# Patient Record
Sex: Male | Born: 1940 | Race: White | Hispanic: No | Marital: Married | Smoking: Former smoker
Health system: Southern US, Community
[De-identification: ages and names within clinical notes are randomized; demographics above are authoritative.]

## PROBLEM LIST (undated history)

## (undated) DIAGNOSIS — A419 Sepsis, unspecified organism: Secondary | ICD-10-CM

## (undated) DIAGNOSIS — I509 Heart failure, unspecified: Secondary | ICD-10-CM

## (undated) DIAGNOSIS — N1330 Unspecified hydronephrosis: Secondary | ICD-10-CM

## (undated) DIAGNOSIS — J101 Influenza due to other identified influenza virus with other respiratory manifestations: Secondary | ICD-10-CM

## (undated) DIAGNOSIS — R1310 Dysphagia, unspecified: Secondary | ICD-10-CM

## (undated) DIAGNOSIS — R197 Diarrhea, unspecified: Secondary | ICD-10-CM

## (undated) DIAGNOSIS — N10 Acute tubulo-interstitial nephritis: Secondary | ICD-10-CM

## (undated) DIAGNOSIS — N200 Calculus of kidney: Secondary | ICD-10-CM

## (undated) DIAGNOSIS — N179 Acute kidney failure, unspecified: Secondary | ICD-10-CM

## (undated) DIAGNOSIS — J189 Pneumonia, unspecified organism: Secondary | ICD-10-CM

## (undated) DIAGNOSIS — R6521 Severe sepsis with septic shock: Secondary | ICD-10-CM

## (undated) DIAGNOSIS — I82409 Acute embolism and thrombosis of unspecified deep veins of unspecified lower extremity: Secondary | ICD-10-CM

## (undated) DIAGNOSIS — E876 Hypokalemia: Secondary | ICD-10-CM

## (undated) DIAGNOSIS — S37009A Unspecified injury of unspecified kidney, initial encounter: Secondary | ICD-10-CM

## (undated) DIAGNOSIS — A0472 Enterocolitis due to Clostridium difficile, not specified as recurrent: Secondary | ICD-10-CM

## (undated) DIAGNOSIS — R4182 Altered mental status, unspecified: Secondary | ICD-10-CM

## (undated) DIAGNOSIS — E119 Type 2 diabetes mellitus without complications: Secondary | ICD-10-CM

## (undated) DIAGNOSIS — I10 Essential (primary) hypertension: Secondary | ICD-10-CM

## (undated) DIAGNOSIS — M549 Dorsalgia, unspecified: Secondary | ICD-10-CM

## (undated) DIAGNOSIS — K5900 Constipation, unspecified: Secondary | ICD-10-CM

## (undated) DIAGNOSIS — F0390 Unspecified dementia without behavioral disturbance: Secondary | ICD-10-CM

## (undated) HISTORY — PX: BACK SURGERY: SHX140

---

## 2013-01-05 ENCOUNTER — Emergency Department (HOSPITAL_COMMUNITY): Payer: Medicare Other

## 2013-01-05 ENCOUNTER — Inpatient Hospital Stay (HOSPITAL_COMMUNITY)
Admission: EM | Admit: 2013-01-05 | Discharge: 2013-01-13 | DRG: 871 | Disposition: A | Discharge status: Skilled Nursing Facility | Payer: Medicare Other | Attending: Family Medicine | Admitting: Family Medicine

## 2013-01-05 ENCOUNTER — Encounter (HOSPITAL_COMMUNITY): Payer: Medicare Other | Admitting: Certified Registered Nurse Anesthetist

## 2013-01-05 ENCOUNTER — Inpatient Hospital Stay (HOSPITAL_COMMUNITY): Payer: Medicare Other | Admitting: Certified Registered Nurse Anesthetist

## 2013-01-05 ENCOUNTER — Inpatient Hospital Stay (HOSPITAL_COMMUNITY): Payer: Medicare Other

## 2013-01-05 ENCOUNTER — Encounter (HOSPITAL_COMMUNITY)
Admission: EM | Disposition: A | Discharge status: Skilled Nursing Facility | Payer: Self-pay | Source: Home / Self Care | Attending: Internal Medicine

## 2013-01-05 ENCOUNTER — Encounter (HOSPITAL_COMMUNITY): Payer: Self-pay | Admitting: Emergency Medicine

## 2013-01-05 DIAGNOSIS — I509 Heart failure, unspecified: Secondary | ICD-10-CM | POA: Diagnosis present

## 2013-01-05 DIAGNOSIS — J9600 Acute respiratory failure, unspecified whether with hypoxia or hypercapnia: Secondary | ICD-10-CM | POA: Diagnosis present

## 2013-01-05 DIAGNOSIS — E872 Acidosis, unspecified: Secondary | ICD-10-CM | POA: Diagnosis present

## 2013-01-05 DIAGNOSIS — R197 Diarrhea, unspecified: Secondary | ICD-10-CM

## 2013-01-05 DIAGNOSIS — R1310 Dysphagia, unspecified: Secondary | ICD-10-CM | POA: Diagnosis present

## 2013-01-05 DIAGNOSIS — I5041 Acute combined systolic (congestive) and diastolic (congestive) heart failure: Secondary | ICD-10-CM

## 2013-01-05 DIAGNOSIS — I82409 Acute embolism and thrombosis of unspecified deep veins of unspecified lower extremity: Secondary | ICD-10-CM

## 2013-01-05 DIAGNOSIS — N200 Calculus of kidney: Secondary | ICD-10-CM

## 2013-01-05 DIAGNOSIS — N10 Acute tubulo-interstitial nephritis: Secondary | ICD-10-CM

## 2013-01-05 DIAGNOSIS — R6520 Severe sepsis without septic shock: Secondary | ICD-10-CM | POA: Diagnosis present

## 2013-01-05 DIAGNOSIS — Z881 Allergy status to other antibiotic agents status: Secondary | ICD-10-CM

## 2013-01-05 DIAGNOSIS — N179 Acute kidney failure, unspecified: Secondary | ICD-10-CM

## 2013-01-05 DIAGNOSIS — Z7901 Long term (current) use of anticoagulants: Secondary | ICD-10-CM

## 2013-01-05 DIAGNOSIS — N1330 Unspecified hydronephrosis: Secondary | ICD-10-CM

## 2013-01-05 DIAGNOSIS — Z96619 Presence of unspecified artificial shoulder joint: Secondary | ICD-10-CM

## 2013-01-05 DIAGNOSIS — I82509 Chronic embolism and thrombosis of unspecified deep veins of unspecified lower extremity: Secondary | ICD-10-CM | POA: Diagnosis present

## 2013-01-05 DIAGNOSIS — I5031 Acute diastolic (congestive) heart failure: Secondary | ICD-10-CM

## 2013-01-05 DIAGNOSIS — A0472 Enterocolitis due to Clostridium difficile, not specified as recurrent: Secondary | ICD-10-CM

## 2013-01-05 DIAGNOSIS — I129 Hypertensive chronic kidney disease with stage 1 through stage 4 chronic kidney disease, or unspecified chronic kidney disease: Secondary | ICD-10-CM | POA: Diagnosis present

## 2013-01-05 DIAGNOSIS — Z880 Allergy status to penicillin: Secondary | ICD-10-CM

## 2013-01-05 DIAGNOSIS — J101 Influenza due to other identified influenza virus with other respiratory manifestations: Secondary | ICD-10-CM

## 2013-01-05 DIAGNOSIS — I5042 Chronic combined systolic (congestive) and diastolic (congestive) heart failure: Secondary | ICD-10-CM | POA: Diagnosis present

## 2013-01-05 DIAGNOSIS — R4182 Altered mental status, unspecified: Secondary | ICD-10-CM

## 2013-01-05 DIAGNOSIS — G9341 Metabolic encephalopathy: Secondary | ICD-10-CM | POA: Diagnosis present

## 2013-01-05 DIAGNOSIS — N12 Tubulo-interstitial nephritis, not specified as acute or chronic: Secondary | ICD-10-CM | POA: Diagnosis present

## 2013-01-05 DIAGNOSIS — J189 Pneumonia, unspecified organism: Secondary | ICD-10-CM

## 2013-01-05 DIAGNOSIS — F0390 Unspecified dementia without behavioral disturbance: Secondary | ICD-10-CM | POA: Diagnosis present

## 2013-01-05 DIAGNOSIS — E876 Hypokalemia: Secondary | ICD-10-CM

## 2013-01-05 DIAGNOSIS — N189 Chronic kidney disease, unspecified: Secondary | ICD-10-CM

## 2013-01-05 DIAGNOSIS — R509 Fever, unspecified: Secondary | ICD-10-CM

## 2013-01-05 DIAGNOSIS — E1129 Type 2 diabetes mellitus with other diabetic kidney complication: Secondary | ICD-10-CM

## 2013-01-05 DIAGNOSIS — Z87891 Personal history of nicotine dependence: Secondary | ICD-10-CM

## 2013-01-05 DIAGNOSIS — A419 Sepsis, unspecified organism: Principal | ICD-10-CM

## 2013-01-05 DIAGNOSIS — N058 Unspecified nephritic syndrome with other morphologic changes: Secondary | ICD-10-CM

## 2013-01-05 DIAGNOSIS — J1100 Influenza due to unidentified influenza virus with unspecified type of pneumonia: Secondary | ICD-10-CM | POA: Diagnosis present

## 2013-01-05 DIAGNOSIS — N201 Calculus of ureter: Secondary | ICD-10-CM | POA: Diagnosis present

## 2013-01-05 DIAGNOSIS — N183 Chronic kidney disease, stage 3 unspecified: Secondary | ICD-10-CM | POA: Diagnosis present

## 2013-01-05 DIAGNOSIS — R1319 Other dysphagia: Secondary | ICD-10-CM

## 2013-01-05 DIAGNOSIS — Z66 Do not resuscitate: Secondary | ICD-10-CM | POA: Diagnosis present

## 2013-01-05 HISTORY — DX: Dorsalgia, unspecified: M54.9

## 2013-01-05 HISTORY — PX: CYSTOSCOPY W/ URETERAL STENT PLACEMENT: SHX1429

## 2013-01-05 HISTORY — DX: Type 2 diabetes mellitus without complications: E11.9

## 2013-01-05 HISTORY — DX: Unspecified injury of unspecified kidney, initial encounter: S37.009A

## 2013-01-05 HISTORY — DX: Constipation, unspecified: K59.00

## 2013-01-05 HISTORY — DX: Essential (primary) hypertension: I10

## 2013-01-05 HISTORY — DX: Acute embolism and thrombosis of unspecified deep veins of unspecified lower extremity: I82.409

## 2013-01-05 LAB — BASIC METABOLIC PANEL
CO2: 18 mEq/L — ABNORMAL LOW (ref 19–32)
Calcium: 8 mg/dL — ABNORMAL LOW (ref 8.4–10.5)
GFR calc non Af Amer: 20 mL/min — ABNORMAL LOW (ref >90)
Potassium: 4.9 mEq/L (ref 3.7–5.3)
Sodium: 135 mEq/L — ABNORMAL LOW (ref 137–147)

## 2013-01-05 LAB — CARBOXYHEMOGLOBIN
Carboxyhemoglobin: 1 % (ref 0.5–1.5)
Methemoglobin: 1.9 % — ABNORMAL HIGH (ref 0.0–1.5)
O2 Saturation: 72.2 %
Total hemoglobin: 9.8 g/dL — ABNORMAL LOW (ref 13.5–18.0)

## 2013-01-05 LAB — CBC
HCT: 35.9 % — ABNORMAL LOW (ref 39.0–52.0)
Hemoglobin: 11.8 g/dL — ABNORMAL LOW (ref 13.0–17.0)
Hemoglobin: 9.7 g/dL — ABNORMAL LOW (ref 13.0–17.0)
MCV: 88.9 fL (ref 78.0–100.0)
Platelets: 127 10*3/uL — ABNORMAL LOW (ref 150–400)
Platelets: 100 10*3/uL — ABNORMAL LOW (ref 150–400)
RBC: 4.04 MIL/uL — ABNORMAL LOW (ref 4.22–5.81)
RBC: 3.38 MIL/uL — ABNORMAL LOW (ref 4.22–5.81)
WBC: 6 10*3/uL (ref 4.0–10.5)
WBC: 21 10*3/uL — ABNORMAL HIGH (ref 4.0–10.5)

## 2013-01-05 LAB — STREP PNEUMONIAE URINARY ANTIGEN: Strep Pneumo Urinary Antigen: NEGATIVE

## 2013-01-05 LAB — POCT I-STAT, CHEM 8
BUN: 47 mg/dL — ABNORMAL HIGH (ref 6–23)
Chloride: 100 mEq/L (ref 96–112)
Creatinine, Ser: 2.9 mg/dL — ABNORMAL HIGH (ref 0.50–1.35)
Glucose, Bld: 156 mg/dL — ABNORMAL HIGH (ref 70–99)
Hemoglobin: 12.9 g/dL — ABNORMAL LOW (ref 13.0–17.0)
Potassium: 4 mEq/L (ref 3.7–5.3)
Sodium: 137 mEq/L (ref 137–147)
TCO2: 21 mmol/L (ref 0–100)

## 2013-01-05 LAB — COMPREHENSIVE METABOLIC PANEL
ALT: 11 U/L (ref 0–53)
AST: 16 U/L (ref 0–37)
BUN: 49 mg/dL — ABNORMAL HIGH (ref 6–23)
CO2: 21 mEq/L (ref 19–32)
Chloride: 95 mEq/L — ABNORMAL LOW (ref 96–112)
Creatinine, Ser: 2.71 mg/dL — ABNORMAL HIGH (ref 0.50–1.35)
GFR calc non Af Amer: 22 mL/min — ABNORMAL LOW (ref >90)
Sodium: 133 mEq/L — ABNORMAL LOW (ref 137–147)
Total Bilirubin: 0.5 mg/dL (ref 0.3–1.2)

## 2013-01-05 LAB — URINE MICROSCOPIC-ADD ON

## 2013-01-05 LAB — URINALYSIS, ROUTINE W REFLEX MICROSCOPIC
Bilirubin Urine: NEGATIVE
Protein, ur: 100 mg/dL — AB
Urobilinogen, UA: 1 mg/dL (ref 0.0–1.0)

## 2013-01-05 LAB — INFLUENZA PANEL BY PCR (TYPE A & B): Influenza A By PCR: POSITIVE — AB

## 2013-01-05 LAB — GLUCOSE, CAPILLARY
Glucose-Capillary: 204 mg/dL — ABNORMAL HIGH (ref 70–99)
Glucose-Capillary: 185 mg/dL — ABNORMAL HIGH (ref 70–99)
Glucose-Capillary: 185 mg/dL — ABNORMAL HIGH (ref 70–99)

## 2013-01-05 LAB — PRO B NATRIURETIC PEPTIDE: Pro B Natriuretic peptide (BNP): 8741 pg/mL — ABNORMAL HIGH (ref 0–125)

## 2013-01-05 LAB — CLOSTRIDIUM DIFFICILE BY PCR: Toxigenic C. Difficile by PCR: POSITIVE — AB

## 2013-01-05 LAB — MRSA PCR SCREENING: MRSA by PCR: NEGATIVE

## 2013-01-05 SURGERY — CYSTOSCOPY, WITH RETROGRADE PYELOGRAM AND URETERAL STENT INSERTION
Anesthesia: Monitor Anesthesia Care | Site: Ureter | Laterality: Left

## 2013-01-05 MED ORDER — OSELTAMIVIR PHOSPHATE 30 MG PO CAPS
30.0000 mg | ORAL_CAPSULE | Freq: Every day | ORAL | Status: DC
  Administered 2013-01-06 – 2013-01-07 (×2): 30 mg via ORAL
  Filled 2013-01-05 (×2): qty 1

## 2013-01-05 MED ORDER — SODIUM CHLORIDE 0.9 % IR SOLN
Status: DC | PRN
  Administered 2013-01-05: 3000 mL

## 2013-01-05 MED ORDER — APIXABAN 5 MG PO TABS: 5.0000 mg | ORAL_TABLET | Freq: Two times a day (BID) | ORAL | Status: DC

## 2013-01-05 MED ORDER — METOPROLOL SUCCINATE ER 25 MG PO TB24: 37.5000 mg | ORAL_TABLET | Freq: Every day | ORAL | Status: DC

## 2013-01-05 MED ORDER — LEVOFLOXACIN IN D5W 750 MG/150ML IV SOLN
750.0000 mg | Freq: Once | INTRAVENOUS | Status: AC
  Administered 2013-01-05: 750 mg via INTRAVENOUS
  Filled 2013-01-05: qty 150

## 2013-01-05 MED ORDER — OSELTAMIVIR PHOSPHATE 75 MG PO CAPS
75.0000 mg | ORAL_CAPSULE | Freq: Two times a day (BID) | ORAL | Status: DC
  Administered 2013-01-05: 75 mg via ORAL
  Filled 2013-01-05 (×3): qty 1

## 2013-01-05 MED ORDER — SODIUM CHLORIDE 0.9 % IV SOLN
INTRAVENOUS | Status: DC | PRN
  Administered 2013-01-05: 12:00:00 via INTRAVENOUS

## 2013-01-05 MED ORDER — MORPHINE SULFATE 2 MG/ML IJ SOLN
1.0000 mg | INTRAMUSCULAR | Status: DC | PRN
  Administered 2013-01-05 – 2013-01-10 (×14): 1 mg via INTRAVENOUS
  Filled 2013-01-05 (×14): qty 1

## 2013-01-05 MED ORDER — IOHEXOL 300 MG/ML  SOLN
50.0000 mL | Freq: Once | INTRAMUSCULAR | Status: AC | PRN
  Administered 2013-01-05: 50 mL via ORAL

## 2013-01-05 MED ORDER — FUROSEMIDE 10 MG/ML IJ SOLN
20.0000 mg | Freq: Two times a day (BID) | INTRAMUSCULAR | Status: DC
  Administered 2013-01-05 – 2013-01-13 (×16): 20 mg via INTRAVENOUS
  Filled 2013-01-05 (×18): qty 2

## 2013-01-05 MED ORDER — KETAMINE HCL 10 MG/ML IJ SOLN
INTRAMUSCULAR | Status: AC
  Filled 2013-01-05: qty 1

## 2013-01-05 MED ORDER — MIDAZOLAM HCL 5 MG/5ML IJ SOLN
INTRAMUSCULAR | Status: DC | PRN
  Administered 2013-01-05 (×2): 0.5 mg via INTRAVENOUS
  Administered 2013-01-05: 1 mg via INTRAVENOUS

## 2013-01-05 MED ORDER — SODIUM CHLORIDE 0.9 % IV SOLN
INTRAVENOUS | Status: DC
  Administered 2013-01-05: 05:00:00 via INTRAVENOUS

## 2013-01-05 MED ORDER — VANCOMYCIN HCL 10 G IV SOLR
2000.0000 mg | Freq: Once | INTRAVENOUS | Status: AC
  Administered 2013-01-05: 2000 mg via INTRAVENOUS
  Filled 2013-01-05: qty 2000

## 2013-01-05 MED ORDER — SODIUM CHLORIDE 0.9 % IV BOLUS (SEPSIS): 1000.0000 mL | INTRAVENOUS | Status: DC | PRN

## 2013-01-05 MED ORDER — MIDAZOLAM HCL 2 MG/2ML IJ SOLN
INTRAMUSCULAR | Status: AC
  Filled 2013-01-05: qty 2

## 2013-01-05 MED ORDER — SODIUM CHLORIDE 0.9 % IV BOLUS (SEPSIS)
1000.0000 mL | Freq: Once | INTRAVENOUS | Status: AC
  Administered 2013-01-05: 1000 mL via INTRAVENOUS

## 2013-01-05 MED ORDER — INSULIN ASPART 100 UNIT/ML ~~LOC~~ SOLN
0.0000 [IU] | Freq: Three times a day (TID) | SUBCUTANEOUS | Status: DC
  Administered 2013-01-05 (×2): 3 [IU] via SUBCUTANEOUS
  Administered 2013-01-06: 2 [IU] via SUBCUTANEOUS
  Administered 2013-01-07: 3 [IU] via SUBCUTANEOUS
  Administered 2013-01-08: 5 [IU] via SUBCUTANEOUS
  Administered 2013-01-08: 3 [IU] via SUBCUTANEOUS

## 2013-01-05 MED ORDER — BIOTENE DRY MOUTH MT LIQD
15.0000 mL | Freq: Two times a day (BID) | OROMUCOSAL | Status: DC
  Administered 2013-01-06 – 2013-01-13 (×10): 15 mL via OROMUCOSAL

## 2013-01-05 MED ORDER — 0.9 % SODIUM CHLORIDE (POUR BTL) OPTIME
TOPICAL | Status: DC | PRN
  Administered 2013-01-05: 1000 mL

## 2013-01-05 MED ORDER — DEXTROSE 5 % IV SOLN
1.0000 g | Freq: Three times a day (TID) | INTRAVENOUS | Status: AC
  Administered 2013-01-05 – 2013-01-13 (×24): 1 g via INTRAVENOUS
  Filled 2013-01-05 (×26): qty 1

## 2013-01-05 MED ORDER — METRONIDAZOLE IN NACL 5-0.79 MG/ML-% IV SOLN
500.0000 mg | Freq: Three times a day (TID) | INTRAVENOUS | Status: DC
  Administered 2013-01-05: 500 mg via INTRAVENOUS
  Filled 2013-01-05 (×2): qty 100

## 2013-01-05 MED ORDER — LIDOCAINE HCL 2 % EX GEL
CUTANEOUS | Status: DC | PRN
  Administered 2013-01-05: 1

## 2013-01-05 MED ORDER — ONDANSETRON HCL 4 MG/2ML IJ SOLN
INTRAMUSCULAR | Status: AC
  Filled 2013-01-05: qty 2

## 2013-01-05 MED ORDER — ONDANSETRON HCL 4 MG/2ML IJ SOLN: 4.0000 mg | Freq: Four times a day (QID) | INTRAMUSCULAR | Status: DC | PRN

## 2013-01-05 MED ORDER — SODIUM CHLORIDE 0.9 % IV SOLN
INTRAVENOUS | Status: DC
  Administered 2013-01-05: 11:00:00 via INTRAVENOUS

## 2013-01-05 MED ORDER — ONDANSETRON HCL 4 MG/2ML IJ SOLN
INTRAMUSCULAR | Status: DC | PRN
  Administered 2013-01-05: 4 mg via INTRAVENOUS

## 2013-01-05 MED ORDER — DEXAMETHASONE SODIUM PHOSPHATE 10 MG/ML IJ SOLN
INTRAMUSCULAR | Status: AC
  Filled 2013-01-05: qty 1

## 2013-01-05 MED ORDER — ONDANSETRON HCL 4 MG PO TABS: 4.0000 mg | ORAL_TABLET | Freq: Four times a day (QID) | ORAL | Status: DC | PRN

## 2013-01-05 MED ORDER — CHLORHEXIDINE GLUCONATE 0.12 % MT SOLN
15.0000 mL | Freq: Two times a day (BID) | OROMUCOSAL | Status: DC
  Administered 2013-01-05 – 2013-01-13 (×12): 15 mL via OROMUCOSAL
  Filled 2013-01-05 (×18): qty 15

## 2013-01-05 MED ORDER — VANCOMYCIN HCL 500 MG IV SOLR
500.0000 mg | Freq: Four times a day (QID) | Status: DC
  Filled 2013-01-05 (×3): qty 500

## 2013-01-05 MED ORDER — LACTATED RINGERS IV SOLN: INTRAVENOUS | Status: DC | PRN

## 2013-01-05 MED ORDER — IOHEXOL 300 MG/ML  SOLN
INTRAMUSCULAR | Status: DC | PRN
  Administered 2013-01-05: 18 mL

## 2013-01-05 MED ORDER — ONDANSETRON HCL 4 MG/2ML IJ SOLN
4.0000 mg | Freq: Once | INTRAMUSCULAR | Status: AC
Start: 2013-01-05 — End: 2013-01-05
  Administered 2013-01-05: 4 mg via INTRAVENOUS
  Filled 2013-01-05: qty 2

## 2013-01-05 MED ORDER — VANCOMYCIN 50 MG/ML ORAL SOLUTION
500.0000 mg | Freq: Four times a day (QID) | ORAL | Status: DC
  Administered 2013-01-05 – 2013-01-10 (×21): 500 mg via ORAL
  Filled 2013-01-05 (×26): qty 10

## 2013-01-05 MED ORDER — ACETAMINOPHEN 650 MG RE SUPP
650.0000 mg | Freq: Once | RECTAL | Status: AC
  Administered 2013-01-05: 650 mg via RECTAL
  Filled 2013-01-05: qty 1

## 2013-01-05 MED ORDER — METOCLOPRAMIDE HCL 5 MG/ML IJ SOLN
INTRAMUSCULAR | Status: DC | PRN
  Administered 2013-01-05: 10 mg via INTRAVENOUS

## 2013-01-05 MED ORDER — AZTREONAM 2 G IJ SOLR
2.0000 g | INTRAMUSCULAR | Status: AC
  Administered 2013-01-05: 2 g via INTRAVENOUS
  Filled 2013-01-05: qty 2

## 2013-01-05 MED ORDER — NOREPINEPHRINE BITARTRATE 1 MG/ML IJ SOLN
5.0000 ug/min | INTRAVENOUS | Status: DC
  Administered 2013-01-05: 8 ug/min via INTRAVENOUS
  Filled 2013-01-05: qty 4

## 2013-01-05 MED ORDER — DEXAMETHASONE SODIUM PHOSPHATE 4 MG/ML IJ SOLN
INTRAMUSCULAR | Status: DC | PRN
  Administered 2013-01-05: 8 mg via INTRAVENOUS

## 2013-01-05 MED ORDER — METOCLOPRAMIDE HCL 5 MG/ML IJ SOLN
INTRAMUSCULAR | Status: AC
  Filled 2013-01-05: qty 2

## 2013-01-05 MED ORDER — SODIUM CHLORIDE 0.9 % IV SOLN: INTRAVENOUS | Status: DC

## 2013-01-05 MED ORDER — METRONIDAZOLE IN NACL 5-0.79 MG/ML-% IV SOLN
500.0000 mg | Freq: Three times a day (TID) | INTRAVENOUS | Status: DC
  Administered 2013-01-05 – 2013-01-10 (×14): 500 mg via INTRAVENOUS
  Filled 2013-01-05 (×18): qty 100

## 2013-01-05 MED ORDER — PANTOPRAZOLE SODIUM 40 MG IV SOLR
40.0000 mg | INTRAVENOUS | Status: DC
  Administered 2013-01-05 – 2013-01-06 (×2): 40 mg via INTRAVENOUS
  Filled 2013-01-05 (×3): qty 40

## 2013-01-05 MED ORDER — LIDOCAINE HCL 2 % EX GEL
CUTANEOUS | Status: AC
  Filled 2013-01-05: qty 10

## 2013-01-05 MED ORDER — PROPOFOL 10 MG/ML IV BOLUS
INTRAVENOUS | Status: AC
  Filled 2013-01-05: qty 20

## 2013-01-05 MED ORDER — PROPOFOL 10 MG/ML IV BOLUS
INTRAVENOUS | Status: DC | PRN
  Administered 2013-01-05: 30 mg via INTRAVENOUS
  Administered 2013-01-05: 20 mg via INTRAVENOUS
  Administered 2013-01-05: 30 mg via INTRAVENOUS
  Administered 2013-01-05: 20 mg via INTRAVENOUS

## 2013-01-05 MED ORDER — KETAMINE HCL 10 MG/ML IJ SOLN
INTRAMUSCULAR | Status: DC | PRN
  Administered 2013-01-05: 30 mg via INTRAVENOUS
  Administered 2013-01-05 (×3): 20 mg via INTRAVENOUS
  Administered 2013-01-05: 10 mg via INTRAVENOUS

## 2013-01-05 SURGICAL SUPPLY — 16 items
BAG URINE DRAINAGE (UROLOGICAL SUPPLIES) ×2 IMPLANT
BASKET ZERO TIP NITINOL 2.4FR (BASKET) IMPLANT
CATH INTERMIT  6FR 70CM (CATHETERS) ×2 IMPLANT
DRAPE CAMERA CLOSED 9X96 (DRAPES) ×2 IMPLANT
FIBER LASER FLEXIVA 200 (UROLOGICAL SUPPLIES) IMPLANT
FIBER LASER FLEXIVA 365 (UROLOGICAL SUPPLIES) IMPLANT
GLOVE BIOGEL M STRL SZ7.5 (GLOVE) ×2 IMPLANT
GOWN STRL REIN XL XLG (GOWN DISPOSABLE) IMPLANT
GUIDE WIRE 2.5MM (WIRE) ×2
GUIDEWIRE .038 (WIRE) ×2 IMPLANT
GUIDEWIRE 2.5MM (WIRE) ×1 IMPLANT
GUIDEWIRE ANG ZIPWIRE 038X150 (WIRE) ×2 IMPLANT
GUIDEWIRE STR DUAL SENSOR (WIRE) ×2 IMPLANT
IV NS IRRIG 3000ML ARTHROMATIC (IV SOLUTION) ×2 IMPLANT
PACK CYSTO (CUSTOM PROCEDURE TRAY) ×2 IMPLANT
STENT CONTOUR 7FRX24 (STENTS) ×2 IMPLANT

## 2013-01-05 NOTE — Progress Notes (Signed)
Patient came straight from ICU to OR due to emergency case, unable to assess patient marched armband name, birth date, and MD with chart.

## 2013-01-05 NOTE — Op Note (Signed)
Preoperative diagnosis: Left ureteral calculus with urosepsis Postoperative diagnosis: Same  Procedure: Cystoscopy, left retrograde pyelogram and left double-J stent placement 7 French x24 cm   Surgeon: Valetta Fuller M.D.  Anesthesia: Gen.  Indications: Patient presented with fever, mental status changes and hypotension. He is felt to have urosepsis. Stone particle CT showed a 7 mm left midureteral stone. We folded critical at the patient had decompression of the system in order to allow for any chance of improvement for his systemic septic syndrome. This was discussed with the patient's wife.     Technique and findings: Patient was brought the operating room. He received some IV sedation. He was placed in the lithotomy position and prepped and draped in usual manner. Cystoscopy revealed cloudy urine but no other pathology. Retrograde polygrams confirmed a filling defect with high-grade obstruction and quite a tortuous left ureter. This was done with an open-ended catheter. Fluoroscopic interpretation was utilized.   At the completion of retrograde pyelography a guidewire was placed in the left renal pelvis. Given the ureteral tortuosity this was somewhat difficult we were able to get access. We then placed a 7 French 24 cm double-J stent. Marked cloudy/grossly purulent urine was obtained which appeared to be under high pressure. A new Foley catheter was placed and the patient will be brought back to the intensive. It.

## 2013-01-05 NOTE — Anesthesia Preprocedure Evaluation (Signed)
Anesthesia Evaluation  Patient identified by MRN, date of birth, ID band Patient awake  General Assessment Comment:Urosepsis with CV compromise  On pressors  Reviewed: Allergy & Precautions, H&P , NPO status , Patient's Chart, lab work & pertinent test results  Airway Mallampati: II TM Distance: >3 FB Neck ROM: Full    Dental no notable dental hx.    Pulmonary neg pulmonary ROS, former smoker,  breath sounds clear to auscultation  Pulmonary exam normal       Cardiovascular hypertension, Pt. on medications and Pt. on home beta blockers DVT Rhythm:Regular Rate:Tachycardia     Neuro/Psych negative neurological ROS  negative psych ROS   GI/Hepatic negative GI ROS, Neg liver ROS,   Endo/Other  diabetes  Renal/GU ARF and Renal InsufficiencyRenal disease  negative genitourinary   Musculoskeletal negative musculoskeletal ROS (+)   Abdominal   Peds negative pediatric ROS (+)  Hematology negative hematology ROS (+)   Anesthesia Other Findings   Reproductive/Obstetrics negative OB ROS                           Anesthesia Physical Anesthesia Plan  ASA: IV and emergent  Anesthesia Plan: MAC   Post-op Pain Management:    Induction:   Airway Management Planned: Simple Face Mask  Additional Equipment:   Intra-op Plan:   Post-operative Plan:   Informed Consent: I have reviewed the patients History and Physical, chart, labs and discussed the procedure including the risks, benefits and alternatives for the proposed anesthesia with the patient or authorized representative who has indicated his/her understanding and acceptance.   Dental advisory given  Plan Discussed with: CRNA  Anesthesia Plan Comments:         Anesthesia Quick Evaluation

## 2013-01-05 NOTE — Consult Note (Addendum)
Name: Henry Wyatt MRN: [id number] DOB: 09-12-1940    ADMISSION DATE:  01/05/2013 CONSULTATION DATE:  01/05/2013   REFERRING MD :  edp PRIMARY SERVICE: trh  CHIEF COMPLAINT:  Shock abd pain  BRIEF PATIENT DESCRIPTION:  72 y.o.M adm from snf with abd pain, septic shock  SIGNIFICANT EVENTS / STUDIES:  ABD pelvic CT 12/31:  L ureter stone with hydronephrosis  LINES / TUBES: PIV  CULTURES: BC x 2 12/31 UC 12/31 resp cult 12/31 C diff titer 12/31    ANTIBIOTICS: Aztreonam 12/31 Vanco 12/31 levaquin 12/31 tamiflu 12/31    HISTORY OF PRESENT ILLNESS:   72 y.o.M SNF resident adm with diarrhea, abd pain, dyspnea, hypotension, SIRS, L ureteral stone and hydro, PNA, fever, shock. CCM consulted.  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  Hx of HTN, CKD, DM 2.  Pt is confused and not able to give much hx.  No family present  See PMHx   PAST MEDICAL HISTORY :  Past Medical History  Diagnosis Date  . Hypertension   . Diabetes mellitus without complication   . Constipation   . DVT (deep venous thrombosis)   . Back pain   . Kidney injury    History reviewed. No pertinent past surgical history. Prior to Admission medications   Medication Sig Start Date End Date Taking? Authorizing Provider  acetaminophen (TYLENOL) 500 MG tablet Take 1,000 mg by mouth every 8 (eight) hours as needed for mild pain.   Yes Historical Provider, MD  albuterol (PROVENTIL) (2.5 MG/3ML) 0.083% nebulizer solution Take 2.5 mg by nebulization every 2 (two) hours as needed for wheezing or shortness of breath.   Yes Historical Provider, MD  apixaban (ELIQUIS) 5 MG TABS tablet Take 5 mg by mouth 2 (two) times daily.   Yes Historical Provider, MD  atorvastatin (LIPITOR) 20 MG tablet Take 20 mg by mouth daily.   Yes Historical Provider, MD  citalopram (CELEXA) 10 MG tablet Take 10 mg by mouth daily.   Yes Historical Provider, MD  cyanocobalamin 1000 MCG tablet Take 100 mcg by mouth daily.   Yes Historical  Provider, MD  divalproex (DEPAKOTE) 125 MG DR tablet Take 125 mg by mouth 3 (three) times daily.   Yes Historical Provider, MD  docusate sodium (COLACE) 100 MG capsule Take 100 mg by mouth 2 (two) times daily.   Yes Historical Provider, MD  folic acid (FOLVITE) 1 MG tablet Take 1 mg by mouth daily.   Yes Historical Provider, MD  insulin aspart (NOVOLOG) 100 UNIT/ML injection Inject 1-12 Units into the skin 3 (three) times daily before meals. Per sliding scale   Yes Historical Provider, MD  metoprolol succinate (TOPROL-XL) 25 MG 24 hr tablet Take 37.5 mg by mouth at bedtime.   Yes Historical Provider, MD  ondansetron (ZOFRAN) 4 MG tablet Take 4 mg by mouth every 8 (eight) hours as needed for nausea or vomiting.   Yes Historical Provider, MD  oxyCODONE (OXY IR/ROXICODONE) 5 MG immediate release tablet Take 5 mg by mouth every 6 (six) hours as needed for severe pain.   Yes Historical Provider, MD  pantoprazole (PROTONIX) 40 MG tablet Take 40 mg by mouth daily.   Yes Historical Provider, MD  polyethylene glycol (MIRALAX / GLYCOLAX) packet Take 17 g by mouth daily.   Yes Historical Provider, MD  valsartan (DIOVAN) 320 MG tablet Take 320 mg by mouth daily.   Yes Historical Provider, MD   Allergies  Allergen Reactions  . Carbapenems Other (See  Comments)    Per [facility] home [facility]  . Cephalosporins Other (See Comments)    Per [facility] home [facility]  . Erythromycin Other (See Comments)    Per [facility] home [facility]  . Penicillins Other (See Comments)    Per [facility] home [facility]  . Sulfa Antibiotics Other (See Comments)    Per nursing home [facility]    FAMILY HISTORY:  No family history on file. SOCIAL HISTORY:  reports that he has quit smoking. He does not have any smokeless tobacco history on file. He reports that he does not drink alcohol or use illicit drugs.  REVIEW OF SYSTEMS:  Not obtainable d/t mental status  SUBJECTIVE:   VITAL SIGNS: Temp:  [103.4 F (39.7 C)-104.5 F (40.3 C)] 103.8 F (39.9 C) (12/31  0659) Pulse Rate:  [101-106] 101 (12/31 0857) Resp:  [13-32] 13 (12/31 0857) BP: (72-115)/(37-85) 72/37 mmHg (12/31 0857) SpO2:  [92 %-99 %] 96 % (12/31 0857) Weight:  [102.1 kg (225 lb 1.4 oz)] 102.1 kg (225 lb 1.4 oz) (12/31 0857) HEMODYNAMICS: Shock state   VENTILATOR SETTINGS: [place] oxygen   INTAKE / OUTPUT: Intake/Output   None     PHYSICAL EXAMINATION: General:  Ill appearing WM disheveled  Neuro:  Altered ,confused HEENT:  Dry mucus membranes Cardiovascular:  Tachy nl s1 s2 no s3 s4 Lungs:  Rales at bases Abdomen:  Soft NT  Musculoskeletal:  From  Skin:  clear  LABS:  CBC  Recent Labs Lab 01/05/13 0440 01/05/13 0451  WBC 6.0  --   HGB 11.8* 12.9*  HCT 35.9* 38.0*  PLT 127*  --    Coag's No results found for this basename: APTT, INR,  in the last 168 hours BMET  Recent Labs Lab 01/05/13 0440 01/05/13 0451  NA 133* 137  K 3.9 4.0  CL 95* 100  CO2 21  --   BUN 49* 47*  CREATININE 2.71* 2.90*  GLUCOSE 156* 156*   Electrolytes  Recent Labs Lab 01/05/13 0440  CALCIUM 8.7   Sepsis Markers  Recent Labs Lab 01/05/13 0449  LATICACIDVEN 4.83*   ABG No results found for this basename: PHART, PCO2ART, PO2ART,  in the last 168 hours Liver Enzymes  Recent Labs Lab 01/05/13 0440  AST 16  ALT 11  ALKPHOS 166*  BILITOT 0.5  ALBUMIN 2.8*   Cardiac Enzymes No results found for this basename: TROPONINI, PROBNP,  in the last 168 hours Glucose  Recent Labs Lab 01/05/13 0900  GLUCAP 204*    Imaging Ct Abdomen Pelvis Wo Contrast  01/05/2013   CLINICAL DATA:  72 year old male with fever and abdominal pain. Renal insufficiency precludes IV contrast. Initial encounter.  EXAM: CT ABDOMEN AND PELVIS WITHOUT CONTRAST  TECHNIQUE: Multidetector CT imaging of the abdomen and pelvis was performed following the standard protocol without intravenous contrast.  COMPARISON:  Portable abdomen radiographs from the same day.  FINDINGS: Mild motion artifact.  Confluent left lower lobe and posterior lingula pulmonary opacity, somewhat beyond that typically seen for simple atelectasis. Cardiomegaly. No pericardial or pleural effusion. Mild atelectasis at the right lung base.  Chronic appearing right lower posterior rib fractures. Scoliosis and advanced degenerative changes in the spine. Previous lumbosacral junction posterior and interbody fusion. Previous partial laminectomy in the upper lumbar spine. No acute osseous abnormality identified.  Mild presacral stranding (series 2, image 77) contiguous with inflammation in the retroperitoneal space along the left pelvic sidewall tracking to the lower abdomen. Inflammation tracks along the left gutter and there is  thickening of the left para renal fascia.  There is in a regular 7 x 7 mm obstructing calculus in the left ureter at the L4-L5 disc level. There is upstream hydroureter and periureteral stranding. There is left hydronephrosis with a subtle fluid fluid level in the left hemipelvis (series 2, image 40). There are additional nonobstructing left intra renal calculi. Left perinephric stranding is moderate.  Beyond the obstruction, the distal left ureter appears normal. The bladder is within normal limits. Right nephrolithiasis without obstruction.  There is mild wall thickening of the distal colon, most pronounced in the sigmoid. There are sigmoid diverticula. Still, this segment is under distended. The left colon is also decompressed with diverticula.  More normal appearance of the transverse colon and right: . Appendix appears to be surgically absent. No dilated small bowel. Negative stomach and duodenum. Negative non contrast liver, gallbladder, spleen, pancreas and right adrenal gland. There is a 3 cm left adrenal low density nodule (12 Hounsfield units). No abdominal free fluid or free air.  IMPRESSION: 1. Acute obstructive uropathy on the left, with a 7 x 7 mm calculus at the L4-L5 disc level. Moderate to severe  perinephric and periureteric stranding plus a fluid-fluid level in the left renal pelvis (may reflect hematuria).  2. Stranding along the left pericolic gutter and retroperitoneal space tracking to the presacral space all felt to be related to #1. There is diverticulosis of the sigmoid and descending colon, but I do not favor superimposed colitis at this time.  3. Confluent opacity at the left lung base, favor atelectasis in light of #1, but developing left lung base infection difficult to exclude.  4.  Additional bilateral nephrolithiasis.  5. Low-density 3 cm left adrenal nodule, favor benign adenoma.   Electronically Signed   By: Augusto Gamble M.D.   On: 01/05/2013 08:56   Dg Chest Portable 1 View  01/05/2013   CLINICAL DATA:  Fever of 104.  Diarrhea and confusion.  EXAM: PORTABLE CHEST - 1 VIEW  COMPARISON:  None.  FINDINGS: Shallow inspiration. Heart size and pulmonary vascularity are probably normal for technique. There is suggestion of ulceration 0 left hemidiaphragm suggesting a small pleural effusion and basilar infiltration. Pneumonia could have this appearance. No apparent pneumothorax. Postoperative change in the right shoulder.  IMPRESSION: Technically limited study but suggestion of infiltration and effusion on the left. Changes likely represent left lower lobe pneumonia appear   Electronically Signed   By: Burman Nieves M.D.   On: 01/05/2013 04:03   Dg Abd Portable 1v  01/05/2013   CLINICAL DATA:  Fever, diarrhea, confusion.  EXAM: PORTABLE ABDOMEN - 1 VIEW  COMPARISON:  Technically limited study due to patient positioning.  FINDINGS: Paucity of gas in the abdomen. No small or large bowel distention is suggested. There are healing fractures of the right 11th and 12th ribs. Scoliosis and degenerative changes of the lumbar spine with postoperative changes in the lower lumbar spine. Pelvis appears intact.  IMPRESSION: Technically limited study. Relatively gasless abdomen without evidence of  obstruction.   Electronically Signed   By: Burman Nieves M.D.   On: 01/05/2013 04:07     CXR: bibasilar ATX/infiltrates  ASSESSMENT / PLAN:  PULMONARY A:Bibasilar infiltrate c/w HCAP (snf pt ) R/o flu P:   Oxygen titration  CARDIOVASCULAR A: septic shock mult sources:  Diarrhea, stone in L ureter with hydronephrosis, PNA P:  Septic shock protocol CVL  RENAL A:  AKI on CKD d/t sepsis and renal stone and hydroneph  P:   Urology consult ?nephrostomy tube. Note has been on eliquis for DVT IVF, vasopressors  GASTROINTESTINAL A:  diarrhea P:   IVF NPO chk c diff  HEMATOLOGIC A:  sepsis P:  montior as below  INFECTIOUS A:  Septic shock d/t stone dz hydronephrosis, diarrhea, HCAP P:   Agree with abx as orderd see dashboard Septic shock protocol  ENDOCRINE A:  DM2 with renal dz   P:   SSI   NEUROLOGIC A:  AMS d/t sepsis P:   Monitor  Note pt is DNI DNR  TODAY'S SUMMARY: 72 y.o.M with septic shock d/t renal stone hydroneph, HCAP, diarrhea, r/o flu.  Plan septic shock protocol, Abx, urology consult ?nephrostomy tube, DNR DNI, CVL  I have personally obtained a history, examined the patient, evaluated laboratory and imaging results, formulated the assessment and plan and placed orders. CRITICAL CARE: The patient is critically ill with multiple organ systems failure and requires high complexity decision making for assessment and support, frequent evaluation and titration of therapies, application of advanced monitoring technologies and extensive interpretation of multiple databases. Critical Care Time devoted to patient care services described in this note is 60 minutes.   I UPDATED THE SPOUSE AT BEDSIDE AM 01/05/13   Dorcas Carrow Beeper  [phone]  Cell  [phone]  If no response or cell goes to voicemail, call beeper [phone]  Pulmonary and Critical Care Medicine [facility] Pager: [phone]  01/05/2013, 9:16 AM

## 2013-01-05 NOTE — Progress Notes (Signed)
ANTIBIOTIC CONSULT NOTE - INITIAL  Pharmacy Consult for Vancomycin, antibiotic renal dose adj Indication: rule out sepsis  Allergies  Allergen Reactions  . Carbapenems Other (See Comments)    Per nursing home [facility]  . Cephalosporins Other (See Comments)    Per nursing home [facility]  . Erythromycin Other (See Comments)    Per nursing home [facility]  . Penicillins Other (See Comments)    Per nursing home [facility]  . Sulfa Antibiotics Other (See Comments)    Per nursing home [facility]    Patient Measurements:   Rn reports ht 25ft 1 in and weight 102.1kg  Vital Signs: Temp: 103.8 F (39.9 C) (12/31 0659) Temp src: Rectal (12/31 0659) BP: 100/50 mmHg (12/31 0410) Pulse Rate: 106 (12/31 0400)  Labs:  Recent Labs  01/05/13 0440 01/05/13 0451  WBC 6.0  --   HGB 11.8* 12.9*  PLT 127*  --   CREATININE 2.71* 2.90*   Estimated Creatinine Clearance: 28.5 ml/min (by C-G formula based on Cr of 2.9).  Microbiology: No results found for this or any previous visit (from the past 720 hour(s)).  Medical History: Past Medical History  Diagnosis Date  . Hypertension   . Diabetes mellitus without complication   . Constipation   . DVT (deep venous thrombosis)   . Back pain   . Kidney injury     Anti-infectives:  12/31 >> Levaquin >> 12/31 >> Aztreonam >> 12/31 >> Vancomycin >>  Assessment: 72 yo M admitted 12/31 from a nursing home with fever, nausea, multiple episodes of diarrhea, and change in mental status.  Concern for sepsis and LLL pneumonia.  Pharmacy is consulted to dose IV vancomycin and renally adjust other antibiotic doses.  Tmax: 104.5  WBCs: 6  Renal: SCr elevated at 2.9 with CrCl ~ 23 ml/min Normalized  Goal of Therapy:  Vancomycin trough level 15-20 mcg/ml  Plan:   Tamiflu 30mg  PO daily  Aztreonam 1g IV q8h  Vancomycin 2g IV x1, then further dosing based on renal function.  Measure Vanc trough at steady state.  Follow up renal fxn and culture results.   Lynann Beaver PharmD, BCPS Pager [phone] 01/05/2013 7:39 AM

## 2013-01-05 NOTE — Transfer of Care (Signed)
Immediate Anesthesia Transfer of Care Note  Patient: Henry Wyatt  Procedure(s) Performed: Procedure(s): CYSTOSCOPY WITH RETROGRADE PYELOGRAM/URETERAL STENT PLACEMENT (Left)  Patient Location: PACU  Anesthesia Type:MAC  Level of Consciousness: Patient easily awoken, sedated, comfortable, cooperative, following commands, responds to stimulation.   Airway & Oxygen Therapy: Patient spontaneously breathing, ventilating well, oxygen via simple oxygen mask.  Post-op Assessment: Report given to PACU RN, vital signs reviewed and stable, moving all extremities.   Post vital signs: Reviewed and stable.  Complications: No apparent anesthesia complications

## 2013-01-05 NOTE — Consult Note (Signed)
Urology Consult  Referring physician: Patrick Wright, M.D. Reason for referral: 7 mm left ureteral calculus with urosepsis  History of Present Illness: 72-year-old male who has recently resided in a skilled nursing facility who was noted to be febrile to 104 with significant mental status changes and hypotension. He's been assessed in the emergency room and now is on the cortical or service. He appears to clinically have probable urosepsis. Stone protocol CT revealed a 7 mm obstructing stone in the left mid ureter. The patient has elevated creatinine. He is  unable to provide any additional history. By phone his wife reports to me that he has had a few clinical stone event sac but none recently and has not required any surgical intervention. He does not appear to have any other active urology problems. The patient is currently being stabilized by critical care medicine is to be started on pressor support. A central line as planned to be placed in the near future. The patient remains critically ill. He is on broad-spectrum antibiotics.  Past Medical History  Diagnosis Date  . Hypertension   . Diabetes mellitus without complication   . Constipation   . DVT (deep venous thrombosis)   . Back pain   . Kidney injury    History reviewed. No pertinent past surgical history.  Medications:  Scheduled: . aztreonam  1 g Intravenous Q8H  . insulin aspart  0-15 Units Subcutaneous TID WC  . [START ON 01/06/2013] oseltamivir  30 mg Oral Daily  . pantoprazole (PROTONIX) IV  40 mg Intravenous Q24H  . sodium chloride  1,000 mL Intravenous Once  . vancomycin  2,000 mg Intravenous Once    Allergies:  Allergies  Allergen Reactions  . Carbapenems Other (See Comments)    Per nursing home [facility]  . Cephalosporins Other (See Comments)    Per nursing home [facility]  . Erythromycin Other (See Comments)    Per nursing home [facility]  . Penicillins Other (See Comments)    Per nursing home [facility]  . Sulfa Antibiotics Other  (See Comments)    Per nursing home [facility]    No family history on file.  Social History:  reports that he has quit smoking. He does not have any smokeless tobacco history on file. He reports that he does not drink alcohol or use illicit drugs.  ROS not obtainable.  Physical Exam:  Vital signs in last 24 hours: Temp:  [103.4 F (39.7 C)-104.5 F (40.3 C)] 103.8 F (39.9 C) (12/31 0659) Pulse Rate:  [100-106] 100 (12/31 0900) Resp:  [13-32] 18 (12/31 0900) BP: (70-115)/(37-85) 70/39 mmHg (12/31 0900) SpO2:  [92 %-99 %] 95 % (12/31 0900) Weight:  [225 lb 1.4 oz (102.1 kg)] 225 lb 1.4 oz (102.1 kg) (12/31 0857)  Constitutional: Vital signs reviewed. WD WN the patient is arousable. He is able to tell me his name. Head: Normocephalic and atraumatic   Eyes: PERRL, No scleral icterus.  Neck: Supple No  Gross JVD, mass, thyromegaly, or carotid bruit present.  Cardiovascular: RRR Pulmonary/Chest: Normal effort Abdominal: Soft. Obese. Questionable left-sided CVA tenderness. No evidence of surgical abdomen. Genitourinary: Indwelling Foley catheter draining small amount of cloudy urine. Extremities: No cyanosis or edema  Neurological: Grossly non-focal.  Skin: Warm,very dry and intact. No rash, cyanosis   Laboratory Data:  Results for orders placed during the hospital encounter of 01/05/13 (from the past 72 hour(s))  CBC     Status: Abnormal   Collection Time    01/05/13  4:40 AM        Result Value Range   WBC 6.0  4.0 - 10.5 K/uL   RBC 4.04 (*) 4.22 - 5.81 MIL/uL   Hemoglobin 11.8 (*) 13.0 - 17.0 g/dL   HCT 35.9 (*) 39.0 - 52.0 %   MCV 88.9  78.0 - 100.0 fL   MCH 29.2  26.0 - 34.0 pg   MCHC 32.9  30.0 - 36.0 g/dL   RDW 15.4  11.5 - 15.5 %   Platelets 127 (*) 150 - 400 K/uL  COMPREHENSIVE METABOLIC PANEL     Status: Abnormal   Collection Time    01/05/13  4:40 AM      Result Value Range   Sodium 133 (*) 137 - 147 mEq/L   Comment: Please note change in reference range.    Potassium 3.9  3.7 - 5.3 mEq/L   Comment: Please note change in reference range.   Chloride 95 (*) 96 - 112 mEq/L   CO2 21  19 - 32 mEq/L   Glucose, Bld 156 (*) 70 - 99 mg/dL   BUN 49 (*) 6 - 23 mg/dL   Creatinine, Ser 2.71 (*) 0.50 - 1.35 mg/dL   Calcium 8.7  8.4 - 10.5 mg/dL   Total Protein 6.4  6.0 - 8.3 g/dL   Albumin 2.8 (*) 3.5 - 5.2 g/dL   AST 16  0 - 37 U/L   ALT 11  0 - 53 U/L   Alkaline Phosphatase 166 (*) 39 - 117 U/L   Total Bilirubin 0.5  0.3 - 1.2 mg/dL   GFR calc non Af Amer 22 (*) >90 mL/min   GFR calc Af Amer 25 (*) >90 mL/min   Comment: (NOTE)     The eGFR has been calculated using the CKD EPI equation.     This calculation has not been validated in all clinical situations.     eGFR's persistently <90 mL/min signify possible Chronic Kidney     Disease.  CG4 I-STAT (LACTIC ACID)     Status: Abnormal   Collection Time    01/05/13  4:49 AM      Result Value Range   Lactic Acid, Venous 4.83 (*) 0.5 - 2.2 mmol/L  POCT I-STAT, CHEM 8     Status: Abnormal   Collection Time    01/05/13  4:51 AM      Result Value Range   Sodium 137  137 - 147 mEq/L   Potassium 4.0  3.7 - 5.3 mEq/L   Chloride 100  96 - 112 mEq/L   BUN 47 (*) 6 - 23 mg/dL   Creatinine, Ser 2.90 (*) 0.50 - 1.35 mg/dL   Glucose, Bld 156 (*) 70 - 99 mg/dL   Calcium, Ion 1.14  1.13 - 1.30 mmol/L   TCO2 21  0 - 100 mmol/L   Hemoglobin 12.9 (*) 13.0 - 17.0 g/dL   HCT 38.0 (*) 39.0 - 52.0 %  INFLUENZA PANEL BY PCR     Status: Abnormal   Collection Time    01/05/13  5:14 AM      Result Value Range   Influenza A By PCR POSITIVE (*) NEGATIVE   Influenza B By PCR NEGATIVE  NEGATIVE   H1N1 flu by pcr NOT DETECTED  NOT DETECTED   Comment:            The Xpert Flu assay (FDA approved for     nasal aspirates or washes and     nasopharyngeal swab specimens), is     intended as   an aid in the diagnosis of     influenza and should not be used as     a sole basis for treatment.     Performed at [facility]  CLOSTRIDIUM DIFFICILE BY PCR     Status: Abnormal   Collection Time    01/05/13  5:25 AM      Result Value Range   C difficile by pcr POSITIVE (*) NEGATIVE   Comment: CRITICAL RESULT CALLED TO, READ BACK BY AND VERIFIED WITH:     SHEPHERD RN 9:35 01/05/13 ([email or web address])     Performed at [facility]  GLUCOSE, CAPILLARY     Status: Abnormal   Collection Time    01/05/13  9:00 AM      Result Value Range   Glucose-Capillary 204 (*) 70 - 99 mg/dL   Recent Results (from the past 240 hour(s))  CLOSTRIDIUM DIFFICILE BY PCR     Status: Abnormal   Collection Time    01/05/13  5:25 AM      Result Value Range Status   C difficile by pcr POSITIVE (*) NEGATIVE Final   Comment: CRITICAL RESULT CALLED TO, READ BACK BY AND VERIFIED WITH:     SHEPHERD RN 9:35 01/05/13 ([email or web address])     Performed at [facility]   Creatinine:  Recent Labs  01/05/13 0440 01/05/13 0451  CREATININE 2.71* 2.90*   Baseline Creatinine: Unknown  Impression/Assessment:  Probable urosepsis. The patient has an obstructing 7 mm stone in his presented with fever, hypotension and mental status changes. He requires stabilization with pressor support, broad-spectrum antibiotics and critical care input. Once stabilized we will plan on bringing him to the OR for placement of a double-J stent for decompression of the left kidney. No attempt at stone manipulation will be performed. If the patient survives this acute insult then definitive stone management will need to be undertaken at a later date.  Plan:  As above  Devinn Hurwitz S 01/05/2013, 9:57 AM       

## 2013-01-05 NOTE — ED Notes (Signed)
iStat CG4 given to dr. Dierdre Highman

## 2013-01-05 NOTE — Preoperative (Signed)
Beta Blockers   Reason not to administer Beta Blockers:Not Applicable, Hold beta blocker due to hypotension 

## 2013-01-05 NOTE — Progress Notes (Signed)
PCCM  Flu screen POSITIVE Influenza A  C diff POSITIVE  See orders  Luisa Hart WrightMD

## 2013-01-05 NOTE — Progress Notes (Signed)
Followup note:  Patient admitted early this morning for septic shock. CT of abdomen and pelvis also noted for obstructing stone causing septic uropathy. Neurology consulted as was total care given septic shock. Patient briefly required pressor support was able to wean off of this and now on IV fluids. C. difficile cultures and influenza cultures positive. Patient on IV antibiotics plus Tamiflu. Urology was able to remove stone. Patient's breathing with to be more labored. BNP checked and found to be elevated at 8700. No previous echo done. We'll order. We'll try to gently diurese as pressures now a bit more stable. Labs this afternoon had worsened since admission.

## 2013-01-05 NOTE — ED Provider Notes (Signed)
CSN: [id number]     Arrival date & time 01/05/13  0249 History   First MD Initiated Contact with Patient 01/05/13 [phone]     Chief Complaint  Patient presents with  . Fever   (Consider location/radiation/quality/duration/timing/severity/associated sxs/prior Treatment) HPI History provided by EMS and patient. Resides in a nursing home, presents with DO NOT RESUSCITATE paperwork and fever with nausea and diarrhea onset tonight. Has had multiple episodes of diarrhea prior to arrival and in the ED. No blood in stools. Has some abdominal cramping. No vomiting. No rash. No cough or difficulty breathing. No chest pain. Temperature 104.5, patient is a limited historian. He denies any other complaints. Past Medical History  Diagnosis Date  . Hypertension   . Diabetes mellitus without complication   . Constipation   . DVT (deep venous thrombosis)   . Back pain   . Kidney injury    History reviewed. No pertinent past surgical history. No family history on file. History  Substance Use Topics  . Smoking status: Former Games developer  . Smokeless tobacco: Not on file  . Alcohol Use: No    Review of Systems  Constitutional: Positive for fever and chills.  Eyes: Negative for visual disturbance.  Respiratory: Negative for shortness of breath.   Cardiovascular: Negative for chest pain.  Gastrointestinal: Positive for nausea, abdominal pain and diarrhea. Negative for blood in stool.  Genitourinary: Negative for dysuria.  Musculoskeletal: Negative for back pain and neck stiffness.  Skin: Negative for rash.  Neurological: Negative for headaches.  All other systems reviewed and are negative.    Allergies  Carbapenems; Cephalosporins; Erythromycin; Penicillins; and Sulfa antibiotics  Home Medications  No current outpatient prescriptions on file. Temp(Src) 104.5 F (40.3 C) (Rectal) Physical Exam  Constitutional: He appears well-developed and well-nourished.  HENT:  Head: Normocephalic and  atraumatic.  Eyes: EOM are normal. Pupils are equal, round, and reactive to light.  Neck: Neck supple.  Cardiovascular: Regular rhythm and intact distal pulses.   Tachycardic  Pulmonary/Chest: Effort normal and breath sounds normal. No respiratory distress. He exhibits no tenderness.  Abdominal: Soft. Bowel sounds are normal.  Mild diffuse tenderness without rebound or guarding  Musculoskeletal: Normal range of motion. He exhibits no edema.  Neurological:  Awake, alert, no unilateral deficits  Skin: Skin is warm and dry.    ED Course  Procedures (including critical care time) Labs Review Labs Reviewed  CLOSTRIDIUM DIFFICILE BY PCR - Abnormal; Notable for the following:    C difficile by pcr POSITIVE (*)    All other components within normal limits  CBC - Abnormal; Notable for the following:    RBC 4.04 (*)    Hemoglobin 11.8 (*)    HCT 35.9 (*)    Platelets 127 (*)    All other components within normal limits  COMPREHENSIVE METABOLIC PANEL - Abnormal; Notable for the following:    Sodium 133 (*)    Chloride 95 (*)    Glucose, Bld 156 (*)    BUN 49 (*)    Creatinine, Ser 2.71 (*)    Albumin 2.8 (*)    Alkaline Phosphatase 166 (*)    GFR calc non Af Amer 22 (*)    GFR calc Af Amer 25 (*)    All other components within normal limits  URINALYSIS, ROUTINE W REFLEX MICROSCOPIC - Abnormal; Notable for the following:    APPearance TURBID (*)    Hgb urine dipstick LARGE (*)    Protein, ur 100 (*)  Leukocytes, UA LARGE (*)    All other components within normal limits  INFLUENZA PANEL BY PCR - Abnormal; Notable for the following:    Influenza A By PCR POSITIVE (*)    All other components within normal limits  GLUCOSE, CAPILLARY - Abnormal; Notable for the following:    Glucose-Capillary 204 (*)    All other components within normal limits  URINE MICROSCOPIC-ADD ON - Abnormal; Notable for the following:    Bacteria, UA MANY (*)    All other components within normal limits   CARBOXYHEMOGLOBIN - Abnormal; Notable for the following:    Total hemoglobin 9.8 (*)    Methemoglobin 1.9 (*)    All other components within normal limits  PRO B NATRIURETIC PEPTIDE - Abnormal; Notable for the following:    Pro B Natriuretic peptide (BNP) 8741.0 (*)    All other components within normal limits  BASIC METABOLIC PANEL - Abnormal; Notable for the following:    Sodium 135 (*)    CO2 18 (*)    Glucose, Bld 206 (*)    BUN 54 (*)    Creatinine, Ser 2.87 (*)    Calcium 8.0 (*)    GFR calc non Af Amer 20 (*)    GFR calc Af Amer 24 (*)    All other components within normal limits  CBC - Abnormal; Notable for the following:    WBC 21.0 (*)    RBC 3.38 (*)    Hemoglobin 9.7 (*)    HCT 30.1 (*)    RDW 15.8 (*)    Platelets 100 (*)    All other components within normal limits  GLUCOSE, CAPILLARY - Abnormal; Notable for the following:    Glucose-Capillary 188 (*)    All other components within normal limits  GLUCOSE, CAPILLARY - Abnormal; Notable for the following:    Glucose-Capillary 185 (*)    All other components within normal limits  CG4 I-STAT (LACTIC ACID) - Abnormal; Notable for the following:    Lactic Acid, Venous 4.83 (*)    All other components within normal limits  POCT I-STAT, CHEM 8 - Abnormal; Notable for the following:    BUN 47 (*)    Creatinine, Ser 2.90 (*)    Glucose, Bld 156 (*)    Hemoglobin 12.9 (*)    HCT 38.0 (*)    All other components within normal limits  MRSA PCR SCREENING  CULTURE, BLOOD (ROUTINE X 2)  CULTURE, BLOOD (ROUTINE X 2)  CULTURE, EXPECTORATED SPUTUM-ASSESSMENT  GRAM STAIN  URINE CULTURE  CULTURE, EXPECTORATED SPUTUM-ASSESSMENT  STREP PNEUMONIAE URINARY ANTIGEN  LEGIONELLA ANTIGEN, URINE  BASIC METABOLIC PANEL  CBC WITH DIFFERENTIAL   Imaging Review Dg Chest Portable 1 View  01/05/2013   CLINICAL DATA:  Fever of 104.  Diarrhea and confusion.  EXAM: PORTABLE CHEST - 1 VIEW  COMPARISON:  None.  FINDINGS: Shallow  inspiration. Heart size and pulmonary vascularity are probably normal for technique. There is suggestion of ulceration 0 left hemidiaphragm suggesting a small pleural effusion and basilar infiltration. Pneumonia could have this appearance. No apparent pneumothorax. Postoperative change in the right shoulder.  IMPRESSION: Technically limited study but suggestion of infiltration and effusion on the left. Changes likely represent left lower lobe pneumonia appear   Electronically Signed   By: Burman Nieves M.D.   On: 01/05/2013 04:03   Dg Abd Portable 1v  01/05/2013   CLINICAL DATA:  Fever, diarrhea, confusion.  EXAM: PORTABLE ABDOMEN - 1 VIEW  COMPARISON:  Technically limited study due to patient positioning.  FINDINGS: Paucity of gas in the abdomen. No small or large bowel distention is suggested. There are healing fractures of the right 11th and 12th ribs. Scoliosis and degenerative changes of the lumbar spine with postoperative changes in the lower lumbar spine. Pelvis appears intact.  IMPRESSION: Technically limited study. Relatively gasless abdomen without evidence of obstruction.   Electronically Signed   By: Burman Nieves M.D.   On: 01/05/2013 04:07    IV fluids. IV Zofran. Tamiflu. Levaquin for infiltrate/effusion on chest x-ray, CT abdomen and pelvis ordered  6:08 AM discussed with triad hospitalist, will evaluate for admission  MDM  Dx: Fever, Diarrhea, Renal Insuff  Elevated creatinine, no old labs to compare. Elevated WBC, Influenza pending Imaging reviewed Medications provided MED admit   Sunnie Nielsen, MD 01/05/13 2304

## 2013-01-05 NOTE — Procedures (Addendum)
    Central Venous Catheter Insertion Procedure Note Jeshua Ransford [id number] 29-Dec-1940  Procedure: Insertion of Central Venous Catheter Indications: Assessment of intravascular volume, Drug and/or fluid administration and Frequent blood sampling  Procedure Details Consent: Risks of procedure as well as the alternatives and risks of each were explained to the (patient/caregiver).  Consent for procedure obtained. Time Out: Verified patient identification, verified procedure, site/side was marked, verified correct patient position, special equipment/implants available, medications/allergies/relevent history reviewed, required imaging and test results available.  Performed Real time [place] used to ID and cannulate the vessel  Maximum sterile technique was used including antiseptics, cap, gloves, gown, hand hygiene, mask and sheet. Skin prep: Chlorhexidine; local anesthetic administered A antimicrobial bonded/coated triple lumen catheter was placed in the right internal jugular vein using the Seldinger technique.  Evaluation Blood flow good Complications: No apparent complications Patient did tolerate procedure well. Chest X-ray ordered to verify placement.  CXR: pending.  BABCOCK,PETE 01/05/2013, 10:39 AM  Dorcas Carrow Beeper  [phone]  Cell  [phone]  If no response or cell goes to voicemail, call beeper [phone]

## 2013-01-05 NOTE — ED Notes (Signed)
Bed: RU04 Expected date:  Expected time:  Means of arrival:  Comments: EMS/elderly/flu type symptoms

## 2013-01-05 NOTE — Progress Notes (Signed)
UR completed 

## 2013-01-05 NOTE — H&P (Signed)
[facility]                                                                                    Patient Demographics  Henry Wyatt, is a 72 y.o. male  CSN: [id number]  MRN: [id number]  DOB - Jan 25, 1940  Admit Date - 01/05/2013  Outpatient Primary MD for the patient is Ginette Otto, MD   With History of -  Past Medical History  Diagnosis Date  . Hypertension   . Diabetes mellitus without complication   . Constipation   . DVT (deep venous thrombosis)   . Back pain   . Kidney injury       History reviewed. No pertinent past surgical history.  in for   Chief Complaint  Patient presents with  . Fever     HPI  Henry Wyatt  is a 72 y.o. male, nursing home resident, with history of hypertension and diabetes, sent from the nursing home for evaluation for fever and change in mental status. Patient is confused but he was noted to have diarrhea    Review of Systems    Could not be obtained due to patient's condition   Social History History  Substance Use Topics  . Smoking status: Former Games developer  . Smokeless tobacco: Not on file  . Alcohol Use: No     Family History Unobtainable due to patient's condition  Prior to Admission medications   Medication Sig Start Date End Date Taking? Authorizing Provider  acetaminophen (TYLENOL) 500 MG tablet Take 1,000 mg by mouth every 8 (eight) hours as needed for mild pain.   Yes Historical Provider, MD  albuterol (PROVENTIL) (2.5 MG/3ML) 0.083% nebulizer solution Take 2.5 mg by nebulization every 2 (two) hours as needed for wheezing or shortness of breath.   Yes Historical Provider, MD  apixaban (ELIQUIS) 5 MG TABS tablet Take 5 mg by mouth 2 (two) times daily.   Yes Historical Provider, MD  atorvastatin (LIPITOR) 20 MG tablet Take 20 mg by mouth daily.   Yes Historical Provider, MD  citalopram (CELEXA) 10 MG tablet Take 10 mg by mouth daily.   Yes Historical Provider, MD  cyanocobalamin 1000 MCG  tablet Take 100 mcg by mouth daily.   Yes Historical Provider, MD  divalproex (DEPAKOTE) 125 MG DR tablet Take 125 mg by mouth 3 (three) times daily.   Yes Historical Provider, MD  docusate sodium (COLACE) 100 MG capsule Take 100 mg by mouth 2 (two) times daily.   Yes Historical Provider, MD  folic acid (FOLVITE) 1 MG tablet Take 1 mg by mouth daily.   Yes Historical Provider, MD  insulin aspart (NOVOLOG) 100 UNIT/ML injection Inject 1-12 Units into the skin 3 (three) times daily before meals. Per sliding scale   Yes Historical Provider, MD  metoprolol succinate (TOPROL-XL) 25 MG 24 hr tablet Take 37.5 mg by mouth at bedtime.   Yes Historical Provider, MD  ondansetron (ZOFRAN) 4 MG tablet Take 4 mg by mouth every 8 (eight) hours as needed for nausea or vomiting.   Yes Historical Provider, MD  oxyCODONE (OXY IR/ROXICODONE) 5 MG immediate release tablet Take 5 mg by mouth every 6 (  six) hours as needed for severe pain.   Yes Historical Provider, MD  pantoprazole (PROTONIX) 40 MG tablet Take 40 mg by mouth daily.   Yes Historical Provider, MD  polyethylene glycol (MIRALAX / GLYCOLAX) packet Take 17 g by mouth daily.   Yes Historical Provider, MD  valsartan (DIOVAN) 320 MG tablet Take 320 mg by mouth daily.   Yes Historical Provider, MD    Allergies  Allergen Reactions  . Carbapenems Other (See Comments)    Per nursing home mar  . Cephalosporins Other (See Comments)    Per nursing home mar  . Erythromycin Other (See Comments)    Per nursing home mar  . Penicillins Other (See Comments)    Per nursing home mar  . Sulfa Antibiotics Other (See Comments)    Per nursing home mar    Physical Exam  Vitals  Blood pressure 115/85, pulse 106, temperature 103.4 F (39.7 C), temperature source Rectal, resp. rate 32, SpO2 96.00%.   1. General confused, having chills  2. psychiatrically, could not be assessed  3. Neurologically could not be assessed  4. Ears and Eyes appear Normal, Conjunctivae  clear, PERRLA. Dry Oral Mucosa.  5. Supple Neck, No JVD, No cervical lymphadenopathy appriciated, No Carotid Bruits.  6. Symmetrical Chest wall movement, decreased breath sounds at the left base.  7. RRR, No Gallops, Rubs or Murmurs, No Parasternal Heave.  8. Positive Bowel Sounds, Abdomen Soft, Non tender, No organomegaly appriciated,No rebound -guarding or rigidity.  9.  No Cyanosis, Normal Skin Turgor, No Skin Rash or Bruise.  10. Good muscle tone,  joints appear normal , no effusions, Normal ROM.  11. No Palpable Lymph Nodes in Neck or Axillae    Data Review  CBC  Recent Labs Lab 01/05/13 0440 01/05/13 0451  WBC 6.0  --   HGB 11.8* 12.9*  HCT 35.9* 38.0*  PLT 127*  --   MCV 88.9  --   MCH 29.2  --   MCHC 32.9  --   RDW 15.4  --    ------------------------------------------------------------------------------------------------------------------  Chemistries   Recent Labs Lab 01/05/13 0440 01/05/13 0451  NA 133* 137  K 3.9 4.0  CL 95* 100  CO2 21  --   GLUCOSE 156* 156*  BUN 49* 47*  CREATININE 2.71* 2.90*  CALCIUM 8.7  --   AST 16  --   ALT 11  --   ALKPHOS 166*  --   BILITOT 0.5  --    ------------------------------------------------------------------------------------------------------------------  ---------------------------------------------------------------------------------------------------------------   ----------------------------------------------------------------------------------------------------------------      Imaging results:   Dg Chest Portable 1 View  01/05/2013   CLINICAL DATA:  Fever of 104.  Diarrhea and confusion.  EXAM: PORTABLE CHEST - 1 VIEW  COMPARISON:  None.  FINDINGS: Shallow inspiration. Heart size and pulmonary vascularity are probably normal for technique. There is suggestion of ulceration 0 left hemidiaphragm suggesting a small pleural effusion and basilar infiltration. Pneumonia could have this appearance.  No apparent pneumothorax. Postoperative change in the right shoulder.  IMPRESSION: Technically limited study but suggestion of infiltration and effusion on the left. Changes likely represent left lower lobe pneumonia appear   Electronically Signed   By: Burman Nieves M.D.   On: 01/05/2013 04:03   Dg Abd Portable 1v  01/05/2013   CLINICAL DATA:  Fever, diarrhea, confusion.  EXAM: PORTABLE ABDOMEN - 1 VIEW  COMPARISON:  Technically limited study due to patient positioning.  FINDINGS: Paucity of gas in the abdomen. No small or large  bowel distention is suggested. There are healing fractures of the right 11th and 12th ribs. Scoliosis and degenerative changes of the lumbar spine with postoperative changes in the lower lumbar spine. Pelvis appears intact.  IMPRESSION: Technically limited study. Relatively gasless abdomen without evidence of obstruction.   Electronically Signed   By: Burman Nieves M.D.   On: 01/05/2013 04:07      Assessment & Plan  1. febrile illness 2. Left lower lobe pneumonia 3. SIRS with lactic acidosis 4. Renal failure, unknown acuity 5. History of diabetes 6. History of hypertension  Plan  Admit to step down IV fluids IV antibiotics (vancomycin and Azactam) Check cultures Check urinalysis Check  Flu DO NOT RESUSCITATE  DVT Prophylaxis Eliquis  AM Labs Ordered, also please review Full Orders    Code Status DO NOT RESUSCITATE  Disposition Plan: Nursing home  Time spent in minutes : 40 minutes  Condition GUARDED

## 2013-01-05 NOTE — ED Notes (Signed)
Pt presents via EMS with c/o fever. Pt resides at [facility]. Per EMS, facility doctor wanted patient to be seen because they believed he was septic. Per EMS, pt's main complaint is abdominal pain.

## 2013-01-06 ENCOUNTER — Inpatient Hospital Stay (HOSPITAL_COMMUNITY): Payer: Medicare Other

## 2013-01-06 DIAGNOSIS — I5042 Chronic combined systolic (congestive) and diastolic (congestive) heart failure: Secondary | ICD-10-CM | POA: Diagnosis present

## 2013-01-06 DIAGNOSIS — J111 Influenza due to unidentified influenza virus with other respiratory manifestations: Secondary | ICD-10-CM

## 2013-01-06 DIAGNOSIS — N10 Acute tubulo-interstitial nephritis: Secondary | ICD-10-CM | POA: Diagnosis present

## 2013-01-06 LAB — BASIC METABOLIC PANEL
BUN: 57 mg/dL — ABNORMAL HIGH (ref 6–23)
CO2: 20 mEq/L (ref 19–32)
Calcium: 8.4 mg/dL (ref 8.4–10.5)
Chloride: 100 mEq/L (ref 96–112)
Creatinine, Ser: 2.81 mg/dL — ABNORMAL HIGH (ref 0.50–1.35)
GFR calc Af Amer: 24 mL/min — ABNORMAL LOW (ref >90)
GFR calc non Af Amer: 21 mL/min — ABNORMAL LOW (ref >90)
Glucose, Bld: 174 mg/dL — ABNORMAL HIGH (ref 70–99)
Potassium: 5 mEq/L (ref 3.7–5.3)
Sodium: 134 mEq/L — ABNORMAL LOW (ref 137–147)

## 2013-01-06 LAB — CBC
HCT: 30 % — ABNORMAL LOW (ref 39.0–52.0)
HEMOGLOBIN: 10.1 g/dL — AB (ref 13.0–17.0)
MCH: 29.4 pg (ref 26.0–34.0)
MCHC: 33.7 g/dL (ref 30.0–36.0)
MCV: 87.2 fL (ref 78.0–100.0)
Platelets: 114 10*3/uL — ABNORMAL LOW (ref 150–400)
RBC: 3.44 MIL/uL — AB (ref 4.22–5.81)
RDW: 15.8 % — ABNORMAL HIGH (ref 11.5–15.5)
WBC: 20.9 10*3/uL — AB (ref 4.0–10.5)

## 2013-01-06 LAB — GLUCOSE, CAPILLARY
GLUCOSE-CAPILLARY: 139 mg/dL — AB (ref 70–99)
GLUCOSE-CAPILLARY: 86 mg/dL (ref 70–99)
GLUCOSE-CAPILLARY: 93 mg/dL (ref 70–99)
Glucose-Capillary: 102 mg/dL — ABNORMAL HIGH (ref 70–99)

## 2013-01-06 LAB — CBC WITH DIFFERENTIAL/PLATELET
Basophils Absolute: 0 10*3/uL (ref 0.0–0.1)
Basophils Relative: 0 % (ref 0–1)
Eosinophils Absolute: 0 10*3/uL (ref 0.0–0.7)
Eosinophils Relative: 0 % (ref 0–5)
HCT: 29.2 % — ABNORMAL LOW (ref 39.0–52.0)
Hemoglobin: 9.6 g/dL — ABNORMAL LOW (ref 13.0–17.0)
Lymphocytes Relative: 3 % — ABNORMAL LOW (ref 12–46)
Lymphs Abs: 0.7 10*3/uL (ref 0.7–4.0)
MCH: 28.9 pg (ref 26.0–34.0)
MCHC: 32.9 g/dL (ref 30.0–36.0)
MCV: 88 fL (ref 78.0–100.0)
Monocytes Absolute: 0.9 10*3/uL (ref 0.1–1.0)
Monocytes Relative: 4 % (ref 3–12)
Neutro Abs: 20.5 10*3/uL — ABNORMAL HIGH (ref 1.7–7.7)
Neutrophils Relative %: 93 % — ABNORMAL HIGH (ref 43–77)
Platelets: 88 10*3/uL — ABNORMAL LOW (ref 150–400)
RBC: 3.32 MIL/uL — ABNORMAL LOW (ref 4.22–5.81)
RDW: 15.7 % — ABNORMAL HIGH (ref 11.5–15.5)
WBC: 22.1 10*3/uL — ABNORMAL HIGH (ref 4.0–10.5)

## 2013-01-06 MED ORDER — LEVOFLOXACIN IN D5W 750 MG/150ML IV SOLN
750.0000 mg | INTRAVENOUS | Status: DC
  Administered 2013-01-06 – 2013-01-08 (×2): 750 mg via INTRAVENOUS
  Filled 2013-01-06 (×2): qty 150

## 2013-01-06 MED ORDER — LEVOFLOXACIN IN D5W 750 MG/150ML IV SOLN
750.0000 mg | INTRAVENOUS | Status: DC
  Filled 2013-01-06: qty 150

## 2013-01-06 MED ORDER — HALOPERIDOL LACTATE 5 MG/ML IJ SOLN
2.0000 mg | Freq: Once | INTRAMUSCULAR | Status: AC
  Administered 2013-01-06: 2 mg via INTRAVENOUS
  Filled 2013-01-06: qty 1

## 2013-01-06 MED ORDER — ALBUTEROL SULFATE (2.5 MG/3ML) 0.083% IN NEBU
2.5000 mg | INHALATION_SOLUTION | RESPIRATORY_TRACT | Status: DC | PRN
  Administered 2013-01-06 – 2013-01-08 (×5): 2.5 mg via RESPIRATORY_TRACT
  Filled 2013-01-06 (×6): qty 3

## 2013-01-06 MED ORDER — HALOPERIDOL LACTATE 5 MG/ML IJ SOLN
5.0000 mg | Freq: Once | INTRAMUSCULAR | Status: AC
  Administered 2013-01-06: 5 mg via INTRAVENOUS
  Filled 2013-01-06: qty 1

## 2013-01-06 NOTE — Progress Notes (Signed)
1 Day Post-Op Subjective: Patient remains agitated but awake. He is hemodynamically stable off pressors at this time. Currently afebrile. Urine output has improved substantially but creatinine has not yet improved. Patient apparently had positive cultures for influenza as well as C. difficile colitis. He clearly had significant purulent material in his left kidney when stent was placed.  Objective: Vital signs in last 24 hours: Temp:  [99.5 F (37.5 C)-101.3 F (38.5 C)] 99.5 F (37.5 C) (01/01 0800) Pulse Rate:  [81-103] 86 (01/01 0800) Resp:  [13-35] 22 (01/01 0800) BP: (62-140)/(35-103) 109/66 mmHg (01/01 0800) SpO2:  [86 %-97 %] 94 % (01/01 0800) Weight:  [225 lb 1.4 oz (102.1 kg)-229 lb 4.5 oz (104 kg)] 229 lb 4.5 oz (104 kg) (01/01 0530)  Intake/Output from previous day: 12/31 0701 - 01/01 0700 In: 1994.5 [I.V.:574.5; IV Piggyback:1400] Out: 1170 [Urine:1170] Intake/Output this shift: Total I/O In: 20 [Other:20] Out: -   Physical Exam:  Constitutional: Vital signs reviewed. WD WN in NAD  not currently oriented and continues to be agitated. Eyes: PERRL, No scleral icterus.   Cardiovascular: RRR Pulmonary/Chest: Normal effort Abdominal: Soft. Non-tender, non-distended, bowel sounds are normal, no masses, organomegaly, or guarding present.  Genitourinary: Foley draining relatively clear urine. Extremities: No cyanosis or edema   Lab Results:  Recent Labs  01/05/13 0451 01/05/13 1550 01/06/13 0430  HGB 12.9* 9.7* 9.6*  HCT 38.0* 30.1* 29.2*   BMET  Recent Labs  01/05/13 1550 01/06/13 0430  NA 135* 134*  K 4.9 5.0  CL 100 100  CO2 18* 20  GLUCOSE 206* 174*  BUN 54* 57*  CREATININE 2.87* 2.81*  CALCIUM 8.0* 8.4   No results found for this basename: LABPT, INR,  in the last 72 hours No results found for this basename: LABURIN,  in the last 72 hours Results for orders placed during the hospital encounter of 01/05/13  CLOSTRIDIUM DIFFICILE BY PCR      Status: Abnormal   Collection Time    01/05/13  5:25 AM      Result Value Range Status   C difficile by pcr POSITIVE (*) NEGATIVE Final   Comment: CRITICAL RESULT CALLED TO, READ BACK BY AND VERIFIED WITH:     SHEPHERD RN 9:35 01/05/13 ([email or web address])     Performed at [facility]  MRSA PCR SCREENING     Status: None   Collection Time    01/05/13  9:06 AM      Result Value Range Status   MRSA by PCR NEGATIVE  NEGATIVE Final   Comment:            The GeneXpert MRSA Assay (FDA     approved for NASAL specimens     only), is one component of a     comprehensive MRSA colonization     surveillance program. It is not     intended to diagnose MRSA     infection nor to guide or     monitor treatment for     MRSA infections.  CULTURE, BLOOD (ROUTINE X 2)     Status: None   Collection Time    01/05/13  9:30 AM      Result Value Range Status   Specimen Description BLOOD LEFT ARM   Final   Special Requests BOTTLES DRAWN AEROBIC AND ANAEROBIC 10CC   Final   Culture  Setup Time     Final   Value: 01/05/2013 14:04     Performed at [facility]  Culture     Final   Value: GRAM NEGATIVE RODS     Note: Gram Stain Report Called to,Read Back By and Verified With: Lezlie LyeLISA FREI [id number] BRMEL     [facility]   Report Status PENDING   Incomplete    Studies/Results: Ct Abdomen Pelvis Wo Contrast  01/05/2013   CLINICAL DATA:  73 year old male with fever and abdominal pain. Renal insufficiency precludes IV contrast. Initial encounter.  EXAM: CT ABDOMEN AND PELVIS WITHOUT CONTRAST  TECHNIQUE: Multidetector CT imaging of the abdomen and pelvis was performed following the standard protocol without intravenous contrast.  COMPARISON:  Portable abdomen radiographs from the same day.  FINDINGS: Mild motion artifact. Confluent left lower lobe and posterior lingula pulmonary opacity, somewhat beyond that typically seen for simple atelectasis. Cardiomegaly. No pericardial or  pleural effusion. Mild atelectasis at the right lung base.  Chronic appearing right lower posterior rib fractures. Scoliosis and advanced degenerative changes in the spine. Previous lumbosacral junction posterior and interbody fusion. Previous partial laminectomy in the upper lumbar spine. No acute osseous abnormality identified.  Mild presacral stranding (series 2, image 77) contiguous with inflammation in the retroperitoneal space along the left pelvic sidewall tracking to the lower abdomen. Inflammation tracks along the left gutter and there is thickening of the left para renal fascia.  There is in a regular 7 x 7 mm obstructing calculus in the left ureter at the L4-L5 disc level. There is upstream hydroureter and periureteral stranding. There is left hydronephrosis with a subtle fluid fluid level in the left hemipelvis (series 2, image 40). There are additional nonobstructing left intra renal calculi. Left perinephric stranding is moderate.  Beyond the obstruction, the distal left ureter appears normal. The bladder is within normal limits. Right nephrolithiasis without obstruction.  There is mild wall thickening of the distal colon, most pronounced in the sigmoid. There are sigmoid diverticula. Still, this segment is under distended. The left colon is also decompressed with diverticula.  More normal appearance of the transverse colon and right: . Appendix appears to be surgically absent. No dilated small bowel. Negative stomach and duodenum. Negative non contrast liver, gallbladder, spleen, pancreas and right adrenal gland. There is a 3 cm left adrenal low density nodule (12 Hounsfield units). No abdominal free fluid or free air.  IMPRESSION: 1. Acute obstructive uropathy on the left, with a 7 x 7 mm calculus at the L4-L5 disc level. Moderate to severe perinephric and periureteric stranding plus a fluid-fluid level in the left renal pelvis (may reflect hematuria).  2. Stranding along the left pericolic gutter  and retroperitoneal space tracking to the presacral space all felt to be related to #1. There is diverticulosis of the sigmoid and descending colon, but I do not favor superimposed colitis at this time.  3. Confluent opacity at the left lung base, favor atelectasis in light of #1, but developing left lung base infection difficult to exclude.  4.  Additional bilateral nephrolithiasis.  5. Low-density 3 cm left adrenal nodule, favor benign adenoma.   Electronically Signed   By: Augusto GambleLee  Hall M.D.   On: 01/05/2013 08:56   Dg Chest Port 1 View  01/06/2013   CLINICAL DATA:  Evaluate for infiltrates.  EXAM: PORTABLE CHEST - 1 VIEW  COMPARISON:  01/05/2013  FINDINGS: Right IJ central venous catheter has tip over the SVC just below the level of the carinal. Lungs are hypoinflated with slight worsening left basilar opacification and mild bilateral perihilar opacification. There likely  a small amount of left pleural fluid present. There is mild stable cardiomegaly. Remainder of the exam is unchanged.  IMPRESSION: Worsening left basilar opacification which may be due to small effusion with atelectasis although cannot exclude infection in the left base. Slight worsening bilateral perihilar opacification as cannot exclude mild interstitial edema.  Stable cardiomegaly.  Right IJ central venous catheter with tip over the SVC.   Electronically Signed   By: Elberta Fortis M.D.   On: 01/06/2013 07:28   Dg Chest Port 1 View  01/05/2013   CLINICAL DATA:  Line placement  EXAM: PORTABLE CHEST - 1 VIEW  COMPARISON:  01/05/2013 322 hrs  FINDINGS: Cardiac shadow is stable. A right-sided jugular central line is now seen with the tip in the mid right atrium. No pneumothorax is seen. The overall inspiratory effort is poor with evidence of mild vascular congestion. Left basilar atelectasis is again noted.  IMPRESSION: No pneumothorax following central line placement.   Electronically Signed   By: Alcide Clever M.D.   On: 01/05/2013 11:30   Dg  Chest Portable 1 View  01/05/2013   CLINICAL DATA:  Fever of 104.  Diarrhea and confusion.  EXAM: PORTABLE CHEST - 1 VIEW  COMPARISON:  None.  FINDINGS: Shallow inspiration. Heart size and pulmonary vascularity are probably normal for technique. There is suggestion of ulceration 0 left hemidiaphragm suggesting a small pleural effusion and basilar infiltration. Pneumonia could have this appearance. No apparent pneumothorax. Postoperative change in the right shoulder.  IMPRESSION: Technically limited study but suggestion of infiltration and effusion on the left. Changes likely represent left lower lobe pneumonia appear   Electronically Signed   By: Burman Nieves M.D.   On: 01/05/2013 04:03   Dg Abd Portable 1v  01/05/2013   CLINICAL DATA:  Fever, diarrhea, confusion.  EXAM: PORTABLE ABDOMEN - 1 VIEW  COMPARISON:  Technically limited study due to patient positioning.  FINDINGS: Paucity of gas in the abdomen. No small or large bowel distention is suggested. There are healing fractures of the right 11th and 12th ribs. Scoliosis and degenerative changes of the lumbar spine with postoperative changes in the lower lumbar spine. Pelvis appears intact.  IMPRESSION: Technically limited study. Relatively gasless abdomen without evidence of obstruction.   Electronically Signed   By: Burman Nieves M.D.   On: 01/05/2013 04:07    Assessment/Plan:   Urosepsis with 7 mm ureteral stone. The patient's stone is still in situ but double-J stent is decompressed his system. He will need definitive management at a later date if he makes it through this acute illness. Apparently he also has positive cultures for C. difficile colitis as well as influenza. We'll continue to monitor closely. No other surgical/urologic intervention indicated at this time.   LOS: 1 day   Jamez Ambrocio S 01/06/2013, 8:49 AM

## 2013-01-06 NOTE — Progress Notes (Addendum)
ELink called to update on pts status. After the 3mg  of haldol that Dr. Estrella MyrtleZublevinksy ordered, pt is still restless and confused. MD gave order for 5mg  of haldol. Also updated on Blood cultures that came back positive for gram negative rods. Both Anaerobic and Aerobic. MD said current antibiotic treatment will be continued. Pt stable. Will continue to monitor.

## 2013-01-06 NOTE — Progress Notes (Signed)
eLink Physician-Brief Progress Note Patient Name: Henry SproutWilliam Wyatt DOB: April 21, 1940 MRN: [id number]  Date of Service  01/06/2013   HPI/Events of Note   RN reports blood cx GNR  eICU Interventions   Already on Aztreonam   Intervention Category Intermediate Interventions: Communication with other healthcare providers and/or family  Lonia FarberZUBELEVITSKIY, Binnie Droessler 01/06/2013, 3:08 AM

## 2013-01-06 NOTE — Progress Notes (Signed)
Elink called to update on pts status. Dr. Parke SimmersZublevinsky was in the box and explained pts condition of confusion and restlessness as well as expiratory wheezing that has been occuring throughout shift. MD made aware and gave order for 3mg  of Haldol. Pt is stable. Will continue to monitor.

## 2013-01-06 NOTE — Progress Notes (Signed)
Name: Henry Wyatt MRN: [id number] DOB: 09/13/1940    ADMISSION DATE:  01/05/2013 CONSULTATION DATE:  01/06/2013   REFERRING MD :  edp PRIMARY SERVICE: trh  CHIEF COMPLAINT:  Shock abd pain  BRIEF PATIENT DESCRIPTION:  73 y.o.M adm from snf with abd pain, septic shock > fluA positive, C diff positive, L obstructive pyelonephritis  SIGNIFICANT EVENTS / STUDIES:  ABD pelvic CT 12/31:  L ureter stone with hydronephrosis Left J-J stent placed 12/31 (Dr Isabel Caprice)  LINES / TUBES: PIV  CULTURES: BC x 2 12/31 >> GNR's >>  UC 12/31 resp cult 12/31 C diff titer 12/31    ANTIBIOTICS: Aztreonam 12/31 >>  Vanco IV 12/31 >> 12/31 Vanco PO 12/31 >> Flagyl IV 12/31 >>  levaquin 12/31 > 12/31  tamiflu 12/31 >>    SUBJECTIVE/Interval events:  Remains confused and intermittently agitated > received haldol last night J-J stent placed 12/31, was on norepi for several hours post-op but now weaned to off.   VITAL SIGNS: Temp:  [99.5 F (37.5 C)-100.9 F (38.3 C)] 100 F (37.8 C) (01/01 1000) Pulse Rate:  [81-103] 88 (01/01 1000) Resp:  [16-35] 21 (01/01 1000) BP: (70-140)/(44-103) 117/64 mmHg (01/01 1000) SpO2:  [86 %-97 %] 94 % (01/01 1000) Weight:  [104 kg (229 lb 4.5 oz)] 104 kg (229 lb 4.5 oz) (01/01 0530) HEMODYNAMICS:   VENTILATOR SETTINGS: [place] [facility]   INTAKE / OUTPUT: Intake/Output     12/31 0701 - 01/01 0700 01/01 0701 - 01/02 0700   I.V. (mL/kg) 574.5 (5.5)    Other 20 60   IV Piggyback 1400    Total Intake(mL/kg) 1994.5 (19.2) 60 (0.6)   Urine (mL/kg/hr) 1170 (0.5)    Total Output 1170     Net +824.5 +60          PHYSICAL EXAMINATION: General:  Ill appearing WM disheveled  Neuro:  Altered ,confused HEENT:  Dry mucus membranes Cardiovascular:  Tachy nl s1 s2 no s3 s4 Lungs:  Rales at bases Abdomen:  Soft NT  Musculoskeletal:  From  Skin:  clear  LABS:  CBC  Recent Labs Lab 01/05/13 0440 01/05/13 0451 01/05/13 1550 01/06/13 0430  WBC 6.0   --  21.0* 22.1*  HGB 11.8* 12.9* 9.7* 9.6*  HCT 35.9* 38.0* 30.1* 29.2*  PLT 127*  --  100* 88*   Coag's No results found for this basename: APTT, INR,  in the last 168 hours BMET  Recent Labs Lab 01/05/13 0440 01/05/13 0451 01/05/13 1550 01/06/13 0430  NA 133* 137 135* 134*  K 3.9 4.0 4.9 5.0  CL 95* 100 100 100  CO2 21  --  18* 20  BUN 49* 47* 54* 57*  CREATININE 2.71* 2.90* 2.87* 2.81*  GLUCOSE 156* 156* 206* 174*   Electrolytes  Recent Labs Lab 01/05/13 0440 01/05/13 1550 01/06/13 0430  CALCIUM 8.7 8.0* 8.4   Sepsis Markers  Recent Labs Lab 01/05/13 0449  LATICACIDVEN 4.83*   ABG No results found for this basename: PHART, PCO2ART, PO2ART,  in the last 168 hours Liver Enzymes  Recent Labs Lab 01/05/13 0440  AST 16  ALT 11  ALKPHOS 166*  BILITOT 0.5  ALBUMIN 2.8*   Cardiac Enzymes  Recent Labs Lab 01/05/13 1600  PROBNP 8741.0*   Glucose  Recent Labs Lab 01/05/13 0900 01/05/13 1348 01/05/13 1624 01/05/13 2228 01/06/13 0739  GLUCAP 204* 188* 185* 185* 139*    Imaging Ct Abdomen Pelvis Wo Contrast  01/05/2013   CLINICAL  DATA:  73 year old male with fever and abdominal pain. Renal insufficiency precludes IV contrast. Initial encounter.  EXAM: CT ABDOMEN AND PELVIS WITHOUT CONTRAST  TECHNIQUE: Multidetector CT imaging of the abdomen and pelvis was performed following the standard protocol without intravenous contrast.  COMPARISON:  Portable abdomen radiographs from the same day.  FINDINGS: Mild motion artifact. Confluent left lower lobe and posterior lingula pulmonary opacity, somewhat beyond that typically seen for simple atelectasis. Cardiomegaly. No pericardial or pleural effusion. Mild atelectasis at the right lung base.  Chronic appearing right lower posterior rib fractures. Scoliosis and advanced degenerative changes in the spine. Previous lumbosacral junction posterior and interbody fusion. Previous partial laminectomy in the upper lumbar  spine. No acute osseous abnormality identified.  Mild presacral stranding (series 2, image 77) contiguous with inflammation in the retroperitoneal space along the left pelvic sidewall tracking to the lower abdomen. Inflammation tracks along the left gutter and there is thickening of the left para renal fascia.  There is in a regular 7 x 7 mm obstructing calculus in the left ureter at the L4-L5 disc level. There is upstream hydroureter and periureteral stranding. There is left hydronephrosis with a subtle fluid fluid level in the left hemipelvis (series 2, image 40). There are additional nonobstructing left intra renal calculi. Left perinephric stranding is moderate.  Beyond the obstruction, the distal left ureter appears normal. The bladder is within normal limits. Right nephrolithiasis without obstruction.  There is mild wall thickening of the distal colon, most pronounced in the sigmoid. There are sigmoid diverticula. Still, this segment is under distended. The left colon is also decompressed with diverticula.  More normal appearance of the transverse colon and right: . Appendix appears to be surgically absent. No dilated small bowel. Negative stomach and duodenum. Negative non contrast liver, gallbladder, spleen, pancreas and right adrenal gland. There is a 3 cm left adrenal low density nodule (12 Hounsfield units). No abdominal free fluid or free air.  IMPRESSION: 1. Acute obstructive uropathy on the left, with a 7 x 7 mm calculus at the L4-L5 disc level. Moderate to severe perinephric and periureteric stranding plus a fluid-fluid level in the left renal pelvis (may reflect hematuria).  2. Stranding along the left pericolic gutter and retroperitoneal space tracking to the presacral space all felt to be related to #1. There is diverticulosis of the sigmoid and descending colon, but I do not favor superimposed colitis at this time.  3. Confluent opacity at the left lung base, favor atelectasis in light of #1, but  developing left lung base infection difficult to exclude.  4.  Additional bilateral nephrolithiasis.  5. Low-density 3 cm left adrenal nodule, favor benign adenoma.   Electronically Signed   By: Augusto Gamble M.D.   On: 01/05/2013 08:56   Dg Chest Port 1 View  01/06/2013   CLINICAL DATA:  Evaluate for infiltrates.  EXAM: PORTABLE CHEST - 1 VIEW  COMPARISON:  01/05/2013  FINDINGS: Right IJ central venous catheter has tip over the SVC just below the level of the carinal. Lungs are hypoinflated with slight worsening left basilar opacification and mild bilateral perihilar opacification. There likely a small amount of left pleural fluid present. There is mild stable cardiomegaly. Remainder of the exam is unchanged.  IMPRESSION: Worsening left basilar opacification which may be due to small effusion with atelectasis although cannot exclude infection in the left base. Slight worsening bilateral perihilar opacification as cannot exclude mild interstitial edema.  Stable cardiomegaly.  Right IJ central venous catheter with  tip over the SVC.   Electronically Signed   By: Elberta Fortisaniel  Boyle M.D.   On: 01/06/2013 07:28   Dg Chest Port 1 View  01/05/2013   CLINICAL DATA:  Line placement  EXAM: PORTABLE CHEST - 1 VIEW  COMPARISON:  01/05/2013 322 hrs  FINDINGS: Cardiac shadow is stable. A right-sided jugular central line is now seen with the tip in the mid right atrium. No pneumothorax is seen. The overall inspiratory effort is poor with evidence of mild vascular congestion. Left basilar atelectasis is again noted.  IMPRESSION: No pneumothorax following central line placement.   Electronically Signed   By: Alcide CleverMark  Lukens M.D.   On: 01/05/2013 11:30   Dg Chest Portable 1 View  01/05/2013   CLINICAL DATA:  Fever of 104.  Diarrhea and confusion.  EXAM: PORTABLE CHEST - 1 VIEW  COMPARISON:  None.  FINDINGS: Shallow inspiration. Heart size and pulmonary vascularity are probably normal for technique. There is suggestion of ulceration 0  left hemidiaphragm suggesting a small pleural effusion and basilar infiltration. Pneumonia could have this appearance. No apparent pneumothorax. Postoperative change in the right shoulder.  IMPRESSION: Technically limited study but suggestion of infiltration and effusion on the left. Changes likely represent left lower lobe pneumonia appear   Electronically Signed   By: Burman NievesWilliam  Stevens M.D.   On: 01/05/2013 04:03   Dg Abd Portable 1v  01/05/2013   CLINICAL DATA:  Fever, diarrhea, confusion.  EXAM: PORTABLE ABDOMEN - 1 VIEW  COMPARISON:  Technically limited study due to patient positioning.  FINDINGS: Paucity of gas in the abdomen. No small or large bowel distention is suggested. There are healing fractures of the right 11th and 12th ribs. Scoliosis and degenerative changes of the lumbar spine with postoperative changes in the lower lumbar spine. Pelvis appears intact.  IMPRESSION: Technically limited study. Relatively gasless abdomen without evidence of obstruction.   Electronically Signed   By: Burman NievesWilliam  Stevens M.D.   On: 01/05/2013 04:07     CXR: bibasilar ATX/infiltrates  ASSESSMENT / PLAN:  PULMONARY A:Bibasilar infiltrate c/w HCAP (snf pt ) Influenza  P:   Oxygen titration See ID section  CARDIOVASCULAR A: septic shock mult sources:  Diarrhea, stone in L ureter with hydronephrosis/pyelo, PNA P:  Hemodynamically improved with volume and abx, agree with some gentle diuresis as BP and renal status can tolerate Pressors weaned to off  RENAL A:  AKI on CKD d/t sepsis and renal stone and hydroneph P:   Follow UOP and S Cr post-stent placement on L  GASTROINTESTINAL A:  Diarrhea, C diff positive P:   IVF NPO  HEMATOLOGIC A:  leukocytosis P:  Follow CBC with appropriate abx treatment+  INFECTIOUS A:  Septic shock d/t stone dz hydronephrosis, C diff colitis, possible HCAP GNR bacteremia P:   Agree with abx as orderd see dashboard Follow blood cx speciation  ENDOCRINE A:   DM2 with renal dz   P:   SSI   NEUROLOGIC A:  AMS d/t sepsis P:   Monitor  Note pt is DNI DNR  TODAY'S SUMMARY:   I have personally obtained a history, examined the patient, evaluated laboratory and imaging results, formulated the assessment and plan and placed orders.   Levy Pupaobert Zayleigh Stroh, MD, PhD 01/06/2013, 10:38 AM [facility] [phone] or if no answer [phone]

## 2013-01-06 NOTE — Progress Notes (Signed)
[facility] PROGRESS NOTE  Henry Wyatt ZOX:[id number] DOB: 1940/12/10 DOA: 01/05/2013 PCP: Ginette Otto, MD  Assessment/Plan: Principal Problem:   Septic shock(785.52): Blood cultures preliminary growing out gram-negative rods, presumed urine Active Problems:   Diarrhea: Secondary C. difficile   HCAP (healthcare-associated pneumonia): Questionable. Respiratory failure may be more secondary to volume overload and influenza    Renal stone: Status post renal extraction., On IV antibiotics. Awaiting sensitivities.    Hydronephrosis of left kidney: Secondary to renal stone obstruction, resolved    Altered mental status in the setting of suspected mild underlying dementia. This is metabolic encephalopathy from sepsis from multiple sources of infection    DM (diabetes mellitus), type 2 with renal complications    Acute renal failure in the setting of CKD (chronic kidney disease): Unclear baseline, however improved slightly today  Acute congestive heart failure: BNP elevated and responding to gentle diuresis. Awaiting echocardiogram, has diuresed approximately 1.5 L and is now net neutral from admission    Influenza A: On Tamiflu  Code Status: DO NOT RESUSCITATE Family Communication: Left message with family  Disposition Plan: Showing some improvement in blood pressure, not yet stable   Consultants:  Critical care  Urology  Procedures:  Echocardiogram ordered J-J stent placed 12/31, was on norepi for several hours post-op but now weaned to off.    Antibiotics:  IV Azactam 12/31-present  IV Flagyl 12/31-present  IV Levaquin 12/31-present  By mouth vancomycin 12/31-present  Tamiflu 12/31-present  IV vancomycin 12/31-discontinued  HPI/Subjective: Patient doing okay. Confused. Fatigued. Denies any shortness of breath  Objective: Filed Vitals:   01/06/13 1400  BP: 89/44  Pulse: 79  Temp: 99.7 F (37.6 C)  Resp: 17    Intake/Output Summary  (Last 24 hours) at 01/06/13 1542 Last data filed at 01/06/13 1229  Gross per 24 hour  Intake    770 ml  Output   2270 ml  Net  -1500 ml   Filed Weights   01/05/13 0857 01/06/13 0530  Weight: 102.1 kg (225 lb 1.4 oz) 104 kg (229 lb 4.5 oz)    Exam:   General:  Somnolent, oriented x1  Cardiovascular: Regular rhythm, occasional PVCs  Respiratory: Decreased breath sounds at the bases  Abdomen: Soft, nontender?, Minimal distention, hypoactive bowel sounds  Musculoskeletal: No clubbing or cyanosis, trace edema bilaterally   Data Reviewed: Basic Metabolic Panel:  Recent Labs Lab 01/05/13 0440 01/05/13 0451 01/05/13 1550 01/06/13 0430  NA 133* 137 135* 134*  Wyatt 3.9 4.0 4.9 5.0  CL 95* 100 100 100  CO2 21  --  18* 20  GLUCOSE 156* 156* 206* 174*  BUN 49* 47* 54* 57*  CREATININE 2.71* 2.90* 2.87* 2.81*  CALCIUM 8.7  --  8.0* 8.4   Liver Function Tests:  Recent Labs Lab 01/05/13 0440  AST 16  ALT 11  ALKPHOS 166*  BILITOT 0.5  PROT 6.4  ALBUMIN 2.8*   CBC:  Recent Labs Lab 01/05/13 0440 01/05/13 0451 01/05/13 1550 01/06/13 0430  WBC 6.0  --  21.0* 22.1*  NEUTROABS  --   --   --  20.5*  HGB 11.8* 12.9* 9.7* 9.6*  HCT 35.9* 38.0* 30.1* 29.2*  MCV 88.9  --  89.1 88.0  PLT 127*  --  100* 88*   Cardiac Enzymes: No results found for this basename: CKTOTAL, CKMB, CKMBINDEX, TROPONINI,  in the last 168 hours BNP (last 3 results)  Recent Labs  01/05/13 1600  PROBNP 8741.0*   CBG:  Recent Labs  Lab 01/05/13 0900 01/05/13 1348 01/05/13 1624 01/05/13 2228 01/06/13 0739  GLUCAP 204* 188* 185* 185* 139*    Recent Results (from the past 240 hour(s))  CLOSTRIDIUM DIFFICILE BY PCR     Status: Abnormal   Collection Time    01/05/13  5:25 AM      Result Value Range Status   C difficile by pcr POSITIVE (*) NEGATIVE Final   Comment: CRITICAL RESULT CALLED TO, READ BACK BY AND VERIFIED WITH:     SHEPHERD RN 9:35 01/05/13 ([email or web address])     Performed at [facility]  MRSA PCR SCREENING     Status: None   Collection Time    01/05/13  9:06 AM      Result Value Range Status   MRSA by PCR NEGATIVE  NEGATIVE Final   Comment:            The GeneXpert MRSA Assay (FDA     approved for NASAL specimens     only), is one component of a     comprehensive MRSA colonization     surveillance program. It is not     intended to diagnose MRSA     infection nor to guide or     monitor treatment for     MRSA infections.  CULTURE, BLOOD (ROUTINE X 2)     Status: None   Collection Time    01/05/13  9:30 AM      Result Value Range Status   Specimen Description BLOOD LEFT ARM   Final   Special Requests BOTTLES DRAWN AEROBIC AND ANAEROBIC 10CC   Final   Culture  Setup Time     Final   Value: 01/05/2013 14:04     Performed at [facility]   Culture     Final   Value: GRAM NEGATIVE RODS     Note: Gram Stain Report Called to,Read Back By and Verified With: Lezlie Lye [id number] BRMEL     Performed at [facility]   Report Status PENDING   Incomplete  CULTURE, BLOOD (ROUTINE X 2)     Status: None   Collection Time    01/05/13  9:35 AM      Result Value Range Status   Specimen Description BLOOD LEFT HAND   Final   Special Requests BOTTLES DRAWN AEROBIC ONLY 5CC   Final   Culture  Setup Time     Final   Value: 01/05/2013 14:04     Performed at [facility]   Culture     Final   Value:        BLOOD CULTURE RECEIVED NO GROWTH TO DATE CULTURE WILL BE HELD FOR 5 DAYS BEFORE ISSUING A FINAL NEGATIVE REPORT     Performed at [facility]   Report Status PENDING   Incomplete  URINE CULTURE     Status: None   Collection Time    01/05/13 10:27 AM      Result Value Range Status   Specimen Description URINE, CATHETERIZED   Final   Special Requests NONE   Final   Culture  Setup Time     Final   Value: 01/05/2013 14:03     Performed at [facility] Count     Final   Value: >=100,000 COLONIES/ML      Performed at [facility]   Culture     Final   Value: ESCHERICHIA COLI     Performed  at [facility]   Report Status PENDING   Incomplete     Studies: Ct Abdomen Pelvis Wo Contrast  01/05/2013   CLINICAL DATA:  73 year old male with fever and abdominal pain. Renal insufficiency precludes IV contrast. Initial encounter.  EXAM: CT ABDOMEN AND PELVIS WITHOUT CONTRAST  TECHNIQUE: Multidetector CT imaging of the abdomen and pelvis was performed following the standard protocol without intravenous contrast.  COMPARISON:  Portable abdomen radiographs from the same day.  FINDINGS: Mild motion artifact. Confluent left lower lobe and posterior lingula pulmonary opacity, somewhat beyond that typically seen for simple atelectasis. Cardiomegaly. No pericardial or pleural effusion. Mild atelectasis at the right lung base.  Chronic appearing right lower posterior rib fractures. Scoliosis and advanced degenerative changes in the spine. Previous lumbosacral junction posterior and interbody fusion. Previous partial laminectomy in the upper lumbar spine. No acute osseous abnormality identified.  Mild presacral stranding (series 2, image 77) contiguous with inflammation in the retroperitoneal space along the left pelvic sidewall tracking to the lower abdomen. Inflammation tracks along the left gutter and there is thickening of the left para renal fascia.  There is in a regular 7 x 7 mm obstructing calculus in the left ureter at the L4-L5 disc level. There is upstream hydroureter and periureteral stranding. There is left hydronephrosis with a subtle fluid fluid level in the left hemipelvis (series 2, image 40). There are additional nonobstructing left intra renal calculi. Left perinephric stranding is moderate.  Beyond the obstruction, the distal left ureter appears normal. The bladder is within normal limits. Right nephrolithiasis without obstruction.  There is mild wall thickening of the distal colon, most  pronounced in the sigmoid. There are sigmoid diverticula. Still, this segment is under distended. The left colon is also decompressed with diverticula.  More normal appearance of the transverse colon and right: . Appendix appears to be surgically absent. No dilated small bowel. Negative stomach and duodenum. Negative non contrast liver, gallbladder, spleen, pancreas and right adrenal gland. There is a 3 cm left adrenal low density nodule (12 Hounsfield units). No abdominal free fluid or free air.  IMPRESSION: 1. Acute obstructive uropathy on the left, with a 7 x 7 mm calculus at the L4-L5 disc level. Moderate to severe perinephric and periureteric stranding plus a fluid-fluid level in the left renal pelvis (may reflect hematuria).  2. Stranding along the left pericolic gutter and retroperitoneal space tracking to the presacral space all felt to be related to #1. There is diverticulosis of the sigmoid and descending colon, but I do not favor superimposed colitis at this time.  3. Confluent opacity at the left lung base, favor atelectasis in light of #1, but developing left lung base infection difficult to exclude.  4.  Additional bilateral nephrolithiasis.  5. Low-density 3 cm left adrenal nodule, favor benign adenoma.   Electronically Signed   By: Augusto Gamble M.D.   On: 01/05/2013 08:56   Dg Chest 1 View  01/06/2013   CLINICAL DATA:  Wheezing and shortness of breath.  EXAM: CHEST - 1 VIEW  COMPARISON:  01/07/2003 at 0514 hr  FINDINGS: Jugular central line in the SVC region and unchanged. Again noted are low lung volumes with prominent lung markings suggesting edema. Left basilar densities are suggestive for consolidation and pleural fluid. Heart size remains upper limits of normal but unchanged. No evidence for a large pneumothorax. Again noted is a right shoulder replacement.  IMPRESSION: Stable chest radiograph findings. Persistent left basilar densities probably represent consolidation and  pleural fluid.  Low  lung volumes and cannot exclude interstitial pulmonary edema.   Electronically Signed   By: Richarda Overlie M.D.   On: 01/06/2013 13:32   Dg Chest Port 1 View  01/06/2013   CLINICAL DATA:  Evaluate for infiltrates.  EXAM: PORTABLE CHEST - 1 VIEW  COMPARISON:  01/05/2013  FINDINGS: Right IJ central venous catheter has tip over the SVC just below the level of the carinal. Lungs are hypoinflated with slight worsening left basilar opacification and mild bilateral perihilar opacification. There likely a small amount of left pleural fluid present. There is mild stable cardiomegaly. Remainder of the exam is unchanged.  IMPRESSION: Worsening left basilar opacification which may be due to small effusion with atelectasis although cannot exclude infection in the left base. Slight worsening bilateral perihilar opacification as cannot exclude mild interstitial edema.  Stable cardiomegaly.  Right IJ central venous catheter with tip over the SVC.   Electronically Signed   By: Elberta Fortis M.D.   On: 01/06/2013 07:28   Dg Chest Port 1 View  01/05/2013   CLINICAL DATA:  Line placement  EXAM: PORTABLE CHEST - 1 VIEW  COMPARISON:  01/05/2013 322 hrs  FINDINGS: Cardiac shadow is stable. A right-sided jugular central line is now seen with the tip in the mid right atrium. No pneumothorax is seen. The overall inspiratory effort is poor with evidence of mild vascular congestion. Left basilar atelectasis is again noted.  IMPRESSION: No pneumothorax following central line placement.   Electronically Signed   By: Alcide Clever M.D.   On: 01/05/2013 11:30   Dg Chest Portable 1 View  01/05/2013   CLINICAL DATA:  Fever of 104.  Diarrhea and confusion.  EXAM: PORTABLE CHEST - 1 VIEW  COMPARISON:  None.  FINDINGS: Shallow inspiration. Heart size and pulmonary vascularity are probably normal for technique. There is suggestion of ulceration 0 left hemidiaphragm suggesting a small pleural effusion and basilar infiltration. Pneumonia could have  this appearance. No apparent pneumothorax. Postoperative change in the right shoulder.  IMPRESSION: Technically limited study but suggestion of infiltration and effusion on the left. Changes likely represent left lower lobe pneumonia appear   Electronically Signed   By: Burman Nieves M.D.   On: 01/05/2013 04:03   Dg Abd Portable 1v  01/05/2013   CLINICAL DATA:  Fever, diarrhea, confusion.  EXAM: PORTABLE ABDOMEN - 1 VIEW  COMPARISON:  Technically limited study due to patient positioning.  FINDINGS: Paucity of gas in the abdomen. No small or large bowel distention is suggested. There are healing fractures of the right 11th and 12th ribs. Scoliosis and degenerative changes of the lumbar spine with postoperative changes in the lower lumbar spine. Pelvis appears intact.  IMPRESSION: Technically limited study. Relatively gasless abdomen without evidence of obstruction.   Electronically Signed   By: Burman Nieves M.D.   On: 01/05/2013 04:07    Scheduled Meds: . antiseptic oral rinse  15 mL Mouth Rinse q12n4p  . aztreonam  1 g Intravenous Q8H  . chlorhexidine  15 mL Mouth Rinse BID  . furosemide  20 mg Intravenous Q12H  . insulin aspart  0-15 Units Subcutaneous TID WC  . levofloxacin (LEVAQUIN) IV  750 mg Intravenous Q48H  . metronidazole  500 mg Intravenous Q8H  . oseltamivir  30 mg Oral Daily  . pantoprazole (PROTONIX) IV  40 mg Intravenous Q24H  . vancomycin  500 mg Oral Q6H   Continuous Infusions: . norepinephrine (LEVOPHED) Adult infusion Stopped (01/05/13 1500)  Principal Problem:   Septic shock(785.52) Active Problems:   Severe sepsis(995.92)   Diarrhea   HCAP (healthcare-associated pneumonia)   Renal stone   Hydronephrosis of left kidney   Altered mental status   DVT (deep venous thrombosis)   DM (diabetes mellitus), type 2 with renal complications   AKI (acute kidney injury)   CKD (chronic kidney disease)   C. difficile diarrhea   Influenza A    Time spent: 35  minutes    Henry Wyatt,Henry Wyatt  [facility] Pager [phone]. If 7PM-7AM, please contact night-coverage at [email or web address], password [email or web address] 01/06/2013, 3:42 PM  LOS: 1 day

## 2013-01-07 ENCOUNTER — Encounter (HOSPITAL_COMMUNITY): Payer: Self-pay | Admitting: Urology

## 2013-01-07 DIAGNOSIS — I517 Cardiomegaly: Secondary | ICD-10-CM

## 2013-01-07 DIAGNOSIS — I5031 Acute diastolic (congestive) heart failure: Secondary | ICD-10-CM

## 2013-01-07 DIAGNOSIS — I509 Heart failure, unspecified: Secondary | ICD-10-CM

## 2013-01-07 DIAGNOSIS — I5041 Acute combined systolic (congestive) and diastolic (congestive) heart failure: Secondary | ICD-10-CM

## 2013-01-07 DIAGNOSIS — N10 Acute tubulo-interstitial nephritis: Secondary | ICD-10-CM

## 2013-01-07 DIAGNOSIS — R509 Fever, unspecified: Secondary | ICD-10-CM

## 2013-01-07 LAB — GLUCOSE, CAPILLARY
Glucose-Capillary: 79 mg/dL (ref 70–99)
Glucose-Capillary: 114 mg/dL — ABNORMAL HIGH (ref 70–99)
Glucose-Capillary: 152 mg/dL — ABNORMAL HIGH (ref 70–99)
Glucose-Capillary: 122 mg/dL — ABNORMAL HIGH (ref 70–99)

## 2013-01-07 LAB — CBC
HEMATOCRIT: 28.9 % — AB (ref 39.0–52.0)
HEMOGLOBIN: 9.9 g/dL — AB (ref 13.0–17.0)
MCH: 29.7 pg (ref 26.0–34.0)
MCHC: 34.3 g/dL (ref 30.0–36.0)
MCV: 86.8 fL (ref 78.0–100.0)
Platelets: 116 10*3/uL — ABNORMAL LOW (ref 150–400)
RBC: 3.33 MIL/uL — AB (ref 4.22–5.81)
RDW: 15.7 % — ABNORMAL HIGH (ref 11.5–15.5)
WBC: 21.1 10*3/uL — ABNORMAL HIGH (ref 4.0–10.5)

## 2013-01-07 LAB — BASIC METABOLIC PANEL
BUN: 63 mg/dL — ABNORMAL HIGH (ref 6–23)
CHLORIDE: 105 meq/L (ref 96–112)
CO2: 20 meq/L (ref 19–32)
CREATININE: 2.65 mg/dL — AB (ref 0.50–1.35)
Calcium: 8.5 mg/dL (ref 8.4–10.5)
GFR calc non Af Amer: 22 mL/min — ABNORMAL LOW (ref >90)
GFR, EST AFRICAN AMERICAN: 26 mL/min — AB (ref >90)
Glucose, Bld: 97 mg/dL (ref 70–99)
POTASSIUM: 4 meq/L (ref 3.7–5.3)
SODIUM: 139 meq/L (ref 137–147)

## 2013-01-07 LAB — LEGIONELLA ANTIGEN, URINE: Legionella Antigen, Urine: NEGATIVE

## 2013-01-07 LAB — VANCOMYCIN, RANDOM: VANCOMYCIN RM: 11.3 ug/mL

## 2013-01-07 MED ORDER — OSELTAMIVIR PHOSPHATE 75 MG PO CAPS
75.0000 mg | ORAL_CAPSULE | Freq: Every day | ORAL | Status: DC
  Administered 2013-01-08: 75 mg via ORAL
  Filled 2013-01-07: qty 1

## 2013-01-07 MED ORDER — PANTOPRAZOLE SODIUM 40 MG PO TBEC
40.0000 mg | DELAYED_RELEASE_TABLET | Freq: Every day | ORAL | Status: DC
  Administered 2013-01-07 – 2013-01-08 (×2): 40 mg via ORAL
  Filled 2013-01-07 (×2): qty 1

## 2013-01-07 MED ORDER — BOOST / RESOURCE BREEZE PO LIQD
1.0000 | Freq: Two times a day (BID) | ORAL | Status: DC
  Administered 2013-01-08 – 2013-01-13 (×8): 1 via ORAL

## 2013-01-07 NOTE — Progress Notes (Signed)
2 Days Post-Op Subjective: Patient Continues to improve.  Hemodynamically stable.  Good urine output.  Objective: Vital signs in last 24 hours: Temp:  [99.3 F (37.4 C)-100.6 F (38.1 C)] 99.5 F (37.5 C) (01/02 1300) Pulse Rate:  [39-104] 81 (01/02 1300) Resp:  [14-25] 21 (01/02 1300) BP: (86-135)/(45-103) 118/77 mmHg (01/02 1300) SpO2:  [90 %-95 %] 93 % (01/02 1300) Weight:  [224 lb 13.9 oz (102 kg)] 224 lb 13.9 oz (102 kg) (01/02 0400)  Intake/Output from previous day: 01/01 0701 - 01/02 0700 In: 1130 [IV Piggyback:650] Out: 2600 [Urine:2600] Intake/Output this shift: Total I/O In: 1005 [P.O.:735; Other:120; IV Piggyback:150] Out: 700 [Urine:700]  Physical Exam:  Constitutional: Vital signs reviewed. WD WN in NAD   Eyes: PERRL, No scleral icterus.   Cardiovascular: RRR Pulmonary/Chest: Normal effort Abdominal: Soft. Genitourinary:Catheter draining relatively clear urine. Extremities: No cyanosis or edema   Lab Results:  Recent Labs  01/06/13 0430 01/06/13 1735 01/07/13 0430  HGB 9.6* 10.1* 9.9*  HCT 29.2* 30.0* 28.9*   BMET  Recent Labs  01/06/13 0430 01/07/13 0430  NA 134* 139  K 5.0 4.0  CL 100 105  CO2 20 20  GLUCOSE 174* 97  BUN 57* 63*  CREATININE 2.81* 2.65*  CALCIUM 8.4 8.5   No results found for this basename: LABPT, INR,  in the last 72 hours No results found for this basename: LABURIN,  in the last 72 hours Results for orders placed during the hospital encounter of 01/05/13  CLOSTRIDIUM DIFFICILE BY PCR     Status: Abnormal   Collection Time    01/05/13  5:25 AM      Result Value Range Status   C difficile by pcr POSITIVE (*) NEGATIVE Final   Comment: CRITICAL RESULT CALLED TO, READ BACK BY AND VERIFIED WITH:     SHEPHERD RN 9:35 01/05/13 ([email or web address])     Performed at [facility]  MRSA PCR SCREENING     Status: None   Collection Time    01/05/13  9:06 AM      Result Value Range Status   MRSA by PCR NEGATIVE  NEGATIVE Final   Comment:            The GeneXpert MRSA Assay (FDA     approved for NASAL specimens     only), is one component of a     comprehensive MRSA colonization     surveillance program. It is not     intended to diagnose MRSA     infection nor to guide or     monitor treatment for     MRSA infections.  CULTURE, BLOOD (ROUTINE X 2)     Status: None   Collection Time    01/05/13  9:30 AM      Result Value Range Status   Specimen Description BLOOD LEFT ARM   Final   Special Requests BOTTLES DRAWN AEROBIC AND ANAEROBIC 10CC   Final   Culture  Setup Time     Final   Value: 01/05/2013 14:04     Performed at [facility]   Culture     Final   Value: GRAM NEGATIVE RODS     Note: Gram Stain Report Called to,Read Back By and Verified With: Lezlie LyeLISA FREI [id number] BRMEL     Performed at [facility]   Report Status PENDING   Incomplete  CULTURE, BLOOD (ROUTINE X 2)     Status: None   Collection Time  01/05/13  9:35 AM      Result Value Range Status   Specimen Description BLOOD LEFT HAND   Final   Special Requests BOTTLES DRAWN AEROBIC ONLY 5CC   Final   Culture  Setup Time     Final   Value: 01/05/2013 14:04     Performed at [facility]   Culture     Final   Value:        BLOOD CULTURE RECEIVED NO GROWTH TO DATE CULTURE WILL BE HELD FOR 5 DAYS BEFORE ISSUING A FINAL NEGATIVE REPORT     Performed at [facility]   Report Status PENDING   Incomplete  URINE CULTURE     Status: None   Collection Time    01/05/13 10:27 AM      Result Value Range Status   Specimen Description URINE, CATHETERIZED   Final   Special Requests NONE   Final   Culture  Setup Time     Final   Value: 01/05/2013 14:03     Performed at [facility] Count     Final   Value: >=100,000 COLONIES/ML     Performed at [facility]   Culture     Final   Value: ESCHERICHIA COLI     Performed at [facility]   Report Status 01/07/2013 FINAL   Final    Organism ID, Bacteria ESCHERICHIA COLI   Final    Studies/Results: Dg Chest 1 View  01/06/2013   CLINICAL DATA:  Wheezing and shortness of breath.  EXAM: CHEST - 1 VIEW  COMPARISON:  01/07/2003 at 0514 hr  FINDINGS: Jugular central line in the SVC region and unchanged. Again noted are low lung volumes with prominent lung markings suggesting edema. Left basilar densities are suggestive for consolidation and pleural fluid. Heart size remains upper limits of normal but unchanged. No evidence for a large pneumothorax. Again noted is a right shoulder replacement.  IMPRESSION: Stable chest radiograph findings. Persistent left basilar densities probably represent consolidation and pleural fluid.  Low lung volumes and cannot exclude interstitial pulmonary edema.   Electronically Signed   By: Richarda Overlie M.D.   On: 01/06/2013 13:32   Dg Chest Port 1 View  01/06/2013   CLINICAL DATA:  Evaluate for infiltrates.  EXAM: PORTABLE CHEST - 1 VIEW  COMPARISON:  01/05/2013  FINDINGS: Right IJ central venous catheter has tip over the SVC just below the level of the carinal. Lungs are hypoinflated with slight worsening left basilar opacification and mild bilateral perihilar opacification. There likely a small amount of left pleural fluid present. There is mild stable cardiomegaly. Remainder of the exam is unchanged.  IMPRESSION: Worsening left basilar opacification which may be due to small effusion with atelectasis although cannot exclude infection in the left base. Slight worsening bilateral perihilar opacification as cannot exclude mild interstitial edema.  Stable cardiomegaly.  Right IJ central venous catheter with tip over the SVC.   Electronically Signed   By: Elberta Fortis M.D.   On: 01/06/2013 07:28    Assessment/Plan:   Urosepsis with Escherichia coli.  Status post JJ stent.  Patient will need to eventually be discharged on appropriate 10-14 day course of antibiotics orally.  He should also then be placed on a daily  suppressive dose.  He will need follow-up for definitive stone management approximately 2-3 weeks status post discharge   LOS: 2 days   Alvester Eads S 01/07/2013, 2:01 PM

## 2013-01-07 NOTE — Anesthesia Postprocedure Evaluation (Signed)
  Anesthesia Post-op Note  Patient: Henry Wyatt  Procedure(s) Performed: Procedure(s) (LRB): CYSTOSCOPY WITH RETROGRADE PYELOGRAM/URETERAL STENT PLACEMENT (Left)  Patient Location: PACU  Anesthesia Type: MAC  Level of Consciousness: awake and alert   Airway and Oxygen Therapy: Patient Spontanous Breathing  Post-op Pain: mild  Post-op Assessment: Post-op Vital signs reviewed, Patient's Cardiovascular Status Stable, Respiratory Function Stable, Patent Airway and No signs of Nausea or vomiting  Last Vitals:  Filed Vitals:   01/07/13 1500  BP: 103/55  Pulse: 77  Temp: 37.4 C  Resp: 16    Post-op Vital Signs: stable   Complications: No apparent anesthesia complications

## 2013-01-07 NOTE — Progress Notes (Signed)
  Echocardiogram 2D Echocardiogram has been performed.  Madeline Bebout 01/07/2013, 2:24 PM

## 2013-01-07 NOTE — Progress Notes (Signed)
INITIAL NUTRITION ASSESSMENT  DOCUMENTATION CODES Per approved criteria  -Not Applicable   INTERVENTION: - Diet advancement per MD - Resource Breeze BID - Will continue to monitor   NUTRITION DIAGNOSIS: Inadequate oral intake related to clear liquid diet as evidenced by diet order.   Goal: Advance diet as tolerated to diabetic diet  Monitor:  Weights, labs, diet advancement  Reason for Assessment: Malnutrition screening tool   73 y.o. male  Admitting Dx: Septic shock(785.52)  ASSESSMENT: Pt is a nursing home resident, with history of hypertension and diabetes, sent for evaluation for fever and change in mental status. Patient is confused but he was noted to have diarrhea which he has not had any today per RN. Found to have C. Difficile and positive flu screen influenza A. Pt with urosepsis and midureteral stone  And had cytoscopy with left retrograde pyelogram and left double J stent 12/31. Pt on clear liquid diet which he reports tolerating well, requested Ginger-ale which was provided. Pt reports he was not eating anything for a few days PTA. Pt thinks he has lost weight but is unsure how much.   BUN/Cr elevated with low GFR - pt with acute kidney injury per MD Alk phos elevated    Height: Ht Readings from Last 1 Encounters:  01/05/13 6' 1" (1.854 m)    Weight: Wt Readings from Last 1 Encounters:  01/07/13 224 lb 13.9 oz (102 kg)    Ideal Body Weight: 184 lb   % Ideal Body Weight: 122%  Wt Readings from Last 10 Encounters:  01/07/13 224 lb 13.9 oz (102 kg)  01/07/13 224 lb 13.9 oz (102 kg)    Usual Body Weight: Pt unsure    BMI:  Body mass index is 29.67 kg/(m^2).  Estimated Nutritional Needs: Kcal: 3710-6269 Protein: 100-125g Fluid: 2.3-2.5L/day  Skin: +1 generalized edema, non-pitting RUE, LUE edema, +2 RLE, LLE edema, stage 2 coccyx pressure ulcer,   Diet Order: Clear Liquid  EDUCATION NEEDS: -No education needs identified at this  time   Intake/Output Summary (Last 24 hours) at 01/07/13 1059 Last data filed at 01/07/13 0900  Gross per 24 hour  Intake   1415 ml  Output   3300 ml  Net  -1885 ml    Last BM: 12/31  Labs:   Recent Labs Lab 01/05/13 1550 01/06/13 0430 01/07/13 0430  NA 135* 134* 139  K 4.9 5.0 4.0  CL 100 100 105  CO2 18* 20 20  BUN 54* 57* 63*  CREATININE 2.87* 2.81* 2.65*  CALCIUM 8.0* 8.4 8.5  GLUCOSE 206* 174* 97    CBG (last 3)   Recent Labs  01/06/13 1641 01/06/13 2214 01/07/13 0752  GLUCAP 86 93 79    Scheduled Meds: . antiseptic oral rinse  15 mL Mouth Rinse q12n4p  . aztreonam  1 g Intravenous Q8H  . chlorhexidine  15 mL Mouth Rinse BID  . furosemide  20 mg Intravenous Q12H  . insulin aspart  0-15 Units Subcutaneous TID WC  . levofloxacin (LEVAQUIN) IV  750 mg Intravenous Q48H  . metronidazole  500 mg Intravenous Q8H  . [START ON 01/08/2013] oseltamivir  75 mg Oral Daily  . pantoprazole  40 mg Oral Q1200  . vancomycin  500 mg Oral Q6H    Continuous Infusions:   Past Medical History  Diagnosis Date  . Hypertension   . Diabetes mellitus without complication   . Constipation   . DVT (deep venous thrombosis)   . Back pain   .  Kidney injury     History reviewed. No pertinent past surgical history.   Baron MS, RD, LDN [phone] Pager [phone] After Hours Pager  

## 2013-01-07 NOTE — Progress Notes (Signed)
Dr. Sharon SellerMcClung called to update on pts status. Patient has been in NSR throughout night and switched to ventricular bigemeny for about 20 minutes. Vital signs are stable. No new orders given. Will continue to monitor.

## 2013-01-07 NOTE — Progress Notes (Signed)
PHARMACIST - PHYSICIAN COMMUNICATION CONCERNING:  Oseltamivir 30 mg capsule shortage  DESCRIPTION:  [place] is experiencing a significant shortage of Oseltamivir 30mg  capsules.    This patient has an order for Oseltamivir (Tamiflu) 30 mg daily and SCr has improved (estimated now CrCl~ 35 ml/min), warranting a dose adjustment.  The package insert for Oseltamivir recommends 30mg  BID x 5 days for patients with CrCl 30-60 ml/min.  To preserve an adequate supply of 30mg  capsules for our patients with more significant renal impairment the Infectious Disease team has recommended to substitute Oseltamivir 75mg  qday x 5 days for patients with CrCl 30-60 ml/min at this time.   RECOMMENDATION: Oseltamivir 75mg  PO DAILY has been substituted for your patient.  Recommended 5 day stop date has not been ordered since patient is critically ill and will defer length of treatment to MD. If you have any questions about this temporary substitution please feel free to call the Pharmacy at [phone] for assistance.  Thank you.  Clance BollAmanda Shaeleigh Graw, PharmD, BCPS Pager: [phone] 01/07/2013 9:23 AM

## 2013-01-07 NOTE — Evaluation (Signed)
Clinical/Bedside Swallow Evaluation Patient Details  Name: Henry Wyatt MRN: [id number] Date of Birth: October 09, 1940  Today's Date: 01/07/2013 Time: 1530-1600 SLP Time Calculation (min): 30 min  Past Medical History:  Past Medical History  Diagnosis Date  . Hypertension   . Diabetes mellitus without complication   . Constipation   . DVT (deep venous thrombosis)   . Back pain   . Kidney injury    Past Surgical History:  Past Surgical History  Procedure Laterality Date  . Cystoscopy w/ ureteral stent placement Left 01/05/2013    Procedure: CYSTOSCOPY WITH RETROGRADE PYELOGRAM/URETERAL STENT PLACEMENT;  Surgeon: Valetta Fuller, MD;  Location: [facility];  Service: Urology;  Laterality: Left;   HPI:  73 yo male resident of snf admitted with ams, found to have flu, cdif and be septic.  PMH + for dysarthria per snf referral form, DM, HTN.  Pt underwent cystoscopy with stent placement.  MD ordered bedside swallow evaluation.     Assessment / Plan / Recommendation Clinical Impression  Pt's largest aspiration risk factors are pt's mentation and his respiratory status.  Pt admits to occasional coughing with po prior to admission.  No focal CN deficits apparent, however pt has diagnosis of "dysarthria" at SNF.  Swallow was mildly delayed - suspect possibly cognitive with delayed cough after thin liquid via straw x1/5.  Small single boluses tolerated better.  SLP observed pt with small single bite of graham cracker, 2 1/4 tsps applesauce, 1 ounce grape juice and 1 ounce cranberry juice.  Mildly increased work of breathing noted.  Skilled intervention included educating pt to effective compensation strategies.  Pt became sleepy during evaluation and closed his eyes- causing SLP to end session due to asp risk.  Anticipate pt swallow to improve significantly with mentation improvement.  In the interim, recommend very strict aspiration precautions.  SLP to follow for family education, po diet tolerance,  indication for further testing.     Aspiration Risk  Moderate    Diet Recommendation Dysphagia 3 (Mechanical Soft);Thin liquid (when advance diet - rec soft for energy conservation)   Liquid Administration via: Cup;Straw Medication Administration:  (as tolerated, with applesauce if problematic) Supervision: Full supervision/cueing for compensatory strategies;Staff to assist with self feeding Compensations: Slow rate;Small sips/bites;Check for pocketing Postural Changes and/or Swallow Maneuvers: Seated upright 90 degrees;Upright 30-60 min after meal    Other  Recommendations Oral Care Recommendations: Oral care BID   Follow Up Recommendations  Skilled Nursing facility    Frequency and Duration min 1 x/week  1 week   Pertinent Vitals/Pain Afebrile,decreased      Swallow Study Prior Functional Status   pt on regular diet at [facility] per his report    General Date of Onset: 01/05/13 HPI: 73 yo male resident of snf admitted with ams, found to have flu, cdif and be septic.  PMH + for dysarthria per snf referral form, DM, HTN.  Pt underwent cystoscopy with stent placement.  MD ordered bedside swallow evaluation.   Type of Study: Bedside swallow evaluation Previous Swallow Assessment: none Diet Prior to this Study: Thin liquids (clears) Temperature Spikes Noted: Yes Respiratory Status: Nasal cannula History of Recent Intubation: No Behavior/Cognition: Alert;Cooperative;Confused Oral Cavity - Dentition: Adequate natural dentition Self-Feeding Abilities: Needs assist Patient Positioning: Postural control interferes with function Baseline Vocal Quality: Clear Volitional Cough: Strong Volitional Swallow: Able to elicit    Oral/Motor/Sensory Function Overall Oral Motor/Sensory Function: Impaired (generalized weakness, no focal deficits)   Ice Chips Ice chips: Impaired Oral Phase  Impairments: Reduced lingual movement/coordination Oral Phase Functional Implications: Prolonged oral  transit Pharyngeal Phase Impairments: Suspected delayed Swallow   Thin Liquid Thin Liquid: Impaired Presentation: Cup;Straw;Spoon Oral Phase Impairments: Impaired anterior to posterior transit Pharyngeal  Phase Impairments: Suspected delayed Swallow;Cough - Delayed Other Comments: delayed cough with large boluses, small single boluses tolerated better    Nectar Thick Nectar Thick Liquid: Not tested   Honey Thick Honey Thick Liquid: Not tested   Puree Puree: Impaired Presentation: Spoon Oral Phase Impairments: Impaired anterior to posterior transit Oral Phase Functional Implications: Prolonged oral transit Pharyngeal Phase Impairments: Suspected delayed Swallow   Solid   GO    Solid: Impaired (small single bite of food) Oral Phase Impairments: Impaired anterior to posterior transit;Reduced lingual movement/coordination Oral Phase Functional Implications: Other (comment) (slow mastication) Pharyngeal Phase Impairments: Suspected delayed Fredric MareSwallow      Rika Daughdrill, MS [facility] SLP [phone]

## 2013-01-07 NOTE — Evaluation (Signed)
Physical Therapy Evaluation Patient Details Name: Henry Wyatt MRN: [id number] DOB: 1940-12-15 Today's Date: 01/07/2013 Time: 4098-1191 PT Time Calculation (min): 35 min  PT Assessment / Plan / Recommendation History of Present Illness  Henry Wyatt  is a 73 y.o. male, nursing home resident, with history of R shoulder replacement, back surgery 3 yrs ago with progressive decline per wife,  hypertension and diabetes, sent from the nursing home for evaluation for fever and change in mental status. fluA positive, C diff positive, L obstructive pyelonephritis, s/p stent 12/31.  Clinical Impression  **Pt admitted with flu, ureter stone s/p stent 12/31, PNA, diarrhea*. Pt currently with functional limitations due to the deficits listed below (see PT Problem List).  Pt will benefit from skilled PT to increase their independence and safety with mobility to allow discharge to the venue listed below.   *    PT Assessment  Patient needs continued PT services    Follow Up Recommendations  SNF;Supervision/Assistance - 24 hour    Does the patient have the potential to tolerate intense rehabilitation      Barriers to Discharge        Equipment Recommendations  None recommended by PT    Recommendations for Other Services OT consult   Frequency Min 3X/week    Precautions / Restrictions Precautions Precautions: Fall Restrictions Weight Bearing Restrictions: No   Pertinent Vitals/Pain **VSS Pt denied pain*      Mobility  Bed Mobility Bed Mobility: Supine to Sit;Sit to Supine Supine to Sit: HOB elevated;2: Max assist Sit to Supine: 1: +2 Total assist Sit to Supine: Patient Percentage: 10% Details for Bed Mobility Assistance: assist to advance BLEs to EOB and to raise trunk for supine to sit; +2 to return to supine -required assist to bring LEs into bed and to control descent of trunk Transfers Transfers: Sit to Stand Sit to Stand: 1: +2 Total assist;From bed Sit to Stand: Patient  Percentage: 10% Details for Transfer Assistance: attempted sit to stand with RW and +2 assist x2 trials, pt unable to clear hips from bed Ambulation/Gait Ambulation/Gait Assistance: Not tested (comment)    Exercises     PT Diagnosis: Generalized weakness;Difficulty walking  PT Problem List: Decreased strength;Decreased range of motion;Decreased activity tolerance;Decreased mobility PT Treatment Interventions: Functional mobility training;Therapeutic exercise;Therapeutic activities     PT Goals(Current goals can be found in the care plan section) Acute Rehab PT Goals Patient Stated Goal: to be able to stand, and eventually to walk PT Goal Formulation: With patient/family Time For Goal Achievement: 01/21/13 Potential to Achieve Goals: Good  Visit Information  Last PT Received On: 01/07/13 Assistance Needed: +2 History of Present Illness: Henry Wyatt  is a 73 y.o. male, nursing home resident, with history of R shoulder replacement, back surgery 3 yrs ago with progressive decline per wife,  hypertension and diabetes, sent from the nursing home for evaluation for fever and change in mental status. fluA positive, C diff positive, L obstructive pyelonephritis, s/p stent 12/31.       Prior Functioning  Home Living Family/patient expects to be discharged to:: Skilled nursing facility Prior Function Level of Independence: Needs assistance Gait / Transfers Assistance Needed: Hasn't walked since July per wife. She stated he had back surgery 3 yrs ago and had decline with walking since then.  Was able to stand 5 minutes in PT at SNF prior to admission.  ADL's / Homemaking Assistance Needed: assist needed Communication Communication: No difficulties    Cognition  Cognition Arousal/Alertness: Awake/alert  Behavior During Therapy: WFL for tasks assessed/performed Overall Cognitive Status: History of cognitive impairments - at baseline    Extremity/Trunk Assessment Upper Extremity  Assessment Upper Extremity Assessment: RUE deficits/detail RUE Deficits / Details: h/o R shoulder replacement, shoulder elevation limited to approx 40* AAROM Lower Extremity Assessment Lower Extremity Assessment: LLE deficits/detail;RLE deficits/detail;Generalized weakness RLE Deficits / Details: knee ext -25* AAROM, strength -4/5 LLE Deficits / Details: knee ext -25* AAROM, strength -4/5 Cervical / Trunk Assessment Cervical / Trunk Assessment: Kyphotic   Balance Balance Balance Assessed: Yes Static Sitting Balance Static Sitting - Balance Support: Bilateral upper extremity supported;Feet supported Static Sitting - Level of Assistance: 5: Stand by assistance Static Sitting - Comment/# of Minutes: Pt sat 11 minutes at EOB.   End of Session PT - End of Session Activity Tolerance: Patient tolerated treatment well Patient left: in bed;with call bell/phone within reach;with family/visitor present Nurse Communication: Mobility status  GP     Ralene BatheUhlenberg, Rea Kalama Kistler 01/07/2013, 2:33 PM [phone]

## 2013-01-07 NOTE — Progress Notes (Signed)
[facility] PROGRESS NOTE  Henry SproutWilliam Wyatt WUJ:[id number] DOB: Dec 09, 1940 DOA: 01/05/2013 PCP: Henry Wyatt  Assessment/Plan: Principal Problem:   Septic shock(785.52): Blood cultures preliminary growing out gram-negative rods, presumably from urine which is growing out Cipro resistant Escherichia coli Active Problems:   Diarrhea: Secondary C. difficile   HCAP (healthcare-associated pneumonia): Questionable. Respiratory failure may be more secondary to volume overload and influenza, will check swallow eval to rule out aspiration    Renal stone: Status post renal extraction., On IV antibiotics. On Rocephin    Hydronephrosis of left kidney: Secondary to renal stone obstruction, resolved    Altered mental status in the setting of suspected mild underlying dementia. This is metabolic encephalopathy from sepsis from multiple sources of infection    DM (diabetes mellitus), type 2 with renal complications, sugar stable    Acute renal failure in the setting of CKD (chronic kidney disease): Unclear baseline, however slowly continues to improve  Acute systolic/diastolic congestive heart failure: BNP elevated and responding to gentle diuresis. Awaiting echocardiogram, has diuresed approximately the positive 1.5 L he was initially resuscitated with plus an additional 800 mL. Echocardiogram notes significantly decreased ejection fraction 35% plus grade 2 diastolic dysfunction    Influenza A: On Tamiflu  Code Status: DO NOT RESUSCITATE Family Communication: Left message with family  Disposition Plan: Showing some improvement in blood pressure, not yet stable   Consultants:  Critical care-signed off  Urology  Procedures:  Echocardiogram: Grade 2 diastolic dysfunction plus systolic dysfunction with EF of 35% J-J stent placed 12/31, was on norepi for several hours post-op but now weaned to off.    Antibiotics:  IV Azactam 12/31-present  IV Flagyl 12/31-present  IV  Levaquin 12/31-present  By mouth vancomycin 12/31-present  Tamiflu 12/31-present  IV vancomycin 12/31-discontinued  HPI/Subjective: Patient doing okay. Slightly more alert. Denies any pain  Objective: Filed Vitals:   01/07/13 1854  BP: 120/77  Pulse: 80  Temp: 98.8 F (37.1 C)  Resp: 24    Intake/Output Summary (Last 24 hours) at 01/07/13 1931 Last data filed at 01/07/13 1700  Gross per 24 hour  Intake   1830 ml  Output   2600 ml  Net   -770 ml   Filed Weights   01/05/13 0857 01/06/13 0530 01/07/13 0400  Weight: 102.1 kg (225 lb 1.4 oz) 104 kg (229 lb 4.5 oz) 102 kg (224 lb 13.9 oz)    Exam:   General:  More awake and interactive oriented x1  Cardiovascular: Regular rhythm, occasional PVCs  Respiratory: Decreased breath sounds at the bases  Abdomen: Soft, nontender?, Minimal distention, hypoactive bowel sounds  Musculoskeletal: No clubbing or cyanosis, trace edema bilaterally   Data Reviewed: Basic Metabolic Panel:  Recent Labs Lab 01/05/13 0440 01/05/13 0451 01/05/13 1550 01/06/13 0430 01/07/13 0430  NA 133* 137 135* 134* 139  K 3.9 4.0 4.9 5.0 4.0  CL 95* 100 100 100 105  CO2 21  --  18* 20 20  GLUCOSE 156* 156* 206* 174* 97  BUN 49* 47* 54* 57* 63*  CREATININE 2.71* 2.90* 2.87* 2.81* 2.65*  CALCIUM 8.7  --  8.0* 8.4 8.5   Liver Function Tests:  Recent Labs Lab 01/05/13 0440  AST 16  ALT 11  ALKPHOS 166*  BILITOT 0.5  PROT 6.4  ALBUMIN 2.8*   CBC:  Recent Labs Lab 01/05/13 0440 01/05/13 0451 01/05/13 1550 01/06/13 0430 01/06/13 1735 01/07/13 0430  WBC 6.0  --  21.0* 22.1* 20.9* 21.1*  NEUTROABS  --   --   --  20.5*  --   --   HGB 11.8* 12.9* 9.7* 9.6* 10.1* 9.9*  HCT 35.9* 38.0* 30.1* 29.2* 30.0* 28.9*  MCV 88.9  --  89.1 88.0 87.2 86.8  PLT 127*  --  100* 88* 114* 116*   Cardiac Enzymes: No results found for this basename: CKTOTAL, CKMB, CKMBINDEX, TROPONINI,  in the last 168 hours BNP (last 3 results)  Recent Labs   01/05/13 1600  PROBNP 8741.0*   CBG:  Recent Labs Lab 01/06/13 1641 01/06/13 2214 01/07/13 0752 01/07/13 1136 01/07/13 1650  GLUCAP 86 93 79 114* 152*    Recent Results (from the past 240 hour(s))  CLOSTRIDIUM DIFFICILE BY PCR     Status: Abnormal   Collection Time    01/05/13  5:25 AM      Result Value Range Status   C difficile by pcr POSITIVE (*) NEGATIVE Final   Comment: CRITICAL RESULT CALLED TO, READ BACK BY AND VERIFIED WITH:     SHEPHERD RN 9:35 01/05/13 ([email or web address])     Performed at [facility]  MRSA PCR SCREENING     Status: None   Collection Time    01/05/13  9:06 AM      Result Value Range Status   MRSA by PCR NEGATIVE  NEGATIVE Final   Comment:            The GeneXpert MRSA Assay (FDA     approved for NASAL specimens     only), is one component of a     comprehensive MRSA colonization     surveillance program. It is not     intended to diagnose MRSA     infection nor to guide or     monitor treatment for     MRSA infections.  CULTURE, BLOOD (ROUTINE X 2)     Status: None   Collection Time    01/05/13  9:30 AM      Result Value Range Status   Specimen Description BLOOD LEFT ARM   Final   Special Requests BOTTLES DRAWN AEROBIC AND ANAEROBIC 10CC   Final   Culture  Setup Time     Final   Value: 01/05/2013 14:04     Performed at [facility]   Culture     Final   Value: GRAM NEGATIVE RODS     Note: Gram Stain Report Called to,Read Back By and Verified With: Lezlie Lye [id number] BRMEL     Performed at [facility]   Report Status PENDING   Incomplete  CULTURE, BLOOD (ROUTINE X 2)     Status: None   Collection Time    01/05/13  9:35 AM      Result Value Range Status   Specimen Description BLOOD LEFT HAND   Final   Special Requests BOTTLES DRAWN AEROBIC ONLY 5CC   Final   Culture  Setup Time     Final   Value: 01/05/2013 14:04     Performed at [facility]   Culture     Final   Value:        BLOOD CULTURE RECEIVED  NO GROWTH TO DATE CULTURE WILL BE HELD FOR 5 DAYS BEFORE ISSUING A FINAL NEGATIVE REPORT     Performed at [facility]   Report Status PENDING   Incomplete  URINE CULTURE     Status: None   Collection Time    01/05/13 10:27 AM      Result Value Range Status  Specimen Description URINE, CATHETERIZED   Final   Special Requests NONE   Final   Culture  Setup Time     Final   Value: 01/05/2013 14:03     Performed at [facility]   Colony Count     Final   Value: >=100,000 COLONIES/ML     Performed at [facility]   Culture     Final   Value: ESCHERICHIA COLI     Performed at [facility]   Report Status 01/07/2013 FINAL   Final   Organism ID, Bacteria ESCHERICHIA COLI   Final     Studies: Dg Chest 1 View  01/06/2013   CLINICAL DATA:  Wheezing and shortness of breath.  EXAM: CHEST - 1 VIEW  COMPARISON:  01/07/2003 at 0514 hr  FINDINGS: Jugular central line in the SVC region and unchanged. Again noted are low lung volumes with prominent lung markings suggesting edema. Left basilar densities are suggestive for consolidation and pleural fluid. Heart size remains upper limits of normal but unchanged. No evidence for a large pneumothorax. Again noted is a right shoulder replacement.  IMPRESSION: Stable chest radiograph findings. Persistent left basilar densities probably represent consolidation and pleural fluid.  Low lung volumes and cannot exclude interstitial pulmonary edema.   Electronically Signed   By: Richarda Overlie M.D.   On: 01/06/2013 13:32   Dg Chest Port 1 View  01/06/2013   CLINICAL DATA:  Evaluate for infiltrates.  EXAM: PORTABLE CHEST - 1 VIEW  COMPARISON:  01/05/2013  FINDINGS: Right IJ central venous catheter has tip over the SVC just below the level of the carinal. Lungs are hypoinflated with slight worsening left basilar opacification and mild bilateral perihilar opacification. There likely a small amount of left pleural fluid present. There is mild  stable cardiomegaly. Remainder of the exam is unchanged.  IMPRESSION: Worsening left basilar opacification which may be due to small effusion with atelectasis although cannot exclude infection in the left base. Slight worsening bilateral perihilar opacification as cannot exclude mild interstitial edema.  Stable cardiomegaly.  Right IJ central venous catheter with tip over the SVC.   Electronically Signed   By: Elberta Fortis M.D.   On: 01/06/2013 07:28    Scheduled Meds: . antiseptic oral rinse  15 mL Mouth Rinse q12n4p  . aztreonam  1 g Intravenous Q8H  . chlorhexidine  15 mL Mouth Rinse BID  . [START ON 01/08/2013] feeding supplement (RESOURCE BREEZE)  1 Container Oral BID BM  . furosemide  20 mg Intravenous Q12H  . insulin aspart  0-15 Units Subcutaneous TID WC  . levofloxacin (LEVAQUIN) IV  750 mg Intravenous Q48H  . metronidazole  500 mg Intravenous Q8H  . [START ON 01/08/2013] oseltamivir  75 mg Oral Daily  . pantoprazole  40 mg Oral Q1200  . vancomycin  500 mg Oral Q6H   Continuous Infusions:    Principal Problem:   Septic shock(785.52) Active Problems:   Severe sepsis(995.92)   Diarrhea   HCAP (healthcare-associated pneumonia)   Renal stone   Hydronephrosis of left kidney   Altered mental status   DVT (deep venous thrombosis)   DM (diabetes mellitus), type 2 with renal complications   AKI (acute kidney injury)   CKD (chronic kidney disease)   C. difficile diarrhea   Influenza A    Time spent: 25 minutes    Hollice Espy  [facility] Pager [phone]. If 7PM-7AM, please contact night-coverage at [email or web address], password [facility] 01/07/2013, 7:31 PM  LOS: 2 days

## 2013-01-07 NOTE — Progress Notes (Signed)
Name: Henry SproutWilliam Wyatt MRN: [id number] DOB: 1940/08/08    ADMISSION DATE:  01/05/2013 CONSULTATION DATE:  01/07/2013   REFERRING MD :  edp PRIMARY SERVICE: tr[facility]  CHIEF COMPLAINT:  Shock abd pain  BRIEF PATIENT DESCRIPTION:  73 y.o.M adm from snf with abd pain, septic shock > fluA positive, C diff positive, L obstructive pyelonephritis  SIGNIFICANT EVENTS / STUDIES:  ABD pelvic CT 12/31:  L ureter stone with hydronephrosis Left J-J stent placed 12/31 (Dr Isabel CapriceGrapey)  LINES / TUBES: PIV  CULTURES: BC x 2 12/31 >> GNR's >>  UC 12/31: E coli (sens pending) resp cult 12/31 C diff titer 12/31: positive    ANTIBIOTICS: Aztreonam 12/31 >>  Vanco IV 12/31 >> 12/31 Vanco PO 12/31 >> Flagyl IV 12/31 >>  levaquin 12/31 > 12/31  tamiflu 12/31 >>    SUBJECTIVE/Interval events:  Thirsty but no distress   VITAL SIGNS: Temp:  [99.3 F (37.4 C)-100.6 F (38.1 C)] 99.9 F (37.7 C) (01/02 0600) Pulse Rate:  [79-104] 90 (01/02 0600) Resp:  [14-25] 17 (01/02 0600) BP: (86-135)/(44-103) 125/73 mmHg (01/02 0600) SpO2:  [90 %-95 %] 93 % (01/02 0600) Weight:  [102 kg (224 lb 13.9 oz)] 102 kg (224 lb 13.9 oz) (01/02 0400) 6 liters  HEMODYNAMICS:   VENTILATOR SETTINGS: [facility]   INTAKE / OUTPUT: Intake/Output     01/01 0701 - 01/02 0700 01/02 0701 - 01/03 0700   I.V. (mL/kg)     Other 460    IV Piggyback 650    Total Intake(mL/kg) 1110 (10.9)    Urine (mL/kg/hr) 2600 (1.1)    Total Output 2600     Net -1490            PHYSICAL EXAMINATION: General:  Ill appearing WM disheveled  Neuro:  Alert, restless, but not in distress.  HEENT:  Dry mucus membranes Cardiovascular:  Tachy nl s1 s2 no s3 s4 Lungs:  Rales at bases, no accessory muscle use  Abdomen:  Soft NT  Musculoskeletal:  From  Skin:  clear  LABS:  CBC  Recent Labs Lab 01/06/13 0430 01/06/13 1735 01/07/13 0430  WBC 22.1* 20.9* 21.1*  HGB 9.6* 10.1* 9.9*  HCT 29.2* 30.0* 28.9*  PLT 88* 114* 116*    Coag's No results found for this basename: APTT, INR,  in the last 168 hours BMET  Recent Labs Lab 01/05/13 1550 01/06/13 0430 01/07/13 0430  NA 135* 134* 139  K 4.9 5.0 4.0  CL 100 100 105  CO2 18* 20 20  BUN 54* 57* 63*  CREATININE 2.87* 2.81* 2.65*  GLUCOSE 206* 174* 97   Electrolytes  Recent Labs Lab 01/05/13 1550 01/06/13 0430 01/07/13 0430  CALCIUM 8.0* 8.4 8.5   Sepsis Markers  Recent Labs Lab 01/05/13 0449  LATICACIDVEN 4.83*   ABG No results found for this basename: PHART, PCO2ART, PO2ART,  in the last 168 hours Liver Enzymes  Recent Labs Lab 01/05/13 0440  AST 16  ALT 11  ALKPHOS 166*  BILITOT 0.5  ALBUMIN 2.8*   Cardiac Enzymes  Recent Labs Lab 01/05/13 1600  PROBNP 8741.0*   Glucose  Recent Labs Lab 01/05/13 2228 01/06/13 0739 01/06/13 1211 01/06/13 1641 01/06/13 2214 01/07/13 0752  GLUCAP 185* 139* 102* 86 93 79    Imaging Ct Abdomen Pelvis Wo Contrast  01/05/2013   CLINICAL DATA:  73 year old male with fever and abdominal pain. Renal insufficiency precludes IV contrast. Initial encounter.  EXAM: CT ABDOMEN AND PELVIS  WITHOUT CONTRAST  TECHNIQUE: Multidetector CT imaging of the abdomen and pelvis was performed following the standard protocol without intravenous contrast.  COMPARISON:  Portable abdomen radiographs from the same day.  FINDINGS: Mild motion artifact. Confluent left lower lobe and posterior lingula pulmonary opacity, somewhat beyond that typically seen for simple atelectasis. Cardiomegaly. No pericardial or pleural effusion. Mild atelectasis at the right lung base.  Chronic appearing right lower posterior rib fractures. Scoliosis and advanced degenerative changes in the spine. Previous lumbosacral junction posterior and interbody fusion. Previous partial laminectomy in the upper lumbar spine. No acute osseous abnormality identified.  Mild presacral stranding (series 2, image 77) contiguous with inflammation in the  retroperitoneal space along the left pelvic sidewall tracking to the lower abdomen. Inflammation tracks along the left gutter and there is thickening of the left para renal fascia.  There is in a regular 7 x 7 mm obstructing calculus in the left ureter at the L4-L5 disc level. There is upstream hydroureter and periureteral stranding. There is left hydronephrosis with a subtle fluid fluid level in the left hemipelvis (series 2, image 40). There are additional nonobstructing left intra renal calculi. Left perinephric stranding is moderate.  Beyond the obstruction, the distal left ureter appears normal. The bladder is within normal limits. Right nephrolithiasis without obstruction.  There is mild wall thickening of the distal colon, most pronounced in the sigmoid. There are sigmoid diverticula. Still, this segment is under distended. The left colon is also decompressed with diverticula.  More normal appearance of the transverse colon and right: . Appendix appears to be surgically absent. No dilated small bowel. Negative stomach and duodenum. Negative non contrast liver, gallbladder, spleen, pancreas and right adrenal gland. There is a 3 cm left adrenal low density nodule (12 Hounsfield units). No abdominal free fluid or free air.  IMPRESSION: 1. Acute obstructive uropathy on the left, with a 7 x 7 mm calculus at the L4-L5 disc level. Moderate to severe perinephric and periureteric stranding plus a fluid-fluid level in the left renal pelvis (may reflect hematuria).  2. Stranding along the left pericolic gutter and retroperitoneal space tracking to the presacral space all felt to be related to #1. There is diverticulosis of the sigmoid and descending colon, but I do not favor superimposed colitis at this time.  3. Confluent opacity at the left lung base, favor atelectasis in light of #1, but developing left lung base infection difficult to exclude.  4.  Additional bilateral nephrolithiasis.  5. Low-density 3 cm left  adrenal nodule, favor benign adenoma.   Electronically Signed   By: Augusto Gamble M.D.   On: 01/05/2013 08:56   Dg Chest 1 View  01/06/2013   CLINICAL DATA:  Wheezing and shortness of breath.  EXAM: CHEST - 1 VIEW  COMPARISON:  01/07/2003 at 0514 hr  FINDINGS: Jugular central line in the SVC region and unchanged. Again noted are low lung volumes with prominent lung markings suggesting edema. Left basilar densities are suggestive for consolidation and pleural fluid. Heart size remains upper limits of normal but unchanged. No evidence for a large pneumothorax. Again noted is a right shoulder replacement.  IMPRESSION: Stable chest radiograph findings. Persistent left basilar densities probably represent consolidation and pleural fluid.  Low lung volumes and cannot exclude interstitial pulmonary edema.   Electronically Signed   By: Richarda Overlie M.D.   On: 01/06/2013 13:32   Dg Chest Port 1 View  01/06/2013   CLINICAL DATA:  Evaluate for infiltrates.  EXAM: PORTABLE  CHEST - 1 VIEW  COMPARISON:  01/05/2013  FINDINGS: Right IJ central venous catheter has tip over the SVC just below the level of the carinal. Lungs are hypoinflated with slight worsening left basilar opacification and mild bilateral perihilar opacification. There likely a small amount of left pleural fluid present. There is mild stable cardiomegaly. Remainder of the exam is unchanged.  IMPRESSION: Worsening left basilar opacification which may be due to small effusion with atelectasis although cannot exclude infection in the left base. Slight worsening bilateral perihilar opacification as cannot exclude mild interstitial edema.  Stable cardiomegaly.  Right IJ central venous catheter with tip over the SVC.   Electronically Signed   By: Elberta Fortis M.D.   On: 01/06/2013 07:28   Dg Chest Port 1 View  01/05/2013   CLINICAL DATA:  Line placement  EXAM: PORTABLE CHEST - 1 VIEW  COMPARISON:  01/05/2013 322 hrs  FINDINGS: Cardiac shadow is stable. A right-sided  jugular central line is now seen with the tip in the mid right atrium. No pneumothorax is seen. The overall inspiratory effort is poor with evidence of mild vascular congestion. Left basilar atelectasis is again noted.  IMPRESSION: No pneumothorax following central line placement.   Electronically Signed   By: Alcide Clever M.D.   On: 01/05/2013 11:30     CXR: vol loss, vasc congestion as of 12/31  ASSESSMENT / PLAN:  PULMONARY A:Bibasilar infiltrate c/w HCAP (snf pt ) Influenza  P:   Oxygen titration See ID section Diuresis for neg volume goals   CARDIOVASCULAR A: septic shock (resolved) mult sources:  Diarrhea, stone in L ureter with hydronephrosis/pyelo, PNA, and PMC P:  Hemodynamically improved with volume and abx, agree with some gentle diuresis as BP and renal status can tolerate   RENAL A:  AKI on CKD d/t sepsis and renal stone and hydronephrosis >slowly improving  >J-J stent placed 12/31, P:   Follow UOP and S Cr post-stent placement on L  GASTROINTESTINAL A:  Diarrhea, C diff positive P:   Advance diet slowly  HEMATOLOGIC A:  leukocytosis P:  Follow CBC with appropriate abx treatment+  INFECTIOUS A:   Septic shock UT source w/ Ecoli,  In setting of obstructive uropathy, C diff colitis, Influenza, possible HCAP GNR bacteremia (likely E coli) P:   Agree with abx as orderd see dashboard Follow blood cx speciation  ENDOCRINE A:  DM2 with renal dz   P:   SSI   NEUROLOGIC A:  AMS d/t sepsis P:   Monitor  Note pt is DNI DNR  TODAY'S SUMMARY:  Looks good. Debilitated at baseline. Stable for floor transfer from PCCM stand-point. We will sign off.   I have personally obtained a history, examined the patient, evaluated laboratory and imaging results, formulated the assessment and plan and placed orders.  Alyson Reedy, M.D. [facility] Pulmonary/Critical Care Medicine. Pager: [phone]. After hours pager: [phone].

## 2013-01-08 DIAGNOSIS — R1319 Other dysphagia: Secondary | ICD-10-CM | POA: Diagnosis present

## 2013-01-08 LAB — COMPREHENSIVE METABOLIC PANEL
ALK PHOS: 85 U/L (ref 39–117)
ALT: 33 U/L (ref 0–53)
AST: 27 U/L (ref 0–37)
Albumin: 2.1 g/dL — ABNORMAL LOW (ref 3.5–5.2)
BUN: 55 mg/dL — ABNORMAL HIGH (ref 6–23)
CO2: 22 meq/L (ref 19–32)
Calcium: 8.5 mg/dL (ref 8.4–10.5)
Chloride: 106 mEq/L (ref 96–112)
Creatinine, Ser: 2.13 mg/dL — ABNORMAL HIGH (ref 0.50–1.35)
GFR calc Af Amer: 34 mL/min — ABNORMAL LOW (ref >90)
GFR, EST NON AFRICAN AMERICAN: 29 mL/min — AB (ref >90)
GLUCOSE: 178 mg/dL — AB (ref 70–99)
POTASSIUM: 3.6 meq/L — AB (ref 3.7–5.3)
SODIUM: 141 meq/L (ref 137–147)
TOTAL PROTEIN: 5.8 g/dL — AB (ref 6.0–8.3)
Total Bilirubin: 0.4 mg/dL (ref 0.3–1.2)

## 2013-01-08 LAB — CULTURE, BLOOD (ROUTINE X 2)

## 2013-01-08 LAB — CBC
HCT: 29.2 % — ABNORMAL LOW (ref 39.0–52.0)
Hemoglobin: 9.4 g/dL — ABNORMAL LOW (ref 13.0–17.0)
MCH: 28.3 pg (ref 26.0–34.0)
MCHC: 32.2 g/dL (ref 30.0–36.0)
MCV: 88 fL (ref 78.0–100.0)
Platelets: 103 10*3/uL — ABNORMAL LOW (ref 150–400)
RBC: 3.32 MIL/uL — ABNORMAL LOW (ref 4.22–5.81)
RDW: 16 % — ABNORMAL HIGH (ref 11.5–15.5)
WBC: 12.7 10*3/uL — ABNORMAL HIGH (ref 4.0–10.5)

## 2013-01-08 LAB — GLUCOSE, CAPILLARY
GLUCOSE-CAPILLARY: 201 mg/dL — AB (ref 70–99)
Glucose-Capillary: 166 mg/dL — ABNORMAL HIGH (ref 70–99)
Glucose-Capillary: 207 mg/dL — ABNORMAL HIGH (ref 70–99)
Glucose-Capillary: 140 mg/dL — ABNORMAL HIGH (ref 70–99)

## 2013-01-08 MED ORDER — SODIUM CHLORIDE 0.9 % IJ SOLN
10.0000 mL | INTRAMUSCULAR | Status: DC | PRN
  Administered 2013-01-08: 20 mL
  Administered 2013-01-09: 10 mL
  Administered 2013-01-09: 20 mL
  Administered 2013-01-10: 10 mL
  Administered 2013-01-10: 20 mL
  Administered 2013-01-11 – 2013-01-12 (×6): 10 mL
  Administered 2013-01-12 – 2013-01-13 (×2): 20 mL

## 2013-01-08 MED ORDER — INSULIN ASPART 100 UNIT/ML ~~LOC~~ SOLN
0.0000 [IU] | Freq: Three times a day (TID) | SUBCUTANEOUS | Status: DC
  Administered 2013-01-08: 3 [IU] via SUBCUTANEOUS
  Administered 2013-01-09: 1 [IU] via SUBCUTANEOUS
  Administered 2013-01-09 – 2013-01-10 (×4): 2 [IU] via SUBCUTANEOUS
  Administered 2013-01-10: 3 [IU] via SUBCUTANEOUS
  Administered 2013-01-11: 1 [IU] via SUBCUTANEOUS
  Administered 2013-01-11 – 2013-01-12 (×4): 2 [IU] via SUBCUTANEOUS
  Administered 2013-01-12: 5 [IU] via SUBCUTANEOUS
  Administered 2013-01-13: 1 [IU] via SUBCUTANEOUS
  Administered 2013-01-13: 3 [IU] via SUBCUTANEOUS

## 2013-01-08 MED ORDER — INSULIN ASPART 100 UNIT/ML ~~LOC~~ SOLN: 0.0000 [IU] | Freq: Every day | SUBCUTANEOUS | Status: DC

## 2013-01-08 MED ORDER — APIXABAN 5 MG PO TABS
5.0000 mg | ORAL_TABLET | Freq: Two times a day (BID) | ORAL | Status: DC
  Administered 2013-01-08 – 2013-01-13 (×10): 5 mg via ORAL
  Filled 2013-01-08 (×11): qty 1

## 2013-01-08 MED ORDER — OSELTAMIVIR PHOSPHATE 75 MG PO CAPS
75.0000 mg | ORAL_CAPSULE | Freq: Every day | ORAL | Status: AC
  Administered 2013-01-09: 75 mg via ORAL
  Filled 2013-01-08: qty 1

## 2013-01-08 NOTE — Progress Notes (Signed)
ANTIBIOTIC CONSULT NOTE - FOLLOW UP  Pharmacy Consult for levofloxacin/aztreonam/tamilfu Indication: pneumonia/flu/UTI with e. Coli bacteremia  Assessment: 81 YOM presents with septic shock due to influenza, pneumonia, C. diffiicle and e. Coli bacteremia d/t obstructive pyelonephritis (purulent material seen during placement of double-J stent by urology)  12/31 >> Vancomycin IV x 1 12/31 >> Aztreonam >> 12/31 >> vancomycin PO >>  12/31 >> metronidazole >> 12/31 >> tamiflu >> 1/1 >> levofloxacin >>  Tmax: 99.9 WBCs: 12.7 (improving) Renal: SCr 2.13 (improved) for CrCl = 67ml/min (C-G)  12/31 bloodx2: E. Coli (R to amp, amp/sulb, cefazolin, cipro, pip/tazo) 12/31 urine: E.coli (R to amp, cefazolin, pip/tazo, fluoroquinolones) 12/31 Cdiff: positive / sputum: not collected 12/31 Influenza PCR: Positive influenza A.  Goal of Therapy:  appropriately dose antibiotic per renal function and indication  Plan:  Day #4 aztreonam/tamiflu/levofloxacin/PO vanco/IV flagyl  Continue aztreonam 1gm IV q8h  Microbiology to drop E-test to check aztreonam susceptibility to e. Coli  Unable to clarify antibiotic allergies, patient was poor historian and NH only has allergy list without reactions  Patient improving clinically per d/w [facility]  D/C protonix d/t c/ difficile infection    Allergies  Allergen Reactions  . Carbapenems Other (See Comments)    Per nursing home [facility]  . Cephalosporins Other (See Comments)    Per nursing home [facility]  . Erythromycin Other (See Comments)    Per nursing home [facility]  . Penicillins Other (See Comments)    Per nursing home [facility]  . Sulfa Antibiotics Other (See Comments)    Per nursing home [facility]    Patient Measurements: Height: 6\' 1"  (185.4 cm) Weight: 224 lb 13.9 oz (102 kg) IBW/kg (Calculated) : 79.9 Adjusted Body Weight:   Vital Signs: Temp: 99 F (37.2 C) (01/03 0530) Temp src: Axillary (01/03 0530) BP: 137/76 mmHg (01/03 0530) Pulse Rate: 82 (01/03  0530) Intake/Output from previous day: 01/02 0701 - 01/03 0700 In: 2600 [P.O.:1950; IV Piggyback:450] Out: 3950 [Urine:3950] Intake/Output from this shift: Total I/O In: 360 [P.O.:360] Out: 800 [Urine:800]  Labs:  Recent Labs  01/06/13 0430 01/06/13 1735 01/07/13 0430 01/08/13 0700  WBC 22.1* 20.9* 21.1* 12.7*  HGB 9.6* 10.1* 9.9* 9.4*  PLT 88* 114* 116* 103*  CREATININE 2.81*  --  2.65* 2.13*   Estimated Creatinine Clearance: 38.8 ml/min (by C-G formula based on Cr of 2.13).  Recent Labs  01/07/13 0430  VANCORANDOM 11.3     Microbiology: Recent Results (from the past 720 hour(s))  CLOSTRIDIUM DIFFICILE BY PCR     Status: Abnormal   Collection Time    01/05/13  5:25 AM      Result Value Range Status   C difficile by pcr POSITIVE (*) NEGATIVE Final   Comment: CRITICAL RESULT CALLED TO, READ BACK BY AND VERIFIED WITH:     SHEPHERD RN 9:35 01/05/13 ([email or web address])     Performed at [facility]  MRSA PCR SCREENING     Status: None   Collection Time    01/05/13  9:06 AM      Result Value Range Status   MRSA by PCR NEGATIVE  NEGATIVE Final   Comment:            The GeneXpert MRSA Assay (FDA     approved for NASAL specimens     only), is one component of a     comprehensive MRSA colonization     surveillance program. It is not     intended to diagnose MRSA  infection nor to guide or     monitor treatment for     MRSA infections.  CULTURE, BLOOD (ROUTINE X 2)     Status: None   Collection Time    01/05/13  9:30 AM      Result Value Range Status   Specimen Description BLOOD LEFT ARM   Final   Special Requests BOTTLES DRAWN AEROBIC AND ANAEROBIC 10CC   Final   Culture  Setup Time     Final   Value: 01/05/2013 14:04     Performed at [facility]   Culture     Final   Value: ESCHERICHIA COLI     Note: Gram Stain Report Called to,Read Back By and Verified With: Lezlie LyeLISA FREI [id number] BRMEL     Performed at [facility]   Report Status  01/08/2013 FINAL   Final   Organism ID, Bacteria ESCHERICHIA COLI   Final  CULTURE, BLOOD (ROUTINE X 2)     Status: None   Collection Time    01/05/13  9:35 AM      Result Value Range Status   Specimen Description BLOOD LEFT HAND   Final   Special Requests BOTTLES DRAWN AEROBIC ONLY 5CC   Final   Culture  Setup Time     Final   Value: 01/05/2013 14:04     Performed at [facility]   Culture     Final   Value:        BLOOD CULTURE RECEIVED NO GROWTH TO DATE CULTURE WILL BE HELD FOR 5 DAYS BEFORE ISSUING A FINAL NEGATIVE REPORT     Performed at [facility]   Report Status PENDING   Incomplete  URINE CULTURE     Status: None   Collection Time    01/05/13 10:27 AM      Result Value Range Status   Specimen Description URINE, CATHETERIZED   Final   Special Requests NONE   Final   Culture  Setup Time     Final   Value: 01/05/2013 14:03     Performed at [facility]   Colony Count     Final   Value: >=100,000 COLONIES/ML     Performed at [facility]   Culture     Final   Value: ESCHERICHIA COLI     Performed at [facility]   Report Status PENDING   Incomplete   Organism ID, Bacteria ESCHERICHIA COLI   Final    Anti-infectives   Start     Dose/Rate Route Frequency Ordered Stop   01/09/13 1000  oseltamivir (TAMIFLU) capsule 75 mg     75 mg Oral Daily 01/08/13 1349 01/10/13 0959   01/08/13 1000  oseltamivir (TAMIFLU) capsule 75 mg  Status:  Discontinued     75 mg Oral Daily 01/07/13 0928 01/08/13 1349   01/06/13 1200  levofloxacin (LEVAQUIN) IVPB 750 mg  Status:  Discontinued     750 mg 100 mL/hr over 90 Minutes Intravenous Every 24 hours 01/06/13 1045 01/06/13 1102   01/06/13 1200  levofloxacin (LEVAQUIN) IVPB 750 mg     750 mg 100 mL/hr over 90 Minutes Intravenous Every 48 hours 01/06/13 1102     01/06/13 1000  oseltamivir (TAMIFLU) capsule 30 mg  Status:  Discontinued     30 mg Oral Daily 01/05/13 0901 01/07/13 0928   01/05/13 2200   metroNIDAZOLE (FLAGYL) IVPB 500 mg     500 mg 100 mL/hr over 60  Minutes Intravenous Every 8 hours 01/05/13 1511     01/05/13 1800  aztreonam (AZACTAM) 1 g in dextrose 5 % 50 mL IVPB     1 g 100 mL/hr over 30 Minutes Intravenous Every 8 hours 01/05/13 0856 01/13/13 1759   01/05/13 1800  vancomycin (VANCOCIN) 500 mg in sodium chloride irrigation 0.9 % 100 mL ENEMA  Status:  Discontinued     500 mg Rectal 4 times per day 01/05/13 1511 01/05/13 1717   01/05/13 1200  vancomycin (VANCOCIN) 50 mg/mL oral solution 500 mg     500 mg Oral 4 times per day 01/05/13 1041 01/19/13 1159   01/05/13 1200  metroNIDAZOLE (FLAGYL) IVPB 500 mg  Status:  Discontinued     500 mg 100 mL/hr over 60 Minutes Intravenous Every 8 hours 01/05/13 1041 01/05/13 1510   01/05/13 1000  vancomycin (VANCOCIN) 2,000 mg in sodium chloride 0.9 % 500 mL IVPB     2,000 mg 250 mL/hr over 120 Minutes Intravenous  Once 01/05/13 0901 01/05/13 1200   01/05/13 0830  aztreonam (AZACTAM) 2 g in dextrose 5 % 50 mL IVPB     2 g 100 mL/hr over 30 Minutes Intravenous STAT 01/05/13 0821 01/05/13 0950   01/05/13 0600  levofloxacin (LEVAQUIN) IVPB 750 mg     750 mg 100 mL/hr over 90 Minutes Intravenous  Once 01/05/13 0545 01/05/13 0923   01/05/13 0330  oseltamivir (TAMIFLU) capsule 75 mg  Status:  Discontinued     75 mg Oral 2 times daily 01/05/13 0317 01/05/13 0901        Juliette Alcide, PharmD, BCPS.   Pager: [phone] 01/08/2013,2:52 PM

## 2013-01-08 NOTE — Progress Notes (Addendum)
[facility] PROGRESS NOTE  Henry Wyatt WUJ:[id number] DOB: 1940-09-16 DOA: 01/05/2013 PCP: Ginette Otto, MD  Interim summary:  Patient with history of dementia, diabetes and DVT presented on 12/31 with shortness of breath and diarrhea and found to be in septic shock and placed in ICU. Initially, felt to be secondary to flu which was PCR positive, so patient was started on Tamiflu and since finished a five-day course as of 1/4.  Patient was also felt to have infectious causes for sepsis which presented as well. Patient was found to have hydronephrosis secondary to obstructing stone. Urology was consulted who took patient to OR and placed JJ stent for drainage. Blood cultures and urine grew out Escherichia coli, resistant to fluoroquinolones. Because of patient's reported allergies to penicillin/cephalosporins and sulfa, patient has been on Azactam.   Patient also found to be C. difficile positive and given sepsis, started on by mouth vancomycin/IV Flagyl therapy. Patient also found to have pneumonia, treated with Levaquin. A high suspicion for aspiration given history of dementia.   Patient also suspected in respiratory distress secondary to volume overload. He was started on aggressive diuresis with IV Lasix. Echocardiogram noted grade 2 diastolic dysfunction and ejection fraction of 35%. Patient to date has diuresed all additional volume initially required to resuscitate him plus an additional 2.5 L. Patient came in in acute on chronic renal failure, but with Lasix, his renal function continues to improve.   Once he was more alert, evaluated by speech therapy on 1/2 and patient downgraded to dysphagia 3 diet. In regards to his DVT, once he was transferred out of the ICU on 1/2, and stable, eliquis restarted.  Assessment/Plan: Principal Problem:   Septic shock(785.52): Blood cultures preliminary growing out gram-negative rods, presumably from urine which is growing out Cipro  resistant Escherichia coli.  According to difficult is that the patient has reported allergies to penicillins, cephalosporins and sulfa. Daily we need to test his cephalosporin allergy and/or get more detailed records from his nursing facility or primary care physician versus placing PICC line and keeping him on IV antibiotics for the next few weeks. Will ask pharmacy for assistance and if no avail, will call  PCP Dr. Pete Glatter on Monday.  Active Problems: DVT--history of: restart eliquis now the patient is stable    Diarrhea: Secondary C. difficile   HCAP (healthcare-associated pneumonia) versus aspiration: Questionable. Respiratory failure may be more secondary to volume overload and influenza. Dilated to dysphagia 3 diet with thin liquids. On Levaquin until 1/6    Renal stone: Status post renal extraction., On IV antibiotics. On Azactam, will need to continue this antibiotic coverage until 1/13, then daily oral suppressive therapy. Patient will need to follow up with urology in 2-3 weeks upon discharge    Hydronephrosis of left kidney: Secondary to renal stone obstruction, resolved    Altered mental status in the setting of suspected mild underlying dementia. This is metabolic encephalopathy from sepsis from multiple sources of infection. He is better, however still has    DM (diabetes mellitus), type 2 with renal complications, sugar stable: Patient is on sliding scale at his nursing facility. We'll check A1 C. and if elevated, would start Lantus for him to be discharged on     Acute renal failure in the setting of CKD (chronic kidney disease): Unclear baseline, however slowly continues to improve.    Acute systolic/diastolic congestive heart failure: BNP elevated and responding to gentle diuresis. Awaiting echocardiogram, has diuresed approximately the positive 1.5  L he  was initially resuscitated with plus an additional  2.5  mL. Echocardiogram notes significantly decreased ejection fraction  35% plus grade 2 diastolic dysfunction. Recheck BNP in the morning     Influenza A: On Tamiflu-course finished on 1/4  Code Status: DO NOT RESUSCITATE Family Communication:  None present, left message with family several days ago   Disposition Plan:  Discharge her once antibiotic plan in place, is fully diuresed    Consultants:  Critical care-signed off  Urology  Procedures:  Echocardiogram: Grade 2 diastolic dysfunction plus systolic dysfunction with EF of 35% J-J stent placed 12/31, was on norepi for several hours post-op but now weaned to off.    Antibiotics:  IV Azactam 12/31- 1/13   IV Flagyl 12/31-present  IV Levaquin 12/31- 1/6   By mouth vancomycin 12/31-present  Tamiflu 12/31- 1/4   IV vancomycin 12/31-discontinued  HPI/Subjective: Patient doing okay.  Interactive. Thirsty. Chronic dementia   Objective: Filed Vitals:   01/08/13 0530  BP: 137/76  Pulse: 82  Temp: 99 F (37.2 C)  Resp: 22    Intake/Output Summary (Last 24 hours) at 01/08/13 1403 Last data filed at 01/08/13 1000  Gross per 24 hour  Intake   1680 ml  Output   4050 ml  Net  -2370 ml   Filed Weights   01/05/13 0857 01/06/13 0530 01/07/13 0400  Weight: 102.1 kg (225 lb 1.4 oz) 104 kg (229 lb 4.5 oz) 102 kg (224 lb 13.9 oz)    Exam:   General:  More awake and interactive oriented x1  Cardiovascular: Regular rhythm, occasional PVCs  Respiratory: Decreased breath sounds at the bases  Abdomen: Soft, nontender?, Minimal distention, hypoactive bowel sounds  Musculoskeletal: No clubbing or cyanosis, trace edema bilaterally   Data Reviewed: Basic Metabolic Panel:  Recent Labs Lab 01/05/13 0440 01/05/13 0451 01/05/13 1550 01/06/13 0430 01/07/13 0430 01/08/13 0700  NA 133* 137 135* 134* 139 141  K 3.9 4.0 4.9 5.0 4.0 3.6*  CL 95* 100 100 100 105 106  CO2 21  --  18* 20 20 22   GLUCOSE 156* 156* 206* 174* 97 178*  BUN 49* 47* 54* 57* 63* 55*  CREATININE 2.71* 2.90*  2.87* 2.81* 2.65* 2.13*  CALCIUM 8.7  --  8.0* 8.4 8.5 8.5   Liver Function Tests:  Recent Labs Lab 01/05/13 0440 01/08/13 0700  AST 16 27  ALT 11 33  ALKPHOS 166* 85  BILITOT 0.5 0.4  PROT 6.4 5.8*  ALBUMIN 2.8* 2.1*   CBC:  Recent Labs Lab 01/05/13 1550 01/06/13 0430 01/06/13 1735 01/07/13 0430 01/08/13 0700  WBC 21.0* 22.1* 20.9* 21.1* 12.7*  NEUTROABS  --  20.5*  --   --   --   HGB 9.7* 9.6* 10.1* 9.9* 9.4*  HCT 30.1* 29.2* 30.0* 28.9* 29.2*  MCV 89.1 88.0 87.2 86.8 88.0  PLT 100* 88* 114* 116* 103*   Cardiac Enzymes: No results found for this basename: CKTOTAL, CKMB, CKMBINDEX, TROPONINI,  in the last 168 hours BNP (last 3 results)  Recent Labs  01/05/13 1600  PROBNP 8741.0*   CBG:  Recent Labs Lab 01/07/13 1136 01/07/13 1650 01/07/13 2238 01/08/13 0745 01/08/13 1201  GLUCAP 114* 152* 122* 166* 207*    Recent Results (from the past 240 hour(s))  CLOSTRIDIUM DIFFICILE BY PCR     Status: Abnormal   Collection Time    01/05/13  5:25 AM      Result Value Range Status   C difficile by pcr POSITIVE (*)  NEGATIVE Final   Comment: CRITICAL RESULT CALLED TO, READ BACK BY AND VERIFIED WITH:     SHEPHERD RN 9:35 01/05/13 ([email or web address])     [facility]  MRSA PCR SCREENING     Status: None   Collection Time    01/05/13  9:06 AM      Result Value Range Status   MRSA by PCR NEGATIVE  NEGATIVE Final   Comment:            The GeneXpert MRSA Assay (FDA     approved for NASAL specimens     only), is one component of a     comprehensive MRSA colonization     surveillance program. It is not     intended to diagnose MRSA     infection nor to guide or     monitor treatment for     MRSA infections.  CULTURE, BLOOD (ROUTINE X 2)     Status: None   Collection Time    01/05/13  9:30 AM      Result Value Range Status   Specimen Description BLOOD LEFT ARM   Final   Special Requests BOTTLES DRAWN AEROBIC AND ANAEROBIC 10CC   Final   Culture   Setup Time     Final   Value: 01/05/2013 14:04     Performed at [facility]   Culture     Final   Value: ESCHERICHIA COLI     Note: Gram Stain Report Called to,Read Back By and Verified With: Lezlie LyeLISA FREI [id number] BRMEL     Performed at [facility]   Report Status 01/08/2013 FINAL   Final   Organism ID, Bacteria ESCHERICHIA COLI   Final  CULTURE, BLOOD (ROUTINE X 2)     Status: None   Collection Time    01/05/13  9:35 AM      Result Value Range Status   Specimen Description BLOOD LEFT HAND   Final   Special Requests BOTTLES DRAWN AEROBIC ONLY 5CC   Final   Culture  Setup Time     Final   Value: 01/05/2013 14:04     Performed at [facility]   Culture     Final   Value:        BLOOD CULTURE RECEIVED NO GROWTH TO DATE CULTURE WILL BE HELD FOR 5 DAYS BEFORE ISSUING A FINAL NEGATIVE REPORT     Performed at [facility]   Report Status PENDING   Incomplete  URINE CULTURE     Status: None   Collection Time    01/05/13 10:27 AM      Result Value Range Status   Specimen Description URINE, CATHETERIZED   Final   Special Requests NONE   Final   Culture  Setup Time     Final   Value: 01/05/2013 14:03     Performed at [facility]   Colony Count     Final   Value: >=100,000 COLONIES/ML     Performed at [facility]   Culture     Final   Value: ESCHERICHIA COLI     Performed at [facility]   Report Status 01/07/2013 FINAL   Final   Organism ID, Bacteria ESCHERICHIA COLI   Final     Studies: No results found.  Scheduled Meds: . antiseptic oral rinse  15 mL Mouth Rinse q12n4p  . apixaban  5 mg Oral BID  . aztreonam  1 g  Intravenous Q8H  . chlorhexidine  15 mL Mouth Rinse BID  . feeding supplement (RESOURCE BREEZE)  1 Container Oral BID BM  . furosemide  20 mg Intravenous Q12H  . insulin aspart  0-5 Units Subcutaneous QHS  . insulin aspart  0-9 Units Subcutaneous TID WC  . levofloxacin (LEVAQUIN) IV  750 mg Intravenous  Q48H  . metronidazole  500 mg Intravenous Q8H  . [START ON 01/09/2013] oseltamivir  75 mg Oral Daily  . pantoprazole  40 mg Oral Q1200  . vancomycin  500 mg Oral Q6H   Continuous Infusions:    Principal Problem:   Septic shock(785.52) Active Problems:   Severe sepsis(995.92)   Diarrhea   HCAP (healthcare-associated pneumonia)   Renal stone   Hydronephrosis of left kidney   Altered mental status   DVT (deep venous thrombosis)   DM (diabetes mellitus), type 2 with renal complications   AKI (acute kidney injury)   CKD (chronic kidney disease)   C. difficile diarrhea   Influenza A    Time spent: 35 minutes    Hollice Espy  [facility] Pager [phone]. If 7PM-7AM, please contact night-coverage at [email or web address], password [facility] 01/08/2013, 2:03 PM  LOS: 3 days

## 2013-01-08 NOTE — Evaluation (Signed)
Occupational Therapy Evaluation Patient Details Name: Henry Wyatt MRN: [id number] DOB: 11-05-1940 Today's Date: 01/08/2013 Time: 4098-1191 OT Time Calculation (min): 16 min  OT Assessment / Plan / Recommendation History of present illness Henry Wyatt  is a 73 y.o. male, nursing home resident, with history of R shoulder replacement, back surgery 3 yrs ago with progressive decline per wife,  hypertension and diabetes, sent from the nursing home for evaluation for fever and change in mental status. fluA positive, C diff positive, L obstructive pyelonephritis, s/p stent 12/31.   Clinical Impression   Pt requires extensive assist with ADLs and is at baseline level of function. Pt resides at a SNF where he receives min A with UB ADLs/total A with LB ADLs, is non ambulatory and uses a w/c. No further acute OT services indicated at this time and any further OT needs can be met at SNF level of care    OT Assessment  All further OT needs can be met in the next venue of care    Follow Up Recommendations  SNF;Supervision/Assistance - 24 hour    Barriers to Discharge  none    Equipment Recommendations  None recommended by OT    Recommendations for Other Services    Frequency       Precautions / Restrictions Precautions Precautions: Fall Restrictions Weight Bearing Restrictions: No   Pertinent Vitals/Pain No c/o pain    ADL  Grooming: Performed;Wash/dry hands;Wash/dry face;Brushing hair;Supervision/safety;Set up;Min guard Where Assessed - Grooming: Supported sitting Upper Body Bathing: Simulated;Supervision/safety;Set up;Minimal assistance Where Assessed - Upper Body Bathing: Supported sitting Lower Body Bathing: +1 Total assistance Upper Body Dressing: Performed;Supervision/safety;Set up;Minimal assistance Where Assessed - Upper Body Dressing: Supported sitting Lower Body Dressing: +1 Total assistance Transfers/Ambulation Related to ADLs: transfers not assessed due to pt  requires 2 person assist  ADL Comments: Pt is at baseline for UB ADLs and continues to require total A for LB ADLs    OT Diagnosis: Generalized weakness  OT Problem List: Decreased strength;Decreased activity tolerance;Impaired balance (sitting and/or standing) OT Treatment Interventions:     OT Goals(Current goals can be found in the care plan section)    Visit Information  Last OT Received On: 01/08/13 Assistance Needed: +2 History of Present Illness: Henry Wyatt  is a 73 y.o. male, nursing home resident, with history of R shoulder replacement, back surgery 3 yrs ago with progressive decline per wife,  hypertension and diabetes, sent from the nursing home for evaluation for fever and change in mental status. fluA positive, C diff positive, L obstructive pyelonephritis, s/p stent 12/31.       Prior [place] expects to be discharged to:: Skilled nursing facility Prior Function Level of Independence: Needs assistance Gait / Transfers Assistance Needed: Hasn't walked since July per wife. She stated he had back surgery 3 yrs ago and had decline with walking since then.  Was able to stand 5 minutes in PT at SNF prior to admission.  ADL's / Homemaking Assistance Needed: Pt states that he is in a w/c and that SNF staff proivdes total A for LB ADLs. Pt states that he is able to feed himself and complete UB ADLs and grooming tasks with sup -  min A after set up Communication Communication: HOH Dominant Hand: Right         Vision/Perception Vision - History Baseline Vision: Wears glasses all the time Patient Visual Report: No change from baseline Perception Perception: Within Functional Limits  Cognition  Cognition Arousal/Alertness: Awake/alert Behavior During Therapy: WFL for tasks assessed/performed Overall Cognitive Status: History of cognitive impairments - at baseline    Extremity/Trunk Assessment Upper Extremity Assessment Upper  Extremity Assessment: Overall WFL for tasks assessed;Generalized weakness RUE Deficits / Details: h/o R shoulder replacement, shoulder elevation limited to approx 40* AAROM Lower Extremity Assessment Lower Extremity Assessment: Defer to PT evaluation Cervical / Trunk Assessment Cervical / Trunk Assessment: Kyphotic     Mobility Bed Mobility Bed Mobility: Supine to Sit;Sit to Supine Supine to Sit: HOB elevated;2: Max assist Sit to Supine: 1: +1 Total assist Details for Bed Mobility Assistance: assist to advance BLEs to EOB and to raise trunk for supine to sit; +2 to return to supine -required assist to bring LEs into bed and to control descent of trunk Transfers Transfers: Not assessed Details for Transfer Assistance: Pt requires 2 person assist for sit - stand, transfers     Exercise     Balance Balance Balance Assessed: Yes Dynamic Sitting Balance Dynamic Sitting - Balance Support: Left upper extremity supported;Right upper extremity supported;Feet unsupported;During functional activity Dynamic Sitting - Level of Assistance: 4: Min assist   End of Session OT - End of Session Activity Tolerance: Patient limited by fatigue Patient left: in bed;with call bell/phone within reach  GO     Britt Bottom 01/08/2013, 1:02 PM

## 2013-01-09 LAB — BASIC METABOLIC PANEL
BUN: 47 mg/dL — ABNORMAL HIGH (ref 6–23)
CHLORIDE: 104 meq/L (ref 96–112)
CO2: 22 mEq/L (ref 19–32)
CREATININE: 1.77 mg/dL — AB (ref 0.50–1.35)
Calcium: 8.6 mg/dL (ref 8.4–10.5)
GFR calc Af Amer: 42 mL/min — ABNORMAL LOW (ref >90)
GFR calc non Af Amer: 36 mL/min — ABNORMAL LOW (ref >90)
Glucose, Bld: 152 mg/dL — ABNORMAL HIGH (ref 70–99)
POTASSIUM: 3.6 meq/L — AB (ref 3.7–5.3)
Sodium: 139 mEq/L (ref 137–147)

## 2013-01-09 LAB — CBC
HCT: 30.3 % — ABNORMAL LOW (ref 39.0–52.0)
HEMOGLOBIN: 9.9 g/dL — AB (ref 13.0–17.0)
MCH: 28.5 pg (ref 26.0–34.0)
MCHC: 32.7 g/dL (ref 30.0–36.0)
MCV: 87.3 fL (ref 78.0–100.0)
Platelets: 151 10*3/uL (ref 150–400)
RBC: 3.47 MIL/uL — AB (ref 4.22–5.81)
RDW: 15.9 % — ABNORMAL HIGH (ref 11.5–15.5)
WBC: 10.5 10*3/uL (ref 4.0–10.5)

## 2013-01-09 LAB — GLUCOSE, CAPILLARY
GLUCOSE-CAPILLARY: 128 mg/dL — AB (ref 70–99)
Glucose-Capillary: 158 mg/dL — ABNORMAL HIGH (ref 70–99)
Glucose-Capillary: 197 mg/dL — ABNORMAL HIGH (ref 70–99)
Glucose-Capillary: 171 mg/dL — ABNORMAL HIGH (ref 70–99)

## 2013-01-09 LAB — HEMOGLOBIN A1C
HEMOGLOBIN A1C: 7.4 % — AB (ref <5.7)
Mean Plasma Glucose: 166 mg/dL — ABNORMAL HIGH (ref <117)

## 2013-01-09 LAB — URINE CULTURE: Colony Count: >=100000

## 2013-01-09 LAB — PRO B NATRIURETIC PEPTIDE: PRO B NATRI PEPTIDE: 6577 pg/mL — AB (ref 0–125)

## 2013-01-09 MED ORDER — OXYCODONE HCL 5 MG PO TABS
5.0000 mg | ORAL_TABLET | Freq: Four times a day (QID) | ORAL | Status: DC | PRN
  Administered 2013-01-09 – 2013-01-13 (×8): 5 mg via ORAL
  Filled 2013-01-09 (×8): qty 1

## 2013-01-09 NOTE — Progress Notes (Signed)
Clinical Social Work Department BRIEF PSYCHOSOCIAL ASSESSMENT 01/09/2013  Patient:  Gwyneth SproutLAVELLE,Crescencio     Account Number:  [id number]     Admit date:  01/05/2013  Clinical Social Worker:  Doroteo GlassmanSIMPSON,AMANDA, LCSWA  Date/Time:  01/09/2013 12:11 PM  Referred by:  Physician  Date Referred:  01/09/2013 Referred for  SNF Placement   Other Referral:   Interview type:  Patient Other interview type:   Pt's wife via phone    PSYCHOSOCIAL DATA Living Status:  FACILITY [facility] Level of care:  Assisted Living Primary support name:  Mrs. Grant RutsLavelle Primary support relationship to patient:  SPOUSE Degree of support available:   strong    CURRENT CONCERNS Current Concerns  Post-Acute Placement   Other Concerns:    SOCIAL WORK ASSESSMENT / PLAN Spoke with Pt's wife via phone, as RN stated that Pt has trouble participating in meaningful conversation.    Pt's wife confirmed that he is from the assisted living [facility] and she stated that she understands that he will go to the rehab side upon d/c.    CSW thanked Pt's wife for her time.    [facility] Pt will go to the skilled side of the facility upon d/c.   Assessment/plan status:  Psychosocial Support/Ongoing Assessment of Needs Other assessment/ plan:   Information/referral to community resources:   n/a-Pt from [facility]    PATIENT'S/FAMILY'S RESPONSE TO PLAN OF CARE: Pt's wife was calm, cooperative and pleasant and was fine with [facility].    Pt's wife thanked CSW for time and assistance.   Providence CrosbyAmanda Alejo Beamer, LCSWA Clinical Social Work [phone]

## 2013-01-09 NOTE — Progress Notes (Signed)
Dr. Cena BentonVega aware of IV team concerns of discontinuing IJ line. Pt difficult stick. See new orders received via phone from MD.

## 2013-01-09 NOTE — Progress Notes (Signed)
[facility] PROGRESS NOTE  Henry Wyatt ZOX:[id number] DOB: 03-23-1940 DOA: 01/05/2013 PCP: Ginette Otto, MD  Assessment/Plan: Principal Problem:   Septic shock(785.52) - Resolving on current antibiotic regimen and initial Tamiflu  Active Problems:   Severe sepsis(995.92) - Resolving on current antibiotic regimen    Diarrhea -  Most likely due to C. diffic. Improving on oral vancomycinile    HCAP (healthcare-associated pneumonia) - Suspect it initially but most likely not cause of respiratory distress. Chest x-ray is not convincing for infiltrates or consolidations and patient had elevated BNP. We'll discontinue Levaquin    Renal stone - Urology managing    Hydronephrosis of left kidney - Patient is status post JJ stent placement. Urology on board    Altered mental status    DM (diabetes mellitus), type 2 with renal complications - Continue sliding scale insulin at this point, advance diet to diabetic diet once tolerated    AKI (acute kidney injury) - As listed below serum creatinine continues to improve    CKD (chronic kidney disease) - Currently improving with improved by mouth intake and after JJ stent placement described below and the neurology note    C. difficile diarrhea - Plan will be to continue oral vancomycin. Currently stable    Influenza A - Initial problem which was treated with Tamiflu and patient finished course 01/07/2013    Acute pyelonephritis - Patient is status post JJ stent placement. Urine culture growing Escherichia coli the patient is currently on aztreonam Flagyl and vancomycin. Escherichia coli resistant to Levaquin as such Levaquin discontinued    Acute combined systolic and diastolic congestive heart failure - While in house was diuresed with Lasix and currently in respiratory condition much improved per patient. Most likely cause of shortness of breath    Other dysphagia - Currently tolerating full liquid diet  Code  Status: DNR Family Communication: No family at bedside to discuss results. Discussed directly with patient.  Disposition Plan: We'll continue current IV antibiotic regimen, I will need to discuss specific allergy related to cephalosporin to the side which antibiotic regimen to place patient on for discharge    Consultants:  Urology   Procedures:  As mentioned above   Antibiotics:  As mentioned above   HPI/Subjective: No new complaints. Patient feels slightly better. No new problems reported   Objective: Filed Vitals:   01/09/13 1045  BP: 157/89  Pulse: 77  Temp: 98.6 F (37 C)  Resp: 18    Intake/Output Summary (Last 24 hours) at 01/09/13 1214 Last data filed at 01/09/13 1045  Gross per 24 hour  Intake   1290 ml  Output   2800 ml  Net  -1510 ml   Filed Weights   01/05/13 0857 01/06/13 0530 01/07/13 0400  Weight: 102.1 kg (225 lb 1.4 oz) 104 kg (229 lb 4.5 oz) 102 kg (224 lb 13.9 oz)    Exam:   General:  Pt in NAD, Alert and awake  Cardiovascular: RRR, no MRG  Respiratory: no wheezes, no rhales  Abdomen: soft, NT, ND  Musculoskeletal: no cyanosis or clubbing   Data Reviewed: Basic Metabolic Panel:  Recent Labs Lab 01/05/13 1550 01/06/13 0430 01/07/13 0430 01/08/13 0700 01/09/13 0503  NA 135* 134* 139 141 139  K 4.9 5.0 4.0 3.6* 3.6*  CL 100 100 105 106 104  CO2 18* 20 20 22 22   GLUCOSE 206* 174* 97 178* 152*  BUN 54* 57* 63* 55* 47*  CREATININE 2.87* 2.81* 2.65* 2.13* 1.77*  CALCIUM 8.0*  8.4 8.5 8.5 8.6   Liver Function Tests:  Recent Labs Lab 01/05/13 0440 01/08/13 0700  AST 16 27  ALT 11 33  ALKPHOS 166* 85  BILITOT 0.5 0.4  PROT 6.4 5.8*  ALBUMIN 2.8* 2.1*   No results found for this basename: LIPASE, AMYLASE,  in the last 168 hours No results found for this basename: AMMONIA,  in the last 168 hours CBC:  Recent Labs Lab 01/05/13 1550 01/06/13 0430 01/06/13 1735 01/07/13 0430 01/08/13 0700 01/09/13 0503  WBC 21.0*  22.1* 20.9* 21.1* 12.7* 10.5  NEUTROABS  --  20.5*  --   --   --   --   HGB 9.7* 9.6* 10.1* 9.9* 9.4* 9.9*  HCT 30.1* 29.2* 30.0* 28.9* 29.2* 30.3*  MCV 89.1 88.0 87.2 86.8 88.0 87.3  PLT 100* 88* 114* 116* 103* 151   Cardiac Enzymes: No results found for this basename: CKTOTAL, CKMB, CKMBINDEX, TROPONINI,  in the last 168 hours BNP (last 3 results)  Recent Labs  01/05/13 1600 01/09/13 0503  PROBNP 8741.0* 6577.0*   CBG:  Recent Labs Lab 01/08/13 0745 01/08/13 1201 01/08/13 1602 01/08/13 2119 01/09/13 0754  GLUCAP 166* 207* 201* 140* 128*    Recent Results (from the past 240 hour(s))  CLOSTRIDIUM DIFFICILE BY PCR     Status: Abnormal   Collection Time    01/05/13  5:25 AM      Result Value Range Status   C difficile by pcr POSITIVE (*) NEGATIVE Final   Comment: CRITICAL RESULT CALLED TO, READ BACK BY AND VERIFIED WITH:     SHEPHERD RN 9:35 01/05/13 ([email or web address])     Performed at [facility]  MRSA PCR SCREENING     Status: None   Collection Time    01/05/13  9:06 AM      Result Value Range Status   MRSA by PCR NEGATIVE  NEGATIVE Final   Comment:            The GeneXpert MRSA Assay (FDA     approved for NASAL specimens     only), is one component of a     comprehensive MRSA colonization     surveillance program. It is not     intended to diagnose MRSA     infection nor to guide or     monitor treatment for     MRSA infections.  CULTURE, BLOOD (ROUTINE X 2)     Status: None   Collection Time    01/05/13  9:30 AM      Result Value Range Status   Specimen Description BLOOD LEFT ARM   Final   Special Requests BOTTLES DRAWN AEROBIC AND ANAEROBIC 10CC   Final   Culture  Setup Time     Final   Value: 01/05/2013 14:04     Performed at [facility]   Culture     Final   Value: ESCHERICHIA COLI     Note: Gram Stain Report Called to,Read Back By and Verified With: Lezlie LyeLISA FREI [id number] BRMEL     Performed at [facility]   Report Status  01/08/2013 FINAL   Final   Organism ID, Bacteria ESCHERICHIA COLI   Final  CULTURE, BLOOD (ROUTINE X 2)     Status: None   Collection Time    01/05/13  9:35 AM      Result Value Range Status   Specimen Description BLOOD LEFT HAND   Final   Special Requests BOTTLES DRAWN  AEROBIC ONLY 5CC   Final   Culture  Setup Time     Final   Value: 01/05/2013 14:04     Performed at [facility]   Culture     Final   Value:        BLOOD CULTURE RECEIVED NO GROWTH TO DATE CULTURE WILL BE HELD FOR 5 DAYS BEFORE ISSUING A FINAL NEGATIVE REPORT     Performed at [facility]   Report Status PENDING   Incomplete  URINE CULTURE     Status: None   Collection Time    01/05/13 10:27 AM      Result Value Range Status   Specimen Description URINE, CATHETERIZED   Final   Special Requests NONE   Final   Culture  Setup Time     Final   Value: 01/05/2013 14:03     Performed at [facility] Count     Final   Value: >=100,000 COLONIES/ML     Performed at [facility]   Culture     Final   Value: ESCHERICHIA COLI     Performed at [facility]   Report Status 01/09/2013 FINAL   Final   Organism ID, Bacteria ESCHERICHIA COLI   Final     Studies: No results found.  Scheduled Meds: . antiseptic oral rinse  15 mL Mouth Rinse q12n4p  . apixaban  5 mg Oral BID  . aztreonam  1 g Intravenous Q8H  . chlorhexidine  15 mL Mouth Rinse BID  . feeding supplement (RESOURCE BREEZE)  1 Container Oral BID BM  . furosemide  20 mg Intravenous Q12H  . insulin aspart  0-5 Units Subcutaneous QHS  . insulin aspart  0-9 Units Subcutaneous TID WC  . metronidazole  500 mg Intravenous Q8H  . vancomycin  500 mg Oral Q6H   Continuous Infusions:   Principal Problem:   Septic shock(785.52) Active Problems:   Severe sepsis(995.92)   Diarrhea   HCAP (healthcare-associated pneumonia)   Renal stone   Hydronephrosis of left kidney   Altered mental status   DM (diabetes mellitus),  type 2 with renal complications   AKI (acute kidney injury)   CKD (chronic kidney disease)   C. difficile diarrhea   Influenza A   Acute pyelonephritis   Acute combined systolic and diastolic congestive heart failure   Other dysphagia    Time spent: > 35 minutes    Penny Pia  [facility] Pager [phone]. If 7PM-7AM, please contact night-coverage at [email or web address], password [facility] 01/09/2013, 12:14 PM  LOS: 4 days

## 2013-01-10 DIAGNOSIS — E876 Hypokalemia: Secondary | ICD-10-CM | POA: Diagnosis present

## 2013-01-10 LAB — GLUCOSE, CAPILLARY
Glucose-Capillary: 167 mg/dL — ABNORMAL HIGH (ref 70–99)
Glucose-Capillary: 177 mg/dL — ABNORMAL HIGH (ref 70–99)
Glucose-Capillary: 207 mg/dL — ABNORMAL HIGH (ref 70–99)
Glucose-Capillary: 169 mg/dL — ABNORMAL HIGH (ref 70–99)

## 2013-01-10 LAB — BASIC METABOLIC PANEL
BUN: 37 mg/dL — ABNORMAL HIGH (ref 6–23)
CHLORIDE: 102 meq/L (ref 96–112)
CO2: 23 mEq/L (ref 19–32)
CREATININE: 1.52 mg/dL — AB (ref 0.50–1.35)
Calcium: 8.7 mg/dL (ref 8.4–10.5)
GFR calc non Af Amer: 44 mL/min — ABNORMAL LOW (ref >90)
GFR, EST AFRICAN AMERICAN: 51 mL/min — AB (ref >90)
Glucose, Bld: 201 mg/dL — ABNORMAL HIGH (ref 70–99)
POTASSIUM: 3.4 meq/L — AB (ref 3.7–5.3)
SODIUM: 139 meq/L (ref 137–147)

## 2013-01-10 LAB — CBC
HEMATOCRIT: 31 % — AB (ref 39.0–52.0)
Hemoglobin: 10.4 g/dL — ABNORMAL LOW (ref 13.0–17.0)
MCH: 28.9 pg (ref 26.0–34.0)
MCHC: 33.5 g/dL (ref 30.0–36.0)
MCV: 86.1 fL (ref 78.0–100.0)
Platelets: 211 10*3/uL (ref 150–400)
RBC: 3.6 MIL/uL — AB (ref 4.22–5.81)
RDW: 15.5 % (ref 11.5–15.5)
WBC: 10.1 10*3/uL (ref 4.0–10.5)

## 2013-01-10 LAB — CK TOTAL AND CKMB (NOT AT ARMC)
CK, MB: 1.4 ng/mL (ref 0.3–4.0)
Relative Index: INVALID (ref 0.0–2.5)
Total CK: 22 U/L (ref 7–232)

## 2013-01-10 LAB — TROPONIN I: Troponin I: <0.3 ng/mL (ref <0.30)

## 2013-01-10 MED ORDER — VANCOMYCIN 50 MG/ML ORAL SOLUTION
500.0000 mg | Freq: Four times a day (QID) | ORAL | Status: DC
  Administered 2013-01-10 – 2013-01-11 (×4): 500 mg via ORAL
  Filled 2013-01-10 (×7): qty 10

## 2013-01-10 MED ORDER — METRONIDAZOLE 500 MG PO TABS
500.0000 mg | ORAL_TABLET | Freq: Three times a day (TID) | ORAL | Status: DC
  Administered 2013-01-10 – 2013-01-13 (×10): 500 mg via ORAL
  Filled 2013-01-10 (×12): qty 1

## 2013-01-10 NOTE — Progress Notes (Signed)
[facility] PROGRESS NOTE  Henry Wyatt ZOX:[id number] DOB: [birth year] DOA: 01/05/2013 PCP: Ginette Otto, MD  Assessment/Plan: Principal Problem:   Septic shock(785.52) - Resolving on current antibiotic regimen and initial Tamiflu  Active Problems:   Severe sepsis(995.92) - Resolving on current antibiotic regimen    Diarrhea -  Most likely due to C. diffic. Resolving on oral vancomyn    HCAP (healthcare-associated pneumonia) - Suspect it initially but most likely not cause of respiratory distress. Chest x-ray is not convincing for infiltrates or consolidations and patient had elevated BNP. We'll discontinue Levaquin    Renal stone - Please see notes indicated below and urology consult notes. Patient will followup with urology after discharge    Hydronephrosis of left kidney - Patient is status post JJ stent placement. Urology on board    Altered mental status    DM (diabetes mellitus), type 2 with renal complications - Continue sliding scale insulin at this point, advance diet to diabetic diet once tolerated    AKI (acute kidney injury) - As listed below serum creatinine continues to improve    CKD (chronic kidney disease) - Currently improving with improved by mouth intake and after JJ stent placement described below and the neurology note    Influenza A - Initial problem which was treated with Tamiflu and patient finished course 01/07/2013    Acute pyelonephritis - Patient is status post JJ stent placement. Urine culture growing Escherichia coli the patient is currently on aztreonam Flagyl and vancomycin. Escherichia coli resistant to Levaquin as such Levaquin discontinued    Acute combined systolic and diastolic congestive heart failure - While in house was diuresed with Lasix and currently in respiratory condition much improved per patient. Most likely cause of shortness of breath    Other dysphagia - Currently tolerating full liquid diet  Code  Status: DNR Family Communication: No family at bedside to discuss results. Discussed directly with patient.  Disposition Plan: We'll continue current IV antibiotic regimen. Tried to reach Dr. Pete Glatter to discuss antibiotics but wasn't able to reach and we'll continue broad-spectrum antibiotics and if unable to reach Dr. Pete Glatter for specifics on allergy reaction to cephalosporins and may have to consult infectious disease for recommendations.   Consultants:  Urology   Procedures:  As mentioned above   Antibiotics:  As mentioned above   HPI/Subjective: No new problems reported overnight patient has no new complaints that he reported to me.  Objective: Filed Vitals:   01/10/13 1359  BP: 154/75  Pulse: 81  Temp: 99.6 F (37.6 C)  Resp: 18    Intake/Output Summary (Last 24 hours) at 01/10/13 1514 Last data filed at 01/10/13 1400  Gross per 24 hour  Intake   1390 ml  Output  81191 ml  Net -11110 ml   Filed Weights   01/05/13 0857 01/06/13 0530 01/07/13 0400  Weight: 102.1 kg (225 lb 1.4 oz) 104 kg (229 lb 4.5 oz) 102 kg (224 lb 13.9 oz)    Exam:   General:  Pt in NAD, Alert and awake  Cardiovascular: RRR, no MRG  Respiratory: no wheezes, no rhales  Abdomen: soft, NT, ND  Musculoskeletal: no cyanosis or clubbing   Data Reviewed: Basic Metabolic Panel:  Recent Labs Lab 01/06/13 0430 01/07/13 0430 01/08/13 0700 01/09/13 0503 01/10/13 0328  NA 134* 139 141 139 139  K 5.0 4.0 3.6* 3.6* 3.4*  CL 100 105 106 104 102  CO2 20 20 22 22 23   GLUCOSE 174* 97 178* 152* 201*  BUN 57* 63* 55* 47* 37*  CREATININE 2.81* 2.65* 2.13* 1.77* 1.52*  CALCIUM 8.4 8.5 8.5 8.6 8.7   Liver Function Tests:  Recent Labs Lab 01/05/13 0440 01/08/13 0700  AST 16 27  ALT 11 33  ALKPHOS 166* 85  BILITOT 0.5 0.4  PROT 6.4 5.8*  ALBUMIN 2.8* 2.1*   No results found for this basename: LIPASE, AMYLASE,  in the last 168 hours No results found for this basename: AMMONIA,   in the last 168 hours CBC:  Recent Labs Lab 01/05/13 1550 01/06/13 0430 01/06/13 1735 01/07/13 0430 01/08/13 0700 01/09/13 0503 01/10/13 0328  WBC 21.0* 22.1* 20.9* 21.1* 12.7* 10.5 10.1  NEUTROABS  --  20.5*  --   --   --   --   --   HGB 9.7* 9.6* 10.1* 9.9* 9.4* 9.9* 10.4*  HCT 30.1* 29.2* 30.0* 28.9* 29.2* 30.3* 31.0*  MCV 89.1 88.0 87.2 86.8 88.0 87.3 86.1  PLT 100* 88* 114* 116* 103* 151 211   Cardiac Enzymes:  Recent Labs Lab 01/10/13 0231 01/10/13 0850  CKTOTAL 22  --   CKMB 1.4  --   TROPONINI <0.30 <0.30   BNP (last 3 results)  Recent Labs  01/05/13 1600 01/09/13 0503  PROBNP 8741.0* 6577.0*   CBG:  Recent Labs Lab 01/09/13 1211 01/09/13 1711 01/09/13 2124 01/10/13 0735 01/10/13 1154  GLUCAP 158* 197* 171* 167* 177*    Recent Results (from the past 240 hour(s))  CLOSTRIDIUM DIFFICILE BY PCR     Status: Abnormal   Collection Time    01/05/13  5:25 AM      Result Value Range Status   C difficile by pcr POSITIVE (*) NEGATIVE Final   Comment: CRITICAL RESULT CALLED TO, READ BACK BY AND VERIFIED WITH:     SHEPHERD RN 9:35 01/05/13 ([email or web address])     Performed at [facility]  MRSA PCR SCREENING     Status: None   Collection Time    01/05/13  9:06 AM      Result Value Range Status   MRSA by PCR NEGATIVE  NEGATIVE Final   Comment:            The GeneXpert MRSA Assay (FDA     approved for NASAL specimens     only), is one component of a     comprehensive MRSA colonization     surveillance program. It is not     intended to diagnose MRSA     infection nor to guide or     monitor treatment for     MRSA infections.  CULTURE, BLOOD (ROUTINE X 2)     Status: None   Collection Time    01/05/13  9:30 AM      Result Value Range Status   Specimen Description BLOOD LEFT ARM   Final   Special Requests BOTTLES DRAWN AEROBIC AND ANAEROBIC 10CC   Final   Culture  Setup Time     Final   Value: 01/05/2013 14:04     Performed at [facility]    Culture     Final   Value: ESCHERICHIA COLI     Note: Gram Stain Report Called to,Read Back By and Verified With: Lezlie Lye [id number] BRMEL     Performed at [facility]   Report Status 01/08/2013 FINAL   Final   Organism ID, Bacteria ESCHERICHIA COLI   Final  CULTURE, BLOOD (ROUTINE X 2)     Status: None  Collection Time    01/05/13  9:35 AM      Result Value Range Status   Specimen Description BLOOD LEFT HAND   Final   Special Requests BOTTLES DRAWN AEROBIC ONLY 5CC   Final   Culture  Setup Time     Final   Value: 01/05/2013 14:04     Performed at [facility]   Culture     Final   Value:        BLOOD CULTURE RECEIVED NO GROWTH TO DATE CULTURE WILL BE HELD FOR 5 DAYS BEFORE ISSUING A FINAL NEGATIVE REPORT     Performed at [facility]   Report Status PENDING   Incomplete  URINE CULTURE     Status: None   Collection Time    01/05/13 10:27 AM      Result Value Range Status   Specimen Description URINE, CATHETERIZED   Final   Special Requests NONE   Final   Culture  Setup Time     Final   Value: 01/05/2013 14:03     Performed at [facility]   Colony Count     Final   Value: >=100,000 COLONIES/ML     Performed at [facility]   Culture     Final   Value: ESCHERICHIA COLI     Performed at [facility]   Report Status 01/09/2013 FINAL   Final   Organism ID, Bacteria ESCHERICHIA COLI   Final     Studies: No results found.  Scheduled Meds: . antiseptic oral rinse  15 mL Mouth Rinse q12n4p  . apixaban  5 mg Oral BID  . aztreonam  1 g Intravenous Q8H  . chlorhexidine  15 mL Mouth Rinse BID  . feeding supplement (RESOURCE BREEZE)  1 Container Oral BID BM  . furosemide  20 mg Intravenous Q12H  . insulin aspart  0-5 Units Subcutaneous QHS  . insulin aspart  0-9 Units Subcutaneous TID WC  . metroNIDAZOLE  500 mg Oral Q8H  . vancomycin  500 mg Oral Q6H   Continuous Infusions:   Principal Problem:   Septic  shock(785.52) Active Problems:   Severe sepsis(995.92)   Diarrhea   HCAP (healthcare-associated pneumonia)   Renal stone   Hydronephrosis of left kidney   Altered mental status   DM (diabetes mellitus), type 2 with renal complications   AKI (acute kidney injury)   CKD (chronic kidney disease)   C. difficile diarrhea   Influenza A   Acute pyelonephritis   Acute combined systolic and diastolic congestive heart failure   Other dysphagia    Time spent: > 35 minutes    Penny PiaVEGA, Klyde Banka  [facility] Pager [phone]. If 7PM-7AM, please contact night-coverage at [email or web address], password [email or web address] 01/10/2013, 3:14 PM  LOS: 5 days

## 2013-01-10 NOTE — Care Management Note (Signed)
    Page 1 of 1   01/10/2013     3:37:45 PM   CARE MANAGEMENT NOTE 01/10/2013  Patient:  Gwyneth SproutLAVELLE,Kevin   Account Number:  [id number]  Date Initiated:  01/10/2013  Documentation initiated by:  Lorenda IshiharaPEELE,Elfrida Pixley  Subjective/Objective Assessment:   73 yo male admitted with sepsis. [facility]     Action/Plan:   Was at ALF will need SNF at d/c   Anticipated DC Date:  01/12/2013   Anticipated DC Plan:  SKILLED NURSING FACILITY  In-house referral  Clinical Social Worker      DC Planning Services  CM consult      Choice offered to / List presented to:             Status of service:  Completed, signed off Medicare Important Message given?   (If response is "NO", the following Medicare IM given date fields will be blank) Date Medicare IM given:   Date Additional Medicare IM given:    Discharge Disposition:  SKILLED NURSING FACILITY  Per UR Regulation:  Reviewed for med. necessity/level of care/duration of stay  If discussed at Long Length of Stay Meetings, dates discussed:    Comments:

## 2013-01-10 NOTE — Progress Notes (Signed)
ANTIBIOTIC CONSULT NOTE - FOLLOW UP  Pharmacy Consult for Aztreonam, Vancomycin PO, renally dose adjust antibiotics Indication: pneumonia/UTI with e. Coli bacteremia/C.Diff  Assessment: 25 YOM presents with septic shock due to influenza, pneumonia, C. diffiicle and e. Coli bacteremia d/t obstructive pyelonephritis (purulent material seen during placement of double-J stent by urology).  Pharmacy consulted today to dose oral vancomycin for CDiff.  This appears to be initial episode of CDiff and is considered severe/complicated d/t presence of shock. Pt has been improving (decreased #stools and improved WBC) therefore flagyl changed to PO.   Day #6 Antibiotics 12/31 >> Vancomycin IV x 1 12/31 >> Aztreonam >> 12/31 >> Vancomycin PO >>  12/31 >> Metronidazole >> 12/31 >> tamiflu >> 1/4 1/1 >> levofloxacin >>1/4  Tmax: Afeb WBCs: 10.1  Renal: SCr 1.52 (improved) for CrCl = 39ml/min (C-G)  12/31 bloodx2: E. Coli (R to amp, amp/sulb, cefazolin, cipro, pip/tazo) 12/31 urine: E.coli (R to amp, cefazolin, pip/tazo, fluoroquinolones) 12/31 Cdiff: positive 12/31 Influenza PCR: Positive influenza A.  Goal of Therapy:  appropriately dose antibiotic per renal function and indication  Plan:  Continue vancomycin 500mg  PO q 6 hours (with flagyl 500mg  PO q 8 hours) x 14 days total (12/31 - 1/13) Continue aztreonam 1gm IV q8h  Microbiology to drop E-test to check aztreonam susceptibility to e. Coli  Unable to clarify antibiotic allergies, patient was poor historian and NH only has allergy list without reactions  Patient improving clinically per d/w [facility]  Haynes Hoehn, PharmD, BCPS 01/10/2013, 1:42 PM  Pager: [phone]     Allergies  Allergen Reactions  . Carbapenems Other (See Comments)    Per nursing home [facility]  . Cephalosporins Other (See Comments)    Per nursing home [facility]  . Erythromycin Other (See Comments)    Per nursing home [facility]  . Penicillins Other (See Comments)    Per nursing home  mar  . Sulfa Antibiotics Other (See Comments)    Per nursing home [facility]    Patient Measurements: Height: 6\' 1"  (185.4 cm) Weight: 224 lb 13.9 oz (102 kg) IBW/kg (Calculated) : 79.9 Adjusted Body Weight:   Vital Signs: Temp: 99.2 F (37.3 C) (01/05 0701) Temp src: Oral (01/05 0701) BP: 167/75 mmHg (01/05 0701) Pulse Rate: 77 (01/05 0701) Intake/Output from previous day: 01/04 0701 - 01/05 0700 In: 1870 [P.O.:1320; IV Piggyback:550] Out: 4100 [Urine:4100] Intake/Output from this shift: Total I/O In: 120 [P.O.:120] Out: 29562 [Urine:10000]  Labs:  Recent Labs  01/08/13 0700 01/09/13 0503 01/10/13 0328  WBC 12.7* 10.5 10.1  HGB 9.4* 9.9* 10.4*  PLT 103* 151 211  CREATININE 2.13* 1.77* 1.52*   Estimated Creatinine Clearance: 54.3 ml/min (by C-G formula based on Cr of 1.52). No results found for this basename: VANCOTROUGH, Leodis Binet, VANCORANDOM, GENTTROUGH, GENTPEAK, GENTRANDOM, TOBRATROUGH, TOBRAPEAK, TOBRARND, AMIKACINPEAK, AMIKACINTROU, AMIKACIN,  in the last 72 hours   Microbiology: Recent Results (from the past 720 hour(s))  CLOSTRIDIUM DIFFICILE BY PCR     Status: Abnormal   Collection Time    01/05/13  5:25 AM      Result Value Range Status   C difficile by pcr POSITIVE (*) NEGATIVE Final   Comment: CRITICAL RESULT CALLED TO, READ BACK BY AND VERIFIED WITH:     SHEPHERD RN 9:35 01/05/13 ([email or web address])     Performed at [facility]  MRSA PCR SCREENING     Status: None   Collection Time    01/05/13  9:06 AM      Result Value Range  Status   MRSA by PCR NEGATIVE  NEGATIVE Final   Comment:            The GeneXpert MRSA Assay (FDA     approved for NASAL specimens     only), is one component of a     comprehensive MRSA colonization     surveillance program. It is not     intended to diagnose MRSA     infection nor to guide or     monitor treatment for     MRSA infections.  CULTURE, BLOOD (ROUTINE X 2)     Status: None   Collection Time    01/05/13  9:30  AM      Result Value Range Status   Specimen Description BLOOD LEFT ARM   Final   Special Requests BOTTLES DRAWN AEROBIC AND ANAEROBIC 10CC   Final   Culture  Setup Time     Final   Value: 01/05/2013 14:04     Performed at [facility]   Culture     Final   Value: ESCHERICHIA COLI     Note: Gram Stain Report Called to,Read Back By and Verified With: Lezlie LyeLISA FREI [id number] BRMEL     Performed at [facility]   Report Status 01/08/2013 FINAL   Final   Organism ID, Bacteria ESCHERICHIA COLI   Final  CULTURE, BLOOD (ROUTINE X 2)     Status: None   Collection Time    01/05/13  9:35 AM      Result Value Range Status   Specimen Description BLOOD LEFT HAND   Final   Special Requests BOTTLES DRAWN AEROBIC ONLY 5CC   Final   Culture  Setup Time     Final   Value: 01/05/2013 14:04     Performed at [facility]   Culture     Final   Value:        BLOOD CULTURE RECEIVED NO GROWTH TO DATE CULTURE WILL BE HELD FOR 5 DAYS BEFORE ISSUING A FINAL NEGATIVE REPORT     Performed at [facility]   Report Status PENDING   Incomplete  URINE CULTURE     Status: None   Collection Time    01/05/13 10:27 AM      Result Value Range Status   Specimen Description URINE, CATHETERIZED   Final   Special Requests NONE   Final   Culture  Setup Time     Final   Value: 01/05/2013 14:03     Performed at [facility]   Colony Count     Final   Value: >=100,000 COLONIES/ML     Performed at [facility]   Culture     Final   Value: ESCHERICHIA COLI     Performed at [facility]   Report Status 01/09/2013 FINAL   Final   Organism ID, Bacteria ESCHERICHIA COLI   Final    Anti-infectives   Start     Dose/Rate Route Frequency Ordered Stop   01/10/13 1400  metroNIDAZOLE (FLAGYL) tablet 500 mg     500 mg Oral 3 times per day 01/10/13 1019     01/09/13 1000  oseltamivir (TAMIFLU) capsule 75 mg     75 mg Oral Daily 01/08/13 1349 01/09/13 1005   01/08/13 1000   oseltamivir (TAMIFLU) capsule 75 mg  Status:  Discontinued     75 mg Oral Daily 01/07/13 0928 01/08/13 1349   01/06/13 1200  levofloxacin (LEVAQUIN)  IVPB 750 mg  Status:  Discontinued     750 mg 100 mL/hr over 90 Minutes Intravenous Every 24 hours 01/06/13 1045 01/06/13 1102   01/06/13 1200  levofloxacin (LEVAQUIN) IVPB 750 mg  Status:  Discontinued     750 mg 100 mL/hr over 90 Minutes Intravenous Every 48 hours 01/06/13 1102 01/09/13 1123   01/06/13 1000  oseltamivir (TAMIFLU) capsule 30 mg  Status:  Discontinued     30 mg Oral Daily 01/05/13 0901 01/07/13 0928   01/05/13 2200  metroNIDAZOLE (FLAGYL) IVPB 500 mg  Status:  Discontinued     500 mg 100 mL/hr over 60 Minutes Intravenous Every 8 hours 01/05/13 1511 01/10/13 1018   01/05/13 1800  aztreonam (AZACTAM) 1 g in dextrose 5 % 50 mL IVPB     1 g 100 mL/hr over 30 Minutes Intravenous Every 8 hours 01/05/13 0856 01/13/13 1759   01/05/13 1800  vancomycin (VANCOCIN) 500 mg in sodium chloride irrigation 0.9 % 100 mL ENEMA  Status:  Discontinued     500 mg Rectal 4 times per day 01/05/13 1511 01/05/13 1717   01/05/13 1200  vancomycin (VANCOCIN) 50 mg/mL oral solution 500 mg  Status:  Discontinued     500 mg Oral 4 times per day 01/05/13 1041 01/10/13 1312   01/05/13 1200  metroNIDAZOLE (FLAGYL) IVPB 500 mg  Status:  Discontinued     500 mg 100 mL/hr over 60 Minutes Intravenous Every 8 hours 01/05/13 1041 01/05/13 1510   01/05/13 1000  vancomycin (VANCOCIN) 2,000 mg in sodium chloride 0.9 % 500 mL IVPB     2,000 mg 250 mL/hr over 120 Minutes Intravenous  Once 01/05/13 0901 01/05/13 1200   01/05/13 0830  aztreonam (AZACTAM) 2 g in dextrose 5 % 50 mL IVPB     2 g 100 mL/hr over 30 Minutes Intravenous STAT 01/05/13 0821 01/05/13 0950   01/05/13 0600  levofloxacin (LEVAQUIN) IVPB 750 mg     750 mg 100 mL/hr over 90 Minutes Intravenous  Once 01/05/13 0545 01/05/13 0923   01/05/13 0330  oseltamivir (TAMIFLU) capsule 75 mg  Status:   Discontinued     75 mg Oral 2 times daily 01/05/13 0317 01/05/13 0901

## 2013-01-11 LAB — GLUCOSE, CAPILLARY
GLUCOSE-CAPILLARY: 159 mg/dL — AB (ref 70–99)
Glucose-Capillary: 166 mg/dL — ABNORMAL HIGH (ref 70–99)
Glucose-Capillary: 144 mg/dL — ABNORMAL HIGH (ref 70–99)
Glucose-Capillary: 166 mg/dL — ABNORMAL HIGH (ref 70–99)

## 2013-01-11 LAB — CULTURE, BLOOD (ROUTINE X 2): Culture: NO GROWTH

## 2013-01-11 NOTE — Progress Notes (Signed)
[facility] PROGRESS NOTE  Henry SproutWilliam Kea ZOX:[id number] DOB: 1940/06/12 DOA: 01/05/2013 PCP: Ginette OttoSTONEKING,HAL THOMAS, MD  Assessment/Plan: Principal Problem:   Septic shock(785.52) (2ary to urinary source) - Resolved. Much improvement with IV antibiotics and JJ stent placement. - Consulted ID for recommendations regarding antibiotic regimen on discharge.  Active Problems:   Diarrhea -  Most likely due to C. diffic. Was on oral vancomycin and metronidazole. - will discontinue oral vancomycin    HCAP (healthcare-associated pneumonia) - Suspected it initially but most likely not cause of respiratory distress. Chest x-ray is not convincing for infiltrates or consolidations and patient had elevated BNP. We'll discontinue Levaquin    Renal stone - Please see notes indicated below and urology consult notes. Patient will followup with urology after discharge    Hydronephrosis of left kidney - Patient is status post JJ stent placement. Urology on board    Altered mental status    DM (diabetes mellitus), type 2 with renal complications - Continue sliding scale insulin at this point, advance diet to diabetic diet once tolerated    AKI (acute kidney injury) - As listed below serum creatinine continues to improve    CKD (chronic kidney disease) - Currently improving with improved by mouth intake and after JJ stent placement described below and the neurology note    Influenza A - Initial problem which was treated with Tamiflu and patient finished course 01/07/2013    Acute pyelonephritis - Patient is status post JJ stent placement. Urine culture growing Escherichia coli the patient is currently on aztreonam and Flagyl Escherichia coli resistant to Levaquin as such Levaquin discontinued    Acute combined systolic and diastolic congestive heart failure - While in house was diuresed with Lasix and currently  respiratory condition much improved per patient. Most likely cause of  shortness of breath    Other dysphagia - Currently tolerating full liquid diet  Code Status: DNR Family Communication: No family at bedside to discuss results. Discussed directly with patient.  Disposition Plan: We'll continue current IV antibiotic regimen. Patient's allergies to antibiotics unknown currently. Will consult ID for further recommendations   Consultants:  Urology   ID  Procedures:  As mentioned above    HPI/Subjective: No new problems reported overnight.  Objective: Filed Vitals:   01/11/13 0541  BP: 185/85  Pulse: 85  Temp: 99 F (37.2 C)  Resp: 18    Intake/Output Summary (Last 24 hours) at 01/11/13 1932 Last data filed at 01/11/13 1800  Gross per 24 hour  Intake    320 ml  Output   4025 ml  Net  -3705 ml   Filed Weights   01/05/13 0857 01/06/13 0530 01/07/13 0400  Weight: 102.1 kg (225 lb 1.4 oz) 104 kg (229 lb 4.5 oz) 102 kg (224 lb 13.9 oz)    Exam:   General:  Pt in NAD, Alert and awake  Cardiovascular: RRR, no MRG  Respiratory: no wheezes, no rhales  Abdomen: soft, NT, ND  Musculoskeletal: no cyanosis or clubbing   Data Reviewed: Basic Metabolic Panel:  Recent Labs Lab 01/06/13 0430 01/07/13 0430 01/08/13 0700 01/09/13 0503 01/10/13 0328  NA 134* 139 141 139 139  K 5.0 4.0 3.6* 3.6* 3.4*  CL 100 105 106 104 102  CO2 20 20 22 22 23   GLUCOSE 174* 97 178* 152* 201*  BUN 57* 63* 55* 47* 37*  CREATININE 2.81* 2.65* 2.13* 1.77* 1.52*  CALCIUM 8.4 8.5 8.5 8.6 8.7   Liver Function Tests:  Recent Labs  Lab 01/05/13 0440 01/08/13 0700  AST 16 27  ALT 11 33  ALKPHOS 166* 85  BILITOT 0.5 0.4  PROT 6.4 5.8*  ALBUMIN 2.8* 2.1*   No results found for this basename: LIPASE, AMYLASE,  in the last 168 hours No results found for this basename: AMMONIA,  in the last 168 hours CBC:  Recent Labs Lab 01/05/13 1550 01/06/13 0430 01/06/13 1735 01/07/13 0430 01/08/13 0700 01/09/13 0503 01/10/13 0328  WBC 21.0* 22.1*  20.9* 21.1* 12.7* 10.5 10.1  NEUTROABS  --  20.5*  --   --   --   --   --   HGB 9.7* 9.6* 10.1* 9.9* 9.4* 9.9* 10.4*  HCT 30.1* 29.2* 30.0* 28.9* 29.2* 30.3* 31.0*  MCV 89.1 88.0 87.2 86.8 88.0 87.3 86.1  PLT 100* 88* 114* 116* 103* 151 211   Cardiac Enzymes:  Recent Labs Lab 01/10/13 0231 01/10/13 0850 01/10/13 1505  CKTOTAL 22  --   --   CKMB 1.4  --   --   TROPONINI <0.30 <0.30 <0.30   BNP (last 3 results)  Recent Labs  01/05/13 1600 01/09/13 0503  PROBNP 8741.0* 6577.0*   CBG:  Recent Labs Lab 01/10/13 1609 01/10/13 2146 01/11/13 0712 01/11/13 1117 01/11/13 1657  GLUCAP 207* 169* 166* 144* 166*    Recent Results (from the past 240 hour(s))  CLOSTRIDIUM DIFFICILE BY PCR     Status: Abnormal   Collection Time    01/05/13  5:25 AM      Result Value Range Status   C difficile by pcr POSITIVE (*) NEGATIVE Final   Comment: CRITICAL RESULT CALLED TO, READ BACK BY AND VERIFIED WITH:     SHEPHERD RN 9:35 01/05/13 ([email or web address])     Performed at [facility]  MRSA PCR SCREENING     Status: None   Collection Time    01/05/13  9:06 AM      Result Value Range Status   MRSA by PCR NEGATIVE  NEGATIVE Final   Comment:            The GeneXpert MRSA Assay (FDA     approved for NASAL specimens     only), is one component of a     comprehensive MRSA colonization     surveillance program. It is not     intended to diagnose MRSA     infection nor to guide or     monitor treatment for     MRSA infections.  CULTURE, BLOOD (ROUTINE X 2)     Status: None   Collection Time    01/05/13  9:30 AM      Result Value Range Status   Specimen Description BLOOD LEFT ARM   Final   Special Requests BOTTLES DRAWN AEROBIC AND ANAEROBIC 10CC   Final   Culture  Setup Time     Final   Value: 01/05/2013 14:04     Performed at [facility]   Culture     Final   Value: ESCHERICHIA COLI     Note: Gram Stain Report Called to,Read Back By and Verified With: Lezlie Lye [id number]  [id number] BRMEL     Performed at [facility]   Report Status 01/08/2013 FINAL   Final   Organism ID, Bacteria ESCHERICHIA COLI   Final  CULTURE, BLOOD (ROUTINE X 2)     Status: None   Collection Time    01/05/13  9:35 AM      Result Value Range  Status   Specimen Description BLOOD LEFT HAND   Final   Special Requests BOTTLES DRAWN AEROBIC ONLY 5CC   Final   Culture  Setup Time     Final   Value: 01/05/2013 14:04     Performed at [facility]   Culture     Final   Value: NO GROWTH 5 DAYS     Performed at [facility]   Report Status 01/11/2013 FINAL   Final  URINE CULTURE     Status: None   Collection Time    01/05/13 10:27 AM      Result Value Range Status   Specimen Description URINE, CATHETERIZED   Final   Special Requests NONE   Final   Culture  Setup Time     Final   Value: 01/05/2013 14:03     Performed at [facility] Count     Final   Value: >=100,000 COLONIES/ML     Performed at [facility]   Culture     Final   Value: ESCHERICHIA COLI     Performed at [facility]   Report Status 01/09/2013 FINAL   Final   Organism ID, Bacteria ESCHERICHIA COLI   Final     Studies: No results found.  Scheduled Meds: . antiseptic oral rinse  15 mL Mouth Rinse q12n4p  . apixaban  5 mg Oral BID  . aztreonam  1 g Intravenous Q8H  . chlorhexidine  15 mL Mouth Rinse BID  . feeding supplement (RESOURCE BREEZE)  1 Container Oral BID BM  . furosemide  20 mg Intravenous Q12H  . insulin aspart  0-5 Units Subcutaneous QHS  . insulin aspart  0-9 Units Subcutaneous TID WC  . metroNIDAZOLE  500 mg Oral Q8H   Continuous Infusions:   Principal Problem:   Septic shock(785.52) Active Problems:   Severe sepsis(995.92)   Diarrhea   HCAP (healthcare-associated pneumonia)   Renal stone   Hydronephrosis of left kidney   Altered mental status   DM (diabetes mellitus), type 2 with renal complications   AKI (acute kidney injury)   CKD  (chronic kidney disease)   C. difficile diarrhea   Influenza A   Acute pyelonephritis   Acute combined systolic and diastolic congestive heart failure   Other dysphagia    Time spent: > 35 minutes    Penny Pia  [facility] Pager [phone]. If 7PM-7AM, please contact night-coverage at [email or web address], password [facility] 01/11/2013, 7:32 PM  LOS: 6 days

## 2013-01-11 NOTE — Progress Notes (Signed)
Speech Language Pathology Treatment: Dysphagia  Patient Details Name: Henry Wyatt MRN: [id number] DOB: 02/23/1940 Today's Date: 01/11/2013 Time: [id number] SLP Time Calculation (min): 23 min  Assessment / Plan / Recommendation Clinical Impression  Pt today is lethargic with near full lunch tray at bedside, as pt states he is not hungry.  Pt accepted cold water from SLP, no overt coughing/symptoms of compromised airway protection.  SLP helped pt to eat a few bites of magic cup and graham cracker - oral stasis apparent with solids with poor pt awareness- sensory and motor deficits apparent.  Nurse secretary reports pt has been sleepy much of the day today.   When advance diet, recommend puree currently due to weakness.   Suspect intake may continue to be poor if pt remains sleepy.  Hopeful for dietary advancement as pt medically improves.   Will follow.    HPI HPI: 73 yo male resident of snf admitted with ams, found to have flu, cdif and be septic.  PMH + for dysarthria per snf referral form, DM, HTN.  Pt underwent cystoscopy with stent placement.  Bedside swallow eval completed on Friday with aspiration precautions recommended.  Pt seen for follow up therapy to assess tolerance of po diet.      Pertinent Vitals Afebrile, decreased  SLP Plan  Continue with current plan of care    Recommendations Diet recommendations: Dysphagia 1 (puree);Thin liquid Liquids provided via: Cup;Straw Medication Administration: Whole meds with puree Supervision: Full supervision/cueing for compensatory strategies;Staff to assist with self feeding Compensations: Slow rate;Small sips/bites Postural Changes and/or Swallow Maneuvers: Seated upright 90 degrees;Upright 30-60 min after meal              Oral Care Recommendations: Oral care BID Follow up Recommendations: Skilled Nursing facility Plan: Continue with current plan of care    GO     Mills KollerKimball, Henry Seng Ann Keshonda Monsour, MS [facility] SLP [phone]

## 2013-01-11 NOTE — Progress Notes (Signed)
6 Days Post-Op Subjective: Patient  Has continued to improve clinically.  Possible discharge in a.m.  Objective: Vital signs in last 24 hours: Temp:  [98 F (36.7 C)-99 F (37.2 C)] 99 F (37.2 C) (01/06 0541) Pulse Rate:  [83-85] 85 (01/06 0541) Resp:  [18] 18 (01/06 0541) BP: (164-485)/(85-88) 485/85 mmHg (01/06 0541) SpO2:  [93 %-94 %] 93 % (01/06 0541)  Intake/Output from previous day: 01/05 0701 - 01/06 0700 In: 480 [P.O.:480] Out: 40981 [Urine:13075] Intake/Output this shift: Total I/O In: 320 [P.O.:320] Out: 1450 [Urine:1450]  Physical Exam:  Constitutional: Vital signs reviewed. WD WN in NAD   Eyes: PERRL, No scleral icterus.   Cardiovascular: RRR Pulmonary/Chest: Normal effort Abdominal: Soft. Non-tender, non-distended, bowel sounds are normal, no masses, organomegaly, or guarding present.  Genitourinary:No change Extremities: No cyanosis or edema   Lab Results:  Recent Labs  01/09/13 0503 01/10/13 0328  HGB 9.9* 10.4*  HCT 30.3* 31.0*   BMET  Recent Labs  01/09/13 0503 01/10/13 0328  NA 139 139  K 3.6* 3.4*  CL 104 102  CO2 22 23  GLUCOSE 152* 201*  BUN 47* 37*  CREATININE 1.77* 1.52*  CALCIUM 8.6 8.7   No results found for this basename: LABPT, INR,  in the last 72 hours No results found for this basename: LABURIN,  in the last 72 hours Results for orders placed during the hospital encounter of 01/05/13  CLOSTRIDIUM DIFFICILE BY PCR     Status: Abnormal   Collection Time    01/05/13  5:25 AM      Result Value Range Status   C difficile by pcr POSITIVE (*) NEGATIVE Final   Comment: CRITICAL RESULT CALLED TO, READ BACK BY AND VERIFIED WITH:     SHEPHERD RN 9:35 01/05/13 (wilsonm)     Performed at [facility]  MRSA PCR SCREENING     Status: None   Collection Time    01/05/13  9:06 AM      Result Value Range Status   MRSA by PCR NEGATIVE  NEGATIVE Final   Comment:            The GeneXpert MRSA Assay (FDA     approved for NASAL  specimens     only), is one component of a     comprehensive MRSA colonization     surveillance program. It is not     intended to diagnose MRSA     infection nor to guide or     monitor treatment for     MRSA infections.  CULTURE, BLOOD (ROUTINE X 2)     Status: None   Collection Time    01/05/13  9:30 AM      Result Value Range Status   Specimen Description BLOOD LEFT ARM   Final   Special Requests BOTTLES DRAWN AEROBIC AND ANAEROBIC 10CC   Final   Culture  Setup Time     Final   Value: 01/05/2013 14:04     Performed at [facility]   Culture     Final   Value: ESCHERICHIA COLI     Note: Gram Stain Report Called to,Read Back By and Verified With: Lezlie Lye [id number] BRMEL     Performed at [facility]   Report Status 01/08/2013 FINAL   Final   Organism ID, Bacteria ESCHERICHIA COLI   Final  CULTURE, BLOOD (ROUTINE X 2)     Status: None   Collection Time    01/05/13  9:35 AM      Result Value Range Status   Specimen Description BLOOD LEFT HAND   Final   Special Requests BOTTLES DRAWN AEROBIC ONLY 5CC   Final   Culture  Setup Time     Final   Value: 01/05/2013 14:04     [facility]formed at [facility]   Culture     Final   Value: NO GROWTH 5 DAYS     Performed at [facility]   Report Status 01/11/2013 FINAL   Final  URINE CULTURE     Status: None   Collection Time    01/05/13 10:27 AM      Result Value Range Status   Specimen Description URINE, CATHETERIZED   Final   Special Requests NONE   Final   Culture  Setup Time     Final   Value: 01/05/2013 14:03     [facility]   Colony Count     Final   Value: >=100,000 COLONIES/ML     Performed at [facility]   Culture     Final   Value: ESCHERICHIA COLI     [facility]formed at [facility]   Report Status 01/09/2013 FINAL   Final   Organism ID, Bacteria ESCHERICHIA COLI   Final    Studies/Results: No results found.  Assessment/Plan:   It appears  that the patient be ready for discharge back to skilled nursing facility within the next 24-48 hours.  Oral antibiotic choices are limited based on extensive allergies.  I will need to arrange for definitive stone management some time in the next 1-2 weeks.   LOS: 6 days   Milynn Quirion S 01/11/2013, 4:40 PM

## 2013-01-11 NOTE — Progress Notes (Signed)
Physical Therapy Treatment Patient Details Name: Henry Wyatt MRN: [id number] DOB: [birth year] Today's Date: 01/11/2013 Time: 1550-1606 PT Time Calculation (min): 16 min  PT Assessment / Plan / Recommendation  History of Present Illness Henry Wyatt  is a 73 y.o. male, nursing home resident, with history of R shoulder replacement, back surgery 3 yrs ago with progressive decline per wife,  hypertension and diabetes, sent from the nursing home for evaluation for fever and change in mental status. fluA positive, C diff positive, L obstructive pyelonephritis, s/p stent 12/31.   PT Comments   Pt required +2 assist for bed mobility. Somewhat lethargic today. Still unable to stand even with +2 assist. Recommend SNF.   Follow Up Recommendations  SNF;Supervision/Assistance - 24 hour     Does the patient have the potential to tolerate intense rehabilitation     Barriers to Discharge        Equipment Recommendations  None recommended by PT    Recommendations for Other Services OT consult  Frequency Min 3X/week   Progress towards PT Goals Progress towards PT goals: Not progressing toward goals - comment (requiring increased assistance this session.)  Plan Current plan remains appropriate    Precautions / Restrictions Precautions Precautions: Fall Restrictions Weight Bearing Restrictions: No   Pertinent Vitals/Pain L LE-unable to rate "that leg hurts"    Mobility  Bed Mobility Bed Mobility: Supine to Sit;Sit to Supine Supine to Sit: 1: +2 Total assist;HOB elevated Supine to Sit: Patient Percentage: 30% Sit to Supine: 1: +2 Total assist;HOB elevated Sit to Supine: Patient Percentage: 30% Details for Bed Mobility Assistance: Assist for trunk and bil LEs. Utilized bedpad for scooting, positioning. Increased time.  Transfers Transfers: Sit to Stand;Stand to Sit Sit to Stand: 1: +2 Total assist;From elevated surface Sit to Stand: Patient Percentage: 10% Stand to Sit: 1: +2 Total  assist;To bed;To elevated surface Stand to Sit: Patient Percentage: 10% Details for Transfer Assistance: Attempted sit<.stand x 2-pt unable to achieve full standing position before "giving out". Pt requested to return to bed.  Ambulation/Gait Ambulation/Gait Assistance: Not tested (comment)    Exercises     PT Diagnosis:    PT Problem List:   PT Treatment Interventions:     PT Goals (current goals can now be found in the care plan section)    Visit Information  Last PT Received On: 01/11/13 Assistance Needed: +2 History of Present Illness: Henry Wyatt  is a 73 y.o. male, nursing home resident, with history of R shoulder replacement, back surgery 3 yrs ago with progressive decline per wife,  hypertension and diabetes, sent from the nursing home for evaluation for fever and change in mental status. fluA positive, C diff positive, L obstructive pyelonephritis, s/p stent 12/31.    Subjective Data      Cognition  Cognition Arousal/Alertness: Lethargic Behavior During Therapy: WFL for tasks assessed/performed Overall Cognitive Status: History of cognitive impairments - at baseline    Balance  Balance Balance Assessed: Yes Static Sitting Balance Static Sitting - Balance Support: Bilateral upper extremity supported;Feet supported Static Sitting - Level of Assistance: 4: Min assist Static Sitting - Comment/# of Minutes: Able to progress to Min guard assist after sitting for at least 5 minutes.   End of Session PT - End of Session Activity Tolerance: Patient limited by fatigue;Patient limited by lethargy Patient left: in bed;with call bell/phone within reach Nurse Communication: Mobility status   GP     Rebeca AlertJannie Lazaro Isenhower, MPT Pager: [phone]

## 2013-01-12 DIAGNOSIS — N12 Tubulo-interstitial nephritis, not specified as acute or chronic: Secondary | ICD-10-CM

## 2013-01-12 DIAGNOSIS — A498 Other bacterial infections of unspecified site: Secondary | ICD-10-CM

## 2013-01-12 LAB — BASIC METABOLIC PANEL
BUN: 25 mg/dL — AB (ref 6–23)
CALCIUM: 8.6 mg/dL (ref 8.4–10.5)
CHLORIDE: 100 meq/L (ref 96–112)
CO2: 24 meq/L (ref 19–32)
Creatinine, Ser: 1.39 mg/dL — ABNORMAL HIGH (ref 0.50–1.35)
GFR calc Af Amer: 56 mL/min — ABNORMAL LOW (ref >90)
GFR calc non Af Amer: 49 mL/min — ABNORMAL LOW (ref >90)
Glucose, Bld: 152 mg/dL — ABNORMAL HIGH (ref 70–99)
Potassium: 3.1 mEq/L — ABNORMAL LOW (ref 3.7–5.3)
Sodium: 138 mEq/L (ref 137–147)

## 2013-01-12 LAB — CBC
HEMATOCRIT: 32.5 % — AB (ref 39.0–52.0)
Hemoglobin: 10.7 g/dL — ABNORMAL LOW (ref 13.0–17.0)
MCH: 28.7 pg (ref 26.0–34.0)
MCHC: 32.9 g/dL (ref 30.0–36.0)
MCV: 87.1 fL (ref 78.0–100.0)
Platelets: 324 10*3/uL (ref 150–400)
RBC: 3.73 MIL/uL — ABNORMAL LOW (ref 4.22–5.81)
RDW: 15.8 % — AB (ref 11.5–15.5)
WBC: 15.1 10*3/uL — AB (ref 4.0–10.5)

## 2013-01-12 LAB — GLUCOSE, CAPILLARY
Glucose-Capillary: 187 mg/dL — ABNORMAL HIGH (ref 70–99)
Glucose-Capillary: 243 mg/dL — ABNORMAL HIGH (ref 70–99)
Glucose-Capillary: 191 mg/dL — ABNORMAL HIGH (ref 70–99)
Glucose-Capillary: 148 mg/dL — ABNORMAL HIGH (ref 70–99)

## 2013-01-12 MED ORDER — SULFAMETHOXAZOLE-TRIMETHOPRIM 200-40 MG/5ML PO SUSP: 0.5000 mL | Freq: Once | ORAL | Status: DC

## 2013-01-12 MED ORDER — NONFORMULARY OR COMPOUNDED ITEM
30.0000 mL | Freq: Once | Status: AC
  Administered 2013-01-12: 30 mL via ORAL
  Filled 2013-01-12: qty 1

## 2013-01-12 MED ORDER — NONFORMULARY OR COMPOUNDED ITEM
5.0000 mL | Freq: Once | Status: AC
  Administered 2013-01-12: 5 mL via ORAL
  Filled 2013-01-12: qty 1

## 2013-01-12 MED ORDER — NONFORMULARY OR COMPOUNDED ITEM
30.0000 mL | Freq: Once | Status: AC
Start: 2013-01-12 — End: 2013-01-12
  Administered 2013-01-12: 30 mL via ORAL
  Filled 2013-01-12: qty 1

## 2013-01-12 MED ORDER — SULFAMETHOXAZOLE-TRIMETHOPRIM 200-40 MG/5ML PO SUSP
20.0000 mL | Freq: Once | ORAL | Status: AC
  Administered 2013-01-12: 20 mL via ORAL
  Filled 2013-01-12: qty 20

## 2013-01-12 NOTE — Consult Note (Signed)
[facility]     Reason for Consult: antibiotic selection    Referring Physician: Dr. Cena Benton  Principal Problem:   Septic shock(785.52) Active Problems:   Severe sepsis(995.92)   Diarrhea   HCAP (healthcare-associated pneumonia)   Renal stone   Hydronephrosis of left kidney   Altered mental status   DM (diabetes mellitus), type 2 with renal complications   AKI (acute kidney injury)   CKD (chronic kidney disease)   C. difficile diarrhea   Influenza A   Acute pyelonephritis   Acute combined systolic and diastolic congestive heart failure   Other dysphagia   Hypokalemia   . antiseptic oral rinse  15 mL Mouth Rinse q12n4p  . apixaban  5 mg Oral BID  . aztreonam  1 g Intravenous Q8H  . chlorhexidine  15 mL Mouth Rinse BID  . feeding supplement (RESOURCE BREEZE)  1 Container Oral BID BM  . furosemide  20 mg Intravenous Q12H  . insulin aspart  0-5 Units Subcutaneous QHS  . insulin aspart  0-9 Units Subcutaneous TID WC  . metroNIDAZOLE  500 mg Oral Q8H    Recommendations: Bactrim desensitization protocol and home on 1 DS Bactrim bid until stone removed + 1 week Have NH clarify all allergies after discharge  Assessment: In regards to his pyelonephritis and renal stone, he did have purulence in urine, stone remains in place for now and E coli in urine and blood.  Ideally he will continue with suppressive therapy with Bactrim until definitive stone removal done and continue for another 1-2 weeks to assure clearance.  Pharmacy to put in desensitization protocol and pt will be able to stay on Bactrim for the duration (when it is stopped, he would need to restart desensitization).       For C diff, I would continue with oral metronidazole while on antibiotics and then for 2 weeks after completion of antibiotics.  If WBC increase persists, consider oral vancomycin for C diff.    Antibiotics: Tamiflu Oral metronidazole Aztreonam  HPI: Henry Wyatt is  a 73 y.o. male NH resident, came in 12/31 with fever, AMS and found to be septic.  Blood and urine culture grew E coli and noted to have renal stone.  He was managed by urology with decompression but stone remains in place for now and will get definitive stone removal in 1-2 weeks.  He also was noted to be positive for flu and C diff.  His respiratory distress has improved and C diff improved.  WBC has increased to 15.1 but remains afebrile, BP stable.     Review of Systems: pt not answering  Past Medical History  Diagnosis Date  . Hypertension   . Diabetes mellitus without complication   . Constipation   . DVT (deep venous thrombosis)   . Back pain   . Kidney injury     History  Substance Use Topics  . Smoking status: Former Games developer  . Smokeless tobacco: Not on file  . Alcohol Use: No    No family history on file. Allergies  Allergen Reactions  . Carbapenems Other (See Comments)    Per nursing home [facility]  . Cephalosporins Other (See Comments)    Per nursing home [facility]  . Erythromycin Other (See Comments)    Per nursing home [facility]  . Penicillins Other (See Comments)    Per nursing home [facility]  . Sulfa Antibiotics Other (See Comments)    Per nursing home [facility]  OBJECTIVE: Blood pressure 147/81, pulse 76, temperature 98.7 F (37.1 C), temperature source Oral, resp. rate 18, height 6\' 1"  (1.854 m), weight 224 lb 13.9 oz (102 kg), SpO2 93.00%. General: sleeping, arousable Skin: no rashes Lungs: CTA B Cor: RRR Abdomen: soft, nt, nd Ext: no edema  Microbiology: Recent Results (from the past 240 hour(s))  CLOSTRIDIUM DIFFICILE BY PCR     Status: Abnormal   Collection Time    01/05/13  5:25 AM      Result Value Range Status   C difficile by pcr POSITIVE (*) NEGATIVE Final   Comment: CRITICAL RESULT CALLED TO, READ BACK BY AND VERIFIED WITH:     SHEPHERD RN 9:35 01/05/13 ([email or web address])     Performed [facility]  MRSA PCR SCREENING     Status: None   Collection Time     01/05/13  9:06 AM      Result Value Range Status   MRSA by PCR NEGATIVE  NEGATIVE Final   Comment:            The GeneXpert MRSA Assay (FDA     approved for NASAL specimens     only), is one component of a     comprehensive MRSA colonization     surveillance program. It is not     intended to diagnose MRSA     infection nor to guide or     monitor treatment for     MRSA infections.  CULTURE, BLOOD (ROUTINE X 2)     Status: None   Collection Time    01/05/13  9:30 AM      Result Value Range Status   Specimen Description BLOOD LEFT ARM   Final   Special Requests BOTTLES DRAWN AEROBIC AND ANAEROBIC 10CC   Final   Culture  Setup Time     Final   Value: 01/05/2013 14:04     [facility]formed at [facility]   Culture     Final   Value: ESCHERICHIA COLI     Note: Gram Stain Report Called to,Read Back By and Verified With: Lezlie LyeLISA FREI [id number] BRMEL     Performed at [facility]   Report Status 01/08/2013 FINAL   Final   Organism ID, Bacteria ESCHERICHIA COLI   Final  CULTURE, BLOOD (ROUTINE X 2)     Status: None   Collection Time    01/05/13  9:35 AM      Result Value Range Status   Specimen Description BLOOD LEFT HAND   Final   Special Requests BOTTLES DRAWN AEROBIC ONLY 5CC   Final   Culture  Setup Time     Final   Value: 01/05/2013 14:04     Performed at [facility]   Culture     Final   Value: NO GROWTH 5 DAYS     Performed at [facility]   Report Status 01/11/2013 FINAL   Final  URINE CULTURE     Status: None   Collection Time    01/05/13 10:27 AM      Result Value Range Status   Specimen Description URINE, CATHETERIZED   Final   Special Requests NONE   Final   Culture  Setup Time     Final   Value: 01/05/2013 14:03     Performed at [facility]   Colony Count     Final   Value: >=100,000 COLONIES/ML     Performed at [facility]  Culture     Final   Value: ESCHERICHIA COLI     Performed at [facility]    Report Status 01/09/2013 FINAL   Final   Organism ID, Bacteria ESCHERICHIA COLI   Final    Staci Righter, MD [email or web address] [phone] pager  [phone] cell 01/12/2013, 10:52 AM

## 2013-01-12 NOTE — Progress Notes (Signed)
MEDICATION RELATED CONSULT NOTE - INITIAL   Pharmacy Consult for Desensitization to Bactrim Indication: acute pyelonephritis w/bacteremia in setting of nephrolithasis  Allergies  Allergen Reactions  . Carbapenems Other (See Comments)    Per nursing home [facility]  . Cephalosporins Other (See Comments)    Per nursing home [facility]  . Erythromycin Other (See Comments)    Per nursing home [facility]  . Penicillins Other (See Comments)    Per nursing home [facility]  . Sulfa Antibiotics Other (See Comments)    Per nursing home [facility]    Patient Measurements: Height: 6\' 1"  (185.4 cm) Weight: 224 lb 13.9 oz (102 kg) IBW/kg (Calculated) : 79.9   Vital Signs: Temp: 98.7 F (37.1 C) (01/07 0554) Temp src: Oral (01/07 0554) BP: 147/81 mmHg (01/07 0554) Pulse Rate: 76 (01/07 0554) Intake/Output from previous day: 01/06 0701 - 01/07 0700 In: 560 [P.O.:560] Out: 3575 [Urine:3575] Intake/Output from this shift: Total I/O In: 300 [IV Piggyback:300] Out: 900 [Urine:900]  Labs:  Recent Labs  01/10/13 0328 01/12/13 0520  WBC 10.1 15.1*  HGB 10.4* 10.7*  HCT 31.0* 32.5*  PLT 211 324  CREATININE 1.52* 1.39*   Estimated Creatinine Clearance: 59.4 ml/min (by C-G formula based on Cr of 1.39).   Microbiology: Recent Results (from the past 720 hour(s))  CLOSTRIDIUM DIFFICILE BY PCR     Status: Abnormal   Collection Time    01/05/13  5:25 AM      Result Value Range Status   C difficile by pcr POSITIVE (*) NEGATIVE Final   Comment: CRITICAL RESULT CALLED TO, READ BACK BY AND VERIFIED WITH:     SHEPHERD RN 9:35 01/05/13 (wilsonm)     Performed at [facility]  MRSA PCR SCREENING     Status: None   Collection Time    01/05/13  9:06 AM      Result Value Range Status   MRSA by PCR NEGATIVE  NEGATIVE Final   Comment:            The GeneXpert MRSA Assay (FDA     approved for NASAL specimens     only), is one component of a     comprehensive MRSA colonization     surveillance program. It is not      intended to diagnose MRSA     infection nor to guide or     monitor treatment for     MRSA infections.  CULTURE, BLOOD (ROUTINE X 2)     Status: None   Collection Time    01/05/13  9:30 AM      Result Value Range Status   Specimen Description BLOOD LEFT ARM   Final   Special Requests BOTTLES DRAWN AEROBIC AND ANAEROBIC 10CC   Final   Culture  Setup Time     Final   Value: 01/05/2013 14:04     Performed at [facility]   Culture     Final   Value: ESCHERICHIA COLI     Note: Gram Stain Report Called to,Read Back By and Verified With: Lezlie Lye [id number] BRMEL     Performed at [facility]   Report Status 01/08/2013 FINAL   Final   Organism ID, Bacteria ESCHERICHIA COLI   Final  CULTURE, BLOOD (ROUTINE X 2)     Status: None   Collection Time    01/05/13  9:35 AM      Result Value Range Status   Specimen Description BLOOD LEFT HAND  Final   Special Requests BOTTLES DRAWN AEROBIC ONLY 5CC   Final   Culture  Setup Time     Final   Value: 01/05/2013 14:04     Performed at [facility]   Culture     Final   Value: NO GROWTH 5 DAYS     Performed at [facility]   Report Status 01/11/2013 FINAL   Final  URINE CULTURE     Status: None   Collection Time    01/05/13 10:27 AM      Result Value Range Status   Specimen Description URINE, CATHETERIZED   Final   Special Requests NONE   Final   Culture  Setup Time     Final   Value: 01/05/2013 14:03     Performed at [facility]   Colony Count     Final   Value: >=100,000 COLONIES/ML     Performed at [facility]   Culture     Final   Value: ESCHERICHIA COLI     Performed at [facility]   Report Status 01/09/2013 FINAL   Final   Organism ID, Bacteria ESCHERICHIA COLI   Final    Medical History: Past Medical History  Diagnosis Date  . Hypertension   . Diabetes mellitus without complication   . Constipation   . DVT (deep venous thrombosis)   . Back pain   .  Kidney injury     Medications:  Scheduled:  . antiseptic oral rinse  15 mL Mouth Rinse q12n4p  . apixaban  5 mg Oral BID  . aztreonam  1 g Intravenous Q8H  . Bactrim Oral Suspension 1:10 Dilution,  0.5 mL in 30 mL Water  30 mL Oral Once  . Bactrim Oral Suspension 1:100 Dilution, 0.5 mL in 30 mL Water  30 mL Oral Once  . Bactrim Oral Suspension 1:1000 Dilution,  0.5 mL in 30 mL Water  30 mL Oral Once  . Bactrim Oral Suspension, 0.5 mL in 30 mL Water  30 mL Oral Once  . Bactrim Oral Suspension  5 mL Oral Once  . chlorhexidine  15 mL Mouth Rinse BID  . feeding supplement (RESOURCE BREEZE)  1 Container Oral BID BM  . furosemide  20 mg Intravenous Q12H  . insulin aspart  0-5 Units Subcutaneous QHS  . insulin aspart  0-9 Units Subcutaneous TID WC  . metroNIDAZOLE  500 mg Oral Q8H  . sulfamethoxazole-trimethoprim  20 mL Oral Once   Infusions:   PRN: albuterol, morphine injection, ondansetron (ZOFRAN) IV, oxyCODONE, sodium chloride  Assessment: 73 y/o M with acute pyelonephritis w/bacteremia in setting of nephrolithasis.  Prior records list allergies to multiple antibiotic classes including penicillins, cephalosporins, carbapenems, erythromycin, sulfonamides, but reaction to these agents is uncertain.    Blood and urine cultures grew E.Coli.  Patient responded to inpatient antibiotic therapy with aztreonam, but this is not available in an oral dosage form.  Request received from Dr. Luciana Axeomer to proceed with desensitization to Bactrim to allow follow-up therapy with this antimicrobial.  Will plan to use the Oral Bactrim Desensitization Protocol listed in the [facility] Guide to Antimicrobial Therapy, 44th edition (2014), which involves beginning with 0.5 mL of a 1:1000 dilution of the oral suspension administered orally, then administering progressively larger doses every hour if tolerated.   Reviewed the protocol with Dr. Luciana Axeomer and orders received to proceed.  Goal of Therapy:  Successful  desensitization to Bactrim  Plan:  1.  Bactrim  Oral Suspension 1:1000 Dilution,  0.5 mL in 30 mL water PO x 1.   If no reaction, 1 hr later proceed to . . . 2.  Bactrim Oral Suspension 1:100 Dilution, 0.5 mL in 30 mL water PO  x 1.  If no reaction, 1 hr later proceed to . . . 3.  Bactrim Oral Suspension 1:10 Dilution, 0.5 mL in 30 mL water PO x 1.  If no reaction, 1 hr later proceed to . . . 4.  Bactrim Oral Suspension 0.5 mL in 30 mL water PO x 1.  If no reaction, 1 hr later proceed to . . . 5.  Bactrim Oral Suspension 5 mL undiluted PO x 1.  If no reaction, 1 hr later proceed to . . . 6.  Bactrim Oral Suspension 20 mL undiluted PO x 1.  Contact Dr. Luciana Axe if patient exhibits a reaction during the desensitization process.  Elie Goody, PharmD, BCPS Pager: [phone] 01/12/2013  12:00 PM

## 2013-01-12 NOTE — Progress Notes (Signed)
[facility] PROGRESS NOTE  Henry Wyatt WUJ:[id number] DOB: 12/30/1940 DOA: 01/05/2013 PCP: Ginette Otto, MD  Assessment/Plan: Principal Problem:   Septic shock(785.52) (2ary to urinary source) - Resolved.  Active Problems:   Diarrhea -  Most likely due to C. diffic. Was on oral vancomycin and metronidazole. - will discontinue oral vancomycin but should avoid blood cell count trend up next a.m. we'll continue oral vancomycin.    HCAP (healthcare-associated pneumonia) - Suspected it initially but most likely not cause of respiratory distress. Chest x-ray is not convincing for infiltrates or consolidations and patient had elevated BNP. We'll discontinue Levaquin    Renal stone/pyelonephritis - Please see notes indicated below and urology consult notes. Patient will followup with urology after discharge - Consulted infectious disease and currently plan is for Bactrim on desensitization protocol    Hydronephrosis of left kidney - Patient is status post JJ stent placement. Urology on board    Altered mental status    DM (diabetes mellitus), type 2 with renal complications - Continue sliding scale insulin at this point    AKI (acute kidney injury) - As listed below serum creatinine continues to improve    CKD (chronic kidney disease) - Currently improving with improved by mouth intake and after JJ stent placement described below and the neurology note    Influenza A - Initial problem which was treated with Tamiflu and patient finished course 01/07/2013    Acute pyelonephritis - Patient is status post JJ stent placement. Urine culture growing Escherichia coli.  - We'll follow recommendations as outlined by infectious disease please see above    Acute combined systolic and diastolic congestive heart failure - While in house was diuresed with Lasix and currently  respiratory condition much improved per patient. Most likely cause of shortness of breath    Other  dysphagia - Currently tolerating full liquid diet, will place order for speech therapy evaluation for bedside swallow eval  Code Status: DNR Family Communication: No family at bedside to discuss results. Discussed directly with patient.  Disposition Plan: With improvement in oral intake and leukocytosis back to skilled nursing facility   Consultants:  Urology   ID  Procedures:  As mentioned above    HPI/Subjective: No new problems reported overnight. Patient was inquiring about discharge plans.  Objective: Filed Vitals:   01/12/13 1400  BP: 148/82  Pulse: 82  Temp: 98.9 F (37.2 C)  Resp: 18    Intake/Output Summary (Last 24 hours) at 01/12/13 1748 Last data filed at 01/12/13 1400  Gross per 24 hour  Intake    830 ml  Output   3475 ml  Net  -2645 ml   Filed Weights   01/05/13 0857 01/06/13 0530 01/07/13 0400  Weight: 102.1 kg (225 lb 1.4 oz) 104 kg (229 lb 4.5 oz) 102 kg (224 lb 13.9 oz)    Exam:   General:  Pt in NAD, alert and awake  Cardiovascular: RRR, no MRG  Respiratory: no wheezes, no rhales  Abdomen: soft, NT, ND  Musculoskeletal: no cyanosis or clubbing   Data Reviewed: Basic Metabolic Panel:  Recent Labs Lab 01/07/13 0430 01/08/13 0700 01/09/13 0503 01/10/13 0328 01/12/13 0520  NA 139 141 139 139 138  K 4.0 3.6* 3.6* 3.4* 3.1*  CL 105 106 104 102 100  CO2 20 22 22 23 24   GLUCOSE 97 178* 152* 201* 152*  BUN 63* 55* 47* 37* 25*  CREATININE 2.65* 2.13* 1.77* 1.52* 1.39*  CALCIUM 8.5 8.5 8.6 8.7 8.6  Liver Function Tests:  Recent Labs Lab 01/08/13 0700  AST 27  ALT 33  ALKPHOS 85  BILITOT 0.4  PROT 5.8*  ALBUMIN 2.1*   No results found for this basename: LIPASE, AMYLASE,  in the last 168 hours No results found for this basename: AMMONIA,  in the last 168 hours CBC:  Recent Labs Lab 01/06/13 0430  01/07/13 0430 01/08/13 0700 01/09/13 0503 01/10/13 0328 01/12/13 0520  WBC 22.1*  < > 21.1* 12.7* 10.5 10.1 15.1*   NEUTROABS 20.5*  --   --   --   --   --   --   HGB 9.6*  < > 9.9* 9.4* 9.9* 10.4* 10.7*  HCT 29.2*  < > 28.9* 29.2* 30.3* 31.0* 32.5*  MCV 88.0  < > 86.8 88.0 87.3 86.1 87.1  PLT 88*  < > 116* 103* 151 211 324  < > = values in this interval not displayed. Cardiac Enzymes:  Recent Labs Lab 01/10/13 0231 01/10/13 0850 01/10/13 1505  CKTOTAL 22  --   --   CKMB 1.4  --   --   TROPONINI <0.30 <0.30 <0.30   BNP (last 3 results)  Recent Labs  01/05/13 1600 01/09/13 0503  PROBNP 8741.0* 6577.0*   CBG:  Recent Labs Lab 01/11/13 1657 01/11/13 2202 01/12/13 0734 01/12/13 1214 01/12/13 1648  GLUCAP 166* 159* 187* 243* 191*    Recent Results (from the past 240 hour(s))  CLOSTRIDIUM DIFFICILE BY PCR     Status: Abnormal   Collection Time    01/05/13  5:25 AM      Result Value Range Status   C difficile by pcr POSITIVE (*) NEGATIVE Final   Comment: CRITICAL RESULT CALLED TO, READ BACK BY AND VERIFIED WITH:     SHEPHERD RN 9:35 01/05/13 ([email or web address])     [facility]  MRSA PCR SCREENING     Status: None   Collection Time    01/05/13  9:06 AM      Result Value Range Status   MRSA by PCR NEGATIVE  NEGATIVE Final   Comment:            The GeneXpert MRSA Assay (FDA     approved for NASAL specimens     only), is one component of a     comprehensive MRSA colonization     surveillance program. It is not     intended to diagnose MRSA     infection nor to guide or     monitor treatment for     MRSA infections.  CULTURE, BLOOD (ROUTINE X 2)     Status: None   Collection Time    01/05/13  9:30 AM      Result Value Range Status   Specimen Description BLOOD LEFT ARM   Final   Special Requests BOTTLES DRAWN AEROBIC AND ANAEROBIC 10CC   Final   Culture  Setup Time     Final   Value: 01/05/2013 14:04     Performed at [facility]   Culture     Final   Value: ESCHERICHIA COLI     Note: Gram Stain Report Called to,Read Back By and Verified With: Lezlie LyeLISA  FREI [id number] BRMEL     Performed at [facility]   Report Status 01/08/2013 FINAL   Final   Organism ID, Bacteria ESCHERICHIA COLI   Final  CULTURE, BLOOD (ROUTINE X 2)     Status: None   Collection Time  01/05/13  9:35 AM      Result Value Range Status   Specimen Description BLOOD LEFT HAND   Final   Special Requests BOTTLES DRAWN AEROBIC ONLY 5CC   Final   Culture  Setup Time     Final   Value: 01/05/2013 14:04     Performed at [facility]   Culture     Final   Value: NO GROWTH 5 DAYS     Performed at [facility]   Report Status 01/11/2013 FINAL   Final  URINE CULTURE     Status: None   Collection Time    01/05/13 10:27 AM      Result Value Range Status   Specimen Description URINE, CATHETERIZED   Final   Special Requests NONE   Final   Culture  Setup Time     Final   Value: 01/05/2013 14:03     Performed at [facility] Count     Final   Value: >=100,000 COLONIES/ML     Performed at [facility]   Culture     Final   Value: ESCHERICHIA COLI     Performed at [facility]   Report Status 01/09/2013 FINAL   Final   Organism ID, Bacteria ESCHERICHIA COLI   Final     Studies: No results found.  Scheduled Meds: . antiseptic oral rinse  15 mL Mouth Rinse q12n4p  . apixaban  5 mg Oral BID  . aztreonam  1 g Intravenous Q8H  . chlorhexidine  15 mL Mouth Rinse BID  . feeding supplement (RESOURCE BREEZE)  1 Container Oral BID BM  . furosemide  20 mg Intravenous Q12H  . insulin aspart  0-5 Units Subcutaneous QHS  . insulin aspart  0-9 Units Subcutaneous TID WC  . metroNIDAZOLE  500 mg Oral Q8H  . sulfamethoxazole-trimethoprim  20 mL Oral Once   Continuous Infusions:   Principal Problem:   Septic shock(785.52) Active Problems:   Severe sepsis(995.92)   Diarrhea   HCAP (healthcare-associated pneumonia)   Renal stone   Hydronephrosis of left kidney   Altered mental status   DM (diabetes mellitus),  type 2 with renal complications   AKI (acute kidney injury)   CKD (chronic kidney disease)   C. difficile diarrhea   Influenza A   Acute pyelonephritis   Acute combined systolic and diastolic congestive heart failure   Other dysphagia    Time spent: > 35 minutes    Penny Pia  [facility] Pager [phone]. If 7PM-7AM, please contact night-coverage at [email or web address], password [facility] 01/12/2013, 5:48 PM  LOS: 7 days

## 2013-01-13 ENCOUNTER — Other Ambulatory Visit: Payer: Self-pay | Admitting: Urology

## 2013-01-13 LAB — CBC
HEMATOCRIT: 32.6 % — AB (ref 39.0–52.0)
HEMOGLOBIN: 10.7 g/dL — AB (ref 13.0–17.0)
MCH: 28.5 pg (ref 26.0–34.0)
MCHC: 32.8 g/dL (ref 30.0–36.0)
MCV: 86.7 fL (ref 78.0–100.0)
Platelets: 367 10*3/uL (ref 150–400)
RBC: 3.76 MIL/uL — ABNORMAL LOW (ref 4.22–5.81)
RDW: 15.7 % — ABNORMAL HIGH (ref 11.5–15.5)
WBC: 14.6 10*3/uL — AB (ref 4.0–10.5)

## 2013-01-13 LAB — GLUCOSE, CAPILLARY
Glucose-Capillary: 125 mg/dL — ABNORMAL HIGH (ref 70–99)
Glucose-Capillary: 230 mg/dL — ABNORMAL HIGH (ref 70–99)
Glucose-Capillary: 188 mg/dL — ABNORMAL HIGH (ref 70–99)

## 2013-01-13 MED ORDER — POTASSIUM CHLORIDE CRYS ER 20 MEQ PO TBCR
40.0000 meq | EXTENDED_RELEASE_TABLET | Freq: Once | ORAL | Status: AC
  Administered 2013-01-13: 40 meq via ORAL
  Filled 2013-01-13: qty 2

## 2013-01-13 MED ORDER — OXYCODONE HCL 5 MG PO TABS: 5.0000 mg | ORAL_TABLET | Freq: Four times a day (QID) | ORAL | Status: DC | PRN

## 2013-01-13 MED ORDER — FUROSEMIDE 40 MG PO TABS: 40.0000 mg | ORAL_TABLET | Freq: Every day | ORAL | Status: DC

## 2013-01-13 MED ORDER — METOPROLOL SUCCINATE ER 25 MG PO TB24: 12.5000 mg | ORAL_TABLET | Freq: Every day | ORAL | Status: DC

## 2013-01-13 MED ORDER — ENSURE COMPLETE PO LIQD: 237.0000 mL | Freq: Two times a day (BID) | ORAL | Status: DC

## 2013-01-13 MED ORDER — BOOST / RESOURCE BREEZE PO LIQD: 1.0000 | Freq: Every day | ORAL | Status: DC

## 2013-01-13 MED ORDER — METRONIDAZOLE 500 MG PO TABS: 500.0000 mg | ORAL_TABLET | Freq: Three times a day (TID) | ORAL | Status: DC

## 2013-01-13 MED ORDER — SULFAMETHOXAZOLE-TMP DS 800-160 MG PO TABS
1.0000 | ORAL_TABLET | Freq: Two times a day (BID) | ORAL | Status: DC
  Administered 2013-01-13: 1 via ORAL
  Filled 2013-01-13 (×2): qty 1

## 2013-01-13 MED ORDER — ENSURE COMPLETE PO LIQD
237.0000 mL | Freq: Two times a day (BID) | ORAL | Status: DC
  Administered 2013-01-13: 237 mL via ORAL

## 2013-01-13 MED ORDER — SULFAMETHOXAZOLE-TMP DS 800-160 MG PO TABS: 1.0000 | ORAL_TABLET | Freq: Two times a day (BID) | ORAL | Status: DC

## 2013-01-13 NOTE — Progress Notes (Signed)
NUTRITION FOLLOW UP  Intervention:   - Diet advancement per MD - Will change Resource Breeze to once daily and add Ensure Complete BID - Recommend adding Florastor to help with diarrhea - Will continue to monitor   Nutrition Dx:   Inadequate oral intake related to clear liquid diet as evidenced by diet order - no longer appropriate, diet advanced  New nutrition dx: Altered GI function related to diarrhea as evidenced by MD notes.   Goal:   Advance diet as tolerated to diabetic diet - not met   New goals: 1. Pt to consume >90% of meals/supplements 2. Resolution of diarrhea   Monitor:   Weights, labs, intake, diarrhea   Assessment:   Pt is a nursing home resident, with history of hypertension and diabetes, sent for evaluation for fever and change in mental status. Patient is confused but he was noted to have diarrhea which he has not had any today per RN. Found to have C. Difficile and positive flu screen influenza A. Pt with urosepsis and midureteral stone And had cytoscopy with left retrograde pyelogram and left double J stent 12/31.   1/2 - Pt on clear liquid diet which he reports tolerating well, requested Ginger-ale which was provided. Pt reports he was not eating anything for a few days PTA. Pt thinks he has lost weight but is unsure how much.   1/8 - Reviewed events since last RD visit. Had SLP evaluation 1/2 with recommendations for dysphagia 3/thin. PO intake has been 50% of meals. RN reports pt has been drinking the [employer], 1 per day. Pt confused. Had some loose stools this morning.   Height: Ht Readings from Last 1 Encounters:  01/05/13 '6\' 1"'  (1.854 m)    Weight Status:   Wt Readings from Last 1 Encounters:  01/07/13 224 lb 13.9 oz (102 kg)  Admit wt:        225 lb 1.4 oz (102.1 kg)  Re-estimated needs:  Kcal: 2300-2500  Protein: 100-125g  Fluid: 2.3-2.5L/day   Skin: +3 RUE edema, +2 RLE and LLE edema, non-pitting LUE edema, +1 generalized edema,  stage 2 coccyx pressure ulcer  Diet Order: Full Liquid   Intake/Output Summary (Last 24 hours) at 01/13/13 1036 Last data filed at 01/13/13 0603  Gross per 24 hour  Intake   1350 ml  Output   2050 ml  Net   -700 ml    Last BM: 1/8   Labs:   Recent Labs Lab 01/09/13 0503 01/10/13 0328 01/12/13 0520  NA 139 139 138  K 3.6* 3.4* 3.1*  CL 104 102 100  CO2 '22 23 24  ' BUN 47* 37* 25*  CREATININE 1.77* 1.52* 1.39*  CALCIUM 8.6 8.7 8.6  GLUCOSE 152* 201* 152*    CBG (last 3)   Recent Labs  01/12/13 1648 01/12/13 2249 01/13/13 0731  GLUCAP 191* 148* 125*    Scheduled Meds: . antiseptic oral rinse  15 mL Mouth Rinse q12n4p  . apixaban  5 mg Oral BID  . aztreonam  1 g Intravenous Q8H  . chlorhexidine  15 mL Mouth Rinse BID  . feeding supplement (RESOURCE BREEZE)  1 Container Oral BID BM  . furosemide  20 mg Intravenous Q12H  . insulin aspart  0-5 Units Subcutaneous QHS  . insulin aspart  0-9 Units Subcutaneous TID WC  . metroNIDAZOLE  500 mg Oral Q8H  . sulfamethoxazole-trimethoprim  1 tablet Oral Q12H     Mikey College MS, RD, LDN [phone] Pager  [phone] After Hours Pager

## 2013-01-13 NOTE — Discharge Summary (Signed)
Physician Discharge Summary  Henry Wyatt ZOX:[id number] DOB: Jul 22, 1940 DOA: 01/05/2013  PCP: Ginette Otto, MD  Admit date: 01/05/2013 Discharge date: 01/13/2013  Time spent: > 35 minutes  Recommendations for Outpatient Follow-up:  1. Patient is to follow up with urologist for stone removal. Please refer to recommendations made by infectious disease Dr. listed below regarding antibiotic usage 2. Also is to follow up with primary care physician for continuation of home regimen which may have been discontinued on the hospital secondary to his initial condition when hospitalized 3. Patient had an EF of 35% and will be started on Lasix 40 mg daily 4. Please have nursing home clarify drug allergies especially to antibiotics  Discharge Diagnoses:  Principal Problem:   Septic shock(785.52) Active Problems:   Severe sepsis(995.92)   Diarrhea   HCAP (healthcare-associated pneumonia)   Renal stone   Hydronephrosis of left kidney   Altered mental status   DM (diabetes mellitus), type 2 with renal complications   AKI (acute kidney injury)   CKD (chronic kidney disease)   C. difficile diarrhea   Influenza A   Acute pyelonephritis   Acute combined systolic and diastolic congestive heart failure   Other dysphagia   Hypokalemia   Discharge Condition: Stable  Diet recommendation: Dysphagia 1 with thin liquids, Low sodium heart healthy  Filed Weights   01/05/13 0857 01/06/13 0530 01/07/13 0400  Weight: 102.1 kg (225 lb 1.4 oz) 104 kg (229 lb 4.5 oz) 102 kg (224 lb 13.9 oz)    History of present illness:  The patient is a 73 year old with history of dementia, diabetes, and DVT, and diarrhea who presented to the ED in septic shock and initially had to be placed in ICU. Was treated with Tamiflu since patient was flu PCR positive initially. And found to have pyelonephritis with nephrolithiasis. Urology consult at and placed [facility] stent  Hospital Course:  Principal Problem:  Septic  shock(785.52) (2ary to urinary source)  - Resolved.   Active Problems:  Diarrhea  - Most likely due to C. diffic. Was on oral vancomycin and metronidazole.  - will discontinue oral vancomycin but should avoid blood cell count trend up next a.m. we'll continue oral vancomycin.  Was evaluated by infectious disease doctor recommended the following: For C diff, I would continue with oral metronidazole while on antibiotics and then for 2 weeks after completion of antibiotics. If WBC increase persists, consider oral vancomycin for C diff.    HCAP (healthcare-associated pneumonia)  - Suspected it initially but most likely not cause of respiratory distress. Chest x-ray is not convincing for infiltrates or consolidations and patient had elevated BNP. We'll discontinue Levaquin   Renal stone/pyelonephritis  -  Patient will followup with urology after discharge  - Infectious disease consult at and recommended the following: Bactrim desensitization protocol and home on 1 DS Bactrim bid until stone removed + 1 week  Have NH clarify all allergies after discharge   Hydronephrosis of left kidney  - Patient is status post JJ stent placement. Will need to followup with urologist on discharge. We'll have secretary set up followup appointment  Altered mental status   DM (diabetes mellitus), type 2 with renal complications  - Will discharge patient home diabetic regimen - Patient to continue diabetic diet - to continue to monitor blood sugars at least twice a day fasting and postprandial   AKI (acute kidney injury)  -  was most likely secondary to sepsis will recommend patient continue to get this monitor as outpatient will  hold ARB on discharge  CKD (chronic kidney disease)  - Currently improving with improved by mouth intake and after JJ stent placement described below and the neurology note   Influenza A  - Initial problem which was treated with Tamiflu and patient finished course 01/07/2013    Acute pyelonephritis  - Patient is status post JJ stent placement. Urine culture growing Escherichia coli.  - We'll follow recommendations as outlined by infectious disease please see above   Acute combined systolic and diastolic congestive heart failure  - Will continue beta blocker at lower dose on discharge. Also continue Lasix 40 mg by mouth daily. Will hold ARB given serum creatinine.  Other dysphagia  - Was evaluated by speech therapy who recommended dysphagia 1 with thin liquids.  Procedures:  JJ stent placement  Central line placement  Consultations:  PC CM initially  Urology  Infectious disease  Discharge Exam: Filed Vitals:   01/13/13 0602  BP: 144/79  Pulse: 76  Temp: 98.8 F (37.1 C)  Resp: 18    General: Pt in NAD, Alert and awake Cardiovascular: RRR, no MRG Respiratory: CTA BL, no wheezes  Discharge Instructions  Discharge Orders   Future Orders Complete By Expires   Call MD for:  extreme fatigue  As directed    Call MD for:  persistant dizziness or light-headedness  As directed    Call MD for:  severe uncontrolled pain  As directed    Call MD for:  temperature >100.4  As directed    Diet - low sodium heart healthy  As directed    Discharge instructions  As directed    Comments:     Patient will need to follow up with urologist within the next one to 2 weeks. Also will need to follow up with primary care physician at skilled nursing facility for further evaluation and recommendations. Please see recommendations from infectious disease doctor on length of antibiotic therapy   Increase activity slowly  As directed        Medication List    STOP taking these medications       atorvastatin 20 MG tablet  Commonly known as:  LIPITOR     divalproex 125 MG DR tablet  Commonly known as:  DEPAKOTE     ondansetron 4 MG tablet  Commonly known as:  ZOFRAN     pantoprazole 40 MG tablet  Commonly known as:  PROTONIX     polyethylene glycol  packet  Commonly known as:  MIRALAX / GLYCOLAX     valsartan 320 MG tablet  Commonly known as:  DIOVAN      TAKE these medications       acetaminophen 500 MG tablet  Commonly known as:  TYLENOL  Take 1,000 mg by mouth every 8 (eight) hours as needed for mild pain.     albuterol (2.5 MG/3ML) 0.083% nebulizer solution  Commonly known as:  PROVENTIL  Take 2.5 mg by nebulization every 2 (two) hours as needed for wheezing or shortness of breath.     citalopram 10 MG tablet  Commonly known as:  CELEXA  Take 10 mg by mouth daily.     cyanocobalamin 1000 MCG tablet  Take 100 mcg by mouth daily.     docusate sodium 100 MG capsule  Commonly known as:  COLACE  Take 100 mg by mouth 2 (two) times daily.     ELIQUIS 5 MG Tabs tablet  Generic drug:  apixaban  Take 5 mg by mouth 2 (  two) times daily.     feeding supplement (ENSURE COMPLETE) Liqd  Take 237 mLs by mouth 2 (two) times daily between meals.     folic acid 1 MG tablet  Commonly known as:  FOLVITE  Take 1 mg by mouth daily.     insulin aspart 100 UNIT/ML injection  Commonly known as:  novoLOG  Inject 1-12 Units into the skin 3 (three) times daily before meals. Per sliding scale     metoprolol succinate 25 MG 24 hr tablet  Commonly known as:  TOPROL-XL  Take 0.5 tablets (12.5 mg total) by mouth at bedtime.     metroNIDAZOLE 500 MG tablet  Commonly known as:  FLAGYL  Take 1 tablet (500 mg total) by mouth every 8 (eight) hours.     oxyCODONE 5 MG immediate release tablet  Commonly known as:  Oxy IR/ROXICODONE  Take 5 mg by mouth every 6 (six) hours as needed for severe pain.     sulfamethoxazole-trimethoprim 800-160 MG per tablet  Commonly known as:  BACTRIM DS  Take 1 tablet by mouth every 12 (twelve) hours.  Lasix 40 mg po daily       Allergies  Allergen Reactions  . Carbapenems Other (See Comments)    Per nursing home mar  . Cephalosporins Other (See Comments)    Per nursing home mar  . Erythromycin Other  (See Comments)    Per nursing home mar  . Penicillins Other (See Comments)    Per nursing home mar  . Sulfa Antibiotics Other (See Comments)    Per nursing home mar      The results of significant diagnostics from this hospitalization (including imaging, microbiology, ancillary and laboratory) are listed below for reference.    Significant Diagnostic Studies: Ct Abdomen Pelvis Wo Contrast  01/05/2013   CLINICAL DATA:  73 year old male with fever and abdominal pain. Renal insufficiency precludes IV contrast. Initial encounter.  EXAM: CT ABDOMEN AND PELVIS WITHOUT CONTRAST  TECHNIQUE: Multidetector CT imaging of the abdomen and pelvis was performed following the standard protocol without intravenous contrast.  COMPARISON:  Portable abdomen radiographs from the same day.  FINDINGS: Mild motion artifact. Confluent left lower lobe and posterior lingula pulmonary opacity, somewhat beyond that typically seen for simple atelectasis. Cardiomegaly. No pericardial or pleural effusion. Mild atelectasis at the right lung base.  Chronic appearing right lower posterior rib fractures. Scoliosis and advanced degenerative changes in the spine. Previous lumbosacral junction posterior and interbody fusion. Previous partial laminectomy in the upper lumbar spine. No acute osseous abnormality identified.  Mild presacral stranding (series 2, image 77) contiguous with inflammation in the retroperitoneal space along the left pelvic sidewall tracking to the lower abdomen. Inflammation tracks along the left gutter and there is thickening of the left para renal fascia.  There is in a regular 7 x 7 mm obstructing calculus in the left ureter at the L4-L5 disc level. There is upstream hydroureter and periureteral stranding. There is left hydronephrosis with a subtle fluid fluid level in the left hemipelvis (series 2, image 40). There are additional nonobstructing left intra renal calculi. Left perinephric stranding is moderate.   Beyond the obstruction, the distal left ureter appears normal. The bladder is within normal limits. Right nephrolithiasis without obstruction.  There is mild wall thickening of the distal colon, most pronounced in the sigmoid. There are sigmoid diverticula. Still, this segment is under distended. The left colon is also decompressed with diverticula.  More normal appearance of the transverse colon and  right: . Appendix appears to be surgically absent. No dilated small bowel. Negative stomach and duodenum. Negative non contrast liver, gallbladder, spleen, pancreas and right adrenal gland. There is a 3 cm left adrenal low density nodule (12 Hounsfield units). No abdominal free fluid or free air.  IMPRESSION: 1. Acute obstructive uropathy on the left, with a 7 x 7 mm calculus at the L4-L5 disc level. Moderate to severe perinephric and periureteric stranding plus a fluid-fluid level in the left renal pelvis (may reflect hematuria).  2. Stranding along the left pericolic gutter and retroperitoneal space tracking to the presacral space all felt to be related to #1. There is diverticulosis of the sigmoid and descending colon, but I do not favor superimposed colitis at this time.  3. Confluent opacity at the left lung base, favor atelectasis in light of #1, but developing left lung base infection difficult to exclude.  4.  Additional bilateral nephrolithiasis.  5. Low-density 3 cm left adrenal nodule, favor benign adenoma.   Electronically Signed   By: Augusto Gamble M.D.   On: 01/05/2013 08:56   Dg Chest 1 View  01/06/2013   CLINICAL DATA:  Wheezing and shortness of breath.  EXAM: CHEST - 1 VIEW  COMPARISON:  01/07/2003 at 0514 hr  FINDINGS: Jugular central line in the SVC region and unchanged. Again noted are low lung volumes with prominent lung markings suggesting edema. Left basilar densities are suggestive for consolidation and pleural fluid. Heart size remains upper limits of normal but unchanged. No evidence for a large  pneumothorax. Again noted is a right shoulder replacement.  IMPRESSION: Stable chest radiograph findings. Persistent left basilar densities probably represent consolidation and pleural fluid.  Low lung volumes and cannot exclude interstitial pulmonary edema.   Electronically Signed   By: Richarda Overlie M.D.   On: 01/06/2013 13:32   Dg Chest Port 1 View  01/06/2013   CLINICAL DATA:  Evaluate for infiltrates.  EXAM: PORTABLE CHEST - 1 VIEW  COMPARISON:  01/05/2013  FINDINGS: Right IJ central venous catheter has tip over the SVC just below the level of the carinal. Lungs are hypoinflated with slight worsening left basilar opacification and mild bilateral perihilar opacification. There likely a small amount of left pleural fluid present. There is mild stable cardiomegaly. Remainder of the exam is unchanged.  IMPRESSION: Worsening left basilar opacification which may be due to small effusion with atelectasis although cannot exclude infection in the left base. Slight worsening bilateral perihilar opacification as cannot exclude mild interstitial edema.  Stable cardiomegaly.  Right IJ central venous catheter with tip over the SVC.   Electronically Signed   By: Elberta Fortis M.D.   On: 01/06/2013 07:28   Dg Chest Port 1 View  01/05/2013   CLINICAL DATA:  Line placement  EXAM: PORTABLE CHEST - 1 VIEW  COMPARISON:  01/05/2013 322 hrs  FINDINGS: Cardiac shadow is stable. A right-sided jugular central line is now seen with the tip in the mid right atrium. No pneumothorax is seen. The overall inspiratory effort is poor with evidence of mild vascular congestion. Left basilar atelectasis is again noted.  IMPRESSION: No pneumothorax following central line placement.   Electronically Signed   By: Alcide Clever M.D.   On: 01/05/2013 11:30   Dg Chest Portable 1 View  01/05/2013   CLINICAL DATA:  Fever of 104.  Diarrhea and confusion.  EXAM: PORTABLE CHEST - 1 VIEW  COMPARISON:  None.  FINDINGS: Shallow inspiration. Heart size and  pulmonary vascularity  are probably normal for technique. There is suggestion of ulceration 0 left hemidiaphragm suggesting a small pleural effusion and basilar infiltration. Pneumonia could have this appearance. No apparent pneumothorax. Postoperative change in the right shoulder.  IMPRESSION: Technically limited study but suggestion of infiltration and effusion on the left. Changes likely represent left lower lobe pneumonia appear   Electronically Signed   By: Burman Nieves M.D.   On: 01/05/2013 04:03   Dg Abd Portable 1v  01/05/2013   CLINICAL DATA:  Fever, diarrhea, confusion.  EXAM: PORTABLE ABDOMEN - 1 VIEW  COMPARISON:  Technically limited study due to patient positioning.  FINDINGS: Paucity of gas in the abdomen. No small or large bowel distention is suggested. There are healing fractures of the right 11th and 12th ribs. Scoliosis and degenerative changes of the lumbar spine with postoperative changes in the lower lumbar spine. Pelvis appears intact.  IMPRESSION: Technically limited study. Relatively gasless abdomen without evidence of obstruction.   Electronically Signed   By: Burman Nieves M.D.   On: 01/05/2013 04:07    Microbiology: Recent Results (from the past 240 hour(s))  CLOSTRIDIUM DIFFICILE BY PCR     Status: Abnormal   Collection Time    01/05/13  5:25 AM      Result Value Range Status   C difficile by pcr POSITIVE (*) NEGATIVE Final   Comment: CRITICAL RESULT CALLED TO, READ BACK BY AND VERIFIED WITH:     SHEPHERD RN 9:35 01/05/13 ([email or web address])     Performed at [facility]  MRSA PCR SCREENING     Status: None   Collection Time    01/05/13  9:06 AM      Result Value Range Status   MRSA by PCR NEGATIVE  NEGATIVE Final   Comment:            The GeneXpert MRSA Assay (FDA     approved for NASAL specimens     only), is one component of a     comprehensive MRSA colonization     surveillance program. It is not     intended to diagnose MRSA     infection nor to guide  or     monitor treatment for     MRSA infections.  CULTURE, BLOOD (ROUTINE X 2)     Status: None   Collection Time    01/05/13  9:30 AM      Result Value Range Status   Specimen Description BLOOD LEFT ARM   Final   Special Requests BOTTLES DRAWN AEROBIC AND ANAEROBIC 10CC   Final   Culture  Setup Time     Final   Value: 01/05/2013 14:04     Performed at [facility]   Culture     Final   Value: ESCHERICHIA COLI     Note: Gram Stain Report Called to,Read Back By and Verified With: Lezlie Lye [id number] BRMEL     Performed at [facility]   Report Status 01/08/2013 FINAL   Final   Organism ID, Bacteria ESCHERICHIA COLI   Final  CULTURE, BLOOD (ROUTINE X 2)     Status: None   Collection Time    01/05/13  9:35 AM      Result Value Range Status   Specimen Description BLOOD LEFT HAND   Final   Special Requests BOTTLES DRAWN AEROBIC ONLY 5CC   Final   Culture  Setup Time     Final   Value: 01/05/2013 14:04  Performed at [facility]   Culture     Final   Value: NO GROWTH 5 DAYS     Performed at [facility]   Report Status 01/11/2013 FINAL   Final  URINE CULTURE     Status: None   Collection Time    01/05/13 10:27 AM      Result Value Range Status   Specimen Description URINE, CATHETERIZED   Final   Special Requests NONE   Final   Culture  Setup Time     Final   Value: 01/05/2013 14:03     Performed at [facility]   Colony Count     Final   Value: >=100,000 COLONIES/ML     Performed at [facility]   Culture     Final   Value: ESCHERICHIA COLI     Performed at [facility]   Report Status 01/09/2013 FINAL   Final   Organism ID, Bacteria ESCHERICHIA COLI   Final     Labs: Basic Metabolic Panel:  Recent Labs Lab 01/07/13 0430 01/08/13 0700 01/09/13 0503 01/10/13 0328 01/12/13 0520  NA 139 141 139 139 138  K 4.0 3.6* 3.6* 3.4* 3.1*  CL 105 106 104 102 100  CO2 20 22 22 23 24   GLUCOSE 97 178* 152* 201*  152*  BUN 63* 55* 47* 37* 25*  CREATININE 2.65* 2.13* 1.77* 1.52* 1.39*  CALCIUM 8.5 8.5 8.6 8.7 8.6   Liver Function Tests:  Recent Labs Lab 01/08/13 0700  AST 27  ALT 33  ALKPHOS 85  BILITOT 0.4  PROT 5.8*  ALBUMIN 2.1*   No results found for this basename: LIPASE, AMYLASE,  in the last 168 hours No results found for this basename: AMMONIA,  in the last 168 hours CBC:  Recent Labs Lab 01/08/13 0700 01/09/13 0503 01/10/13 0328 01/12/13 0520 01/13/13 0450  WBC 12.7* 10.5 10.1 15.1* 14.6*  HGB 9.4* 9.9* 10.4* 10.7* 10.7*  HCT 29.2* 30.3* 31.0* 32.5* 32.6*  MCV 88.0 87.3 86.1 87.1 86.7  PLT 103* 151 211 324 367   Cardiac Enzymes:  Recent Labs Lab 01/10/13 0231 01/10/13 0850 01/10/13 1505  CKTOTAL 22  --   --   CKMB 1.4  --   --   TROPONINI <0.30 <0.30 <0.30   BNP: BNP (last 3 results)  Recent Labs  01/05/13 1600 01/09/13 0503  PROBNP 8741.0* 6577.0*   CBG:  Recent Labs Lab 01/12/13 1214 01/12/13 1648 01/12/13 2249 01/13/13 0731 01/13/13 1148  GLUCAP 243* 191* 148* 125* 230*       Signed:  Penny PiaVEGA, Steaven Wholey  [facility] 01/13/2013, 1:56 PM

## 2013-01-13 NOTE — Progress Notes (Signed)
Pam, RN-Nurse Supervisor at [facility] given updated report via phone. [facility] yet [facility] unable to get dinner before [facility] came in for transport, therefore CBG not covered with insulin. Pam responded "We will recheck and get pt dinner upon arrival here at the home". Packet given to [facility] attendants [facility]. Pt discharged via stretcher accompanied by PTAR attendants x 2 to meeting awaiting ambulance to transport to facility.

## 2013-01-13 NOTE — Progress Notes (Signed)
Speech Language Pathology Treatment: Dysphagia  Patient Details Name: Gwyneth SproutWilliam Hentz MRN: [id number] DOB: Nov 25, 1940 Today's Date: 01/13/2013 Time: [id number] SLP Time Calculation (min): 10 min  Assessment / Plan / Recommendation Clinical Impression  Pt continues to tolerate full liquid per RN.  New orders received for BSE, despite SLP coverage since initial evaluation last Friday. [facility].   HPI HPI: 73 yo male resident of snf admitted with ams, found to have flu, cdif and be septic.  PMH + for dysarthria per snf referral form, DM, HTN.  Pt underwent cystoscopy with stent placement.  Bedside swallow eval completed on Friday with aspiration precautions recommended.  Pt seen for follow up therapy to assess tolerance of po diet.      Pertinent Vitals VSS  Ravyn Nikkel B. [place], [facility] CenterMSP, CCC-SLP [phone]                          GO     Leigh AuroraBueche, Asaiah [place]unn[place]utt Brown 01/13/2013, 4:35 PM

## 2013-01-13 NOTE — Progress Notes (Signed)
Physical Therapy Treatment Patient Details Name: Gwyneth SproutWilliam Bonaparte MRN: [id number] DOB: [birth year] Today's Date: 01/13/2013 Time: [id number] PT Time Calculation (min): 17 min  PT Assessment / Plan / Recommendation  History of Present Illness     PT Comments   Pt continues to require Max-total assist of 2 for mobility. Pt agreeable to repositioning in bed however pt declined any further activity at this time. States [facility]. Possible d/c later today to SNF  Follow Up Recommendations  SNF;Supervision/Assistance - 24 hour     Does the patient have the potential to tolerate intense rehabilitation     Barriers to Discharge        Equipment Recommendations  None recommended by PT    Recommendations for Other Services    Frequency Min 3X/week   Progress towards PT Goals Progress towards PT goals: Not progressing toward goals - comment  Plan Current plan remains appropriate    Precautions / Restrictions Precautions Precautions: Fall Restrictions Weight Bearing Restrictions: No   Pertinent Vitals/Pain Back-unrated    Mobility  Bed Mobility Overal bed mobility: +2 for physical assistance;+ 2 for safety/equipment;Needs Assistance Bed Mobility: Rolling Rolling: Max assist;+2 for physical assistance;+2 for safety/equipment General bed mobility comments: Rolled to R side for positioning and pillow placed under L side for comfort.  Total assist of 2 for scoot to [facility]. Utilized bedpad for scooting, positioning.     Exercises     PT Diagnosis:    PT Problem List:   PT Treatment Interventions:     PT Goals (current goals can now be found in the care plan section)    Visit Information  Last PT Received On: 01/13/13 Assistance Needed: +2    Subjective Data      Cognition  Cognition Arousal/Alertness: Awake/alert Behavior During Therapy: WFL for tasks assessed/performed Overall Cognitive Status: History of cognitive impairments - at baseline     Balance     End of Session PT - End of Session Activity Tolerance: Patient limited by pain Patient left: in bed;with call bell/phone within reach;with bed alarm set   GP     Rebeca AlertJannie Sundeep Destin, MPT Pager: [phone]

## 2013-01-13 NOTE — Progress Notes (Signed)
[facility] regarding pt status and plans to transfer back this afternoon. Detailed report given via phone to [facility], RN-Nursing Supervisor. [facility] notified via phone [facility].

## 2013-01-17 ENCOUNTER — Encounter (HOSPITAL_COMMUNITY): Payer: Self-pay | Admitting: Pharmacy Technician

## 2013-01-17 ENCOUNTER — Encounter (HOSPITAL_COMMUNITY): Payer: Self-pay | Admitting: *Deleted

## 2013-01-17 NOTE — Progress Notes (Signed)
Requested and received [facility]  Spoke with Benard HalstedNickey Shepherd, nurse and she faxed Current MAR,  [facility]  Current MAR given to pharmacy tech.  Nurse aware we will be faxing preop instructions.

## 2013-01-18 MED ORDER — GENTAMICIN SULFATE 40 MG/ML IJ SOLN
450.0000 mg | INTRAVENOUS | Status: AC
  Administered 2013-01-19: 450 mg via INTRAVENOUS
  Filled 2013-01-18: qty 11.25

## 2013-01-18 NOTE — Progress Notes (Signed)
Spoke with Henry Wyatt, [facility].  She reviewed again drug allergies for patient.  Henry HalstedNickey  Wyatt stated she had no questions regarding preop instructions that were faced on 01/17/13.  Henry Halstedickey Wyatt stated that wife planned to be here for surgery on 01/19/13.  I spoke with Henry Wyatt at [phone] and she stated she planned to be here on 01/19/13 at 1000am,  [place].  Unless weather is bad in am.  Wife stated that patient is alert and oriented and able to sign own consent form and is fully aware of surgery planned on 01/19/13.

## 2013-01-18 NOTE — Progress Notes (Signed)
Spoke with Benard HalstedNickey Shepherd, [facility].  Miki Kinsickey stated patient is on no other Insulin except for the Novolog Sliding Scale.  She states he has not had to receive any Sliding Scale insulin since 01/14/2013.

## 2013-01-19 ENCOUNTER — Encounter (HOSPITAL_COMMUNITY): Payer: Medicare Other | Admitting: Certified Registered Nurse Anesthetist

## 2013-01-19 ENCOUNTER — Ambulatory Visit (HOSPITAL_COMMUNITY): Payer: Medicare Other

## 2013-01-19 ENCOUNTER — Ambulatory Visit (HOSPITAL_COMMUNITY): Payer: Medicare Other | Admitting: Certified Registered Nurse Anesthetist

## 2013-01-19 ENCOUNTER — Encounter (HOSPITAL_COMMUNITY): Payer: Self-pay | Admitting: Anesthesiology

## 2013-01-19 ENCOUNTER — Encounter (HOSPITAL_COMMUNITY)
Admission: RE | Disposition: A | Discharge status: Skilled Nursing Facility | Payer: Self-pay | Source: Ambulatory Visit | Attending: Urology

## 2013-01-19 ENCOUNTER — Ambulatory Visit (HOSPITAL_COMMUNITY)
Admission: RE | Admit: 2013-01-19 | Discharge: 2013-01-19 | Disposition: A | Discharge status: Skilled Nursing Facility | Payer: Medicare Other | Source: Ambulatory Visit | Attending: Urology | Admitting: Urology

## 2013-01-19 DIAGNOSIS — R509 Fever, unspecified: Secondary | ICD-10-CM | POA: Insufficient documentation

## 2013-01-19 DIAGNOSIS — I959 Hypotension, unspecified: Secondary | ICD-10-CM | POA: Insufficient documentation

## 2013-01-19 DIAGNOSIS — I10 Essential (primary) hypertension: Secondary | ICD-10-CM | POA: Insufficient documentation

## 2013-01-19 DIAGNOSIS — N201 Calculus of ureter: Secondary | ICD-10-CM | POA: Insufficient documentation

## 2013-01-19 DIAGNOSIS — R4182 Altered mental status, unspecified: Secondary | ICD-10-CM | POA: Insufficient documentation

## 2013-01-19 DIAGNOSIS — Z87891 Personal history of nicotine dependence: Secondary | ICD-10-CM | POA: Insufficient documentation

## 2013-01-19 DIAGNOSIS — Z794 Long term (current) use of insulin: Secondary | ICD-10-CM | POA: Insufficient documentation

## 2013-01-19 DIAGNOSIS — Z86718 Personal history of other venous thrombosis and embolism: Secondary | ICD-10-CM | POA: Insufficient documentation

## 2013-01-19 DIAGNOSIS — E119 Type 2 diabetes mellitus without complications: Secondary | ICD-10-CM | POA: Insufficient documentation

## 2013-01-19 HISTORY — DX: Diarrhea, unspecified: R19.7

## 2013-01-19 HISTORY — DX: Sepsis, unspecified organism: A41.9

## 2013-01-19 HISTORY — DX: Heart failure, unspecified: I50.9

## 2013-01-19 HISTORY — DX: Enterocolitis due to Clostridium difficile, not specified as recurrent: A04.72

## 2013-01-19 HISTORY — DX: Calculus of kidney: N20.0

## 2013-01-19 HISTORY — DX: Influenza due to other identified influenza virus with other respiratory manifestations: J10.1

## 2013-01-19 HISTORY — DX: Altered mental status, unspecified: R41.82

## 2013-01-19 HISTORY — DX: Pneumonia, unspecified organism: J18.9

## 2013-01-19 HISTORY — DX: Severe sepsis with septic shock: R65.21

## 2013-01-19 HISTORY — DX: Hypokalemia: E87.6

## 2013-01-19 HISTORY — DX: Dysphagia, unspecified: R13.10

## 2013-01-19 HISTORY — DX: Unspecified hydronephrosis: N13.30

## 2013-01-19 HISTORY — DX: Acute kidney failure, unspecified: N17.9

## 2013-01-19 HISTORY — DX: Unspecified dementia, unspecified severity, without behavioral disturbance, psychotic disturbance, mood disturbance, and anxiety: F03.90

## 2013-01-19 HISTORY — DX: Acute pyelonephritis: N10

## 2013-01-19 HISTORY — PX: HOLMIUM LASER APPLICATION: SHX5852

## 2013-01-19 HISTORY — PX: CYSTOSCOPY WITH URETEROSCOPY: SHX5123

## 2013-01-19 LAB — GLUCOSE, CAPILLARY
GLUCOSE-CAPILLARY: 136 mg/dL — AB (ref 70–99)
Glucose-Capillary: 134 mg/dL — ABNORMAL HIGH (ref 70–99)

## 2013-01-19 SURGERY — CYSTOSCOPY WITH URETEROSCOPY
Anesthesia: General | Site: Perineum | Laterality: Left

## 2013-01-19 MED ORDER — LIDOCAINE HCL (CARDIAC) 20 MG/ML IV SOLN
INTRAVENOUS | Status: AC
  Filled 2013-01-19: qty 5

## 2013-01-19 MED ORDER — SODIUM CHLORIDE 0.9 % IR SOLN
Status: DC | PRN
  Administered 2013-01-19: 1000 mL via INTRAVESICAL

## 2013-01-19 MED ORDER — LIDOCAINE HCL (CARDIAC) 20 MG/ML IV SOLN
INTRAVENOUS | Status: DC | PRN
  Administered 2013-01-19: 80 mg via INTRAVENOUS

## 2013-01-19 MED ORDER — FENTANYL CITRATE 0.05 MG/ML IJ SOLN
INTRAMUSCULAR | Status: AC
  Filled 2013-01-19: qty 5

## 2013-01-19 MED ORDER — PROPOFOL 10 MG/ML IV BOLUS
INTRAVENOUS | Status: DC | PRN
  Administered 2013-01-19: 90 mg via INTRAVENOUS

## 2013-01-19 MED ORDER — MIDAZOLAM HCL 2 MG/2ML IJ SOLN
INTRAMUSCULAR | Status: AC
  Filled 2013-01-19: qty 2

## 2013-01-19 MED ORDER — LACTATED RINGERS IV SOLN
INTRAVENOUS | Status: DC | PRN
  Administered 2013-01-19: 12:00:00 via INTRAVENOUS

## 2013-01-19 MED ORDER — PROPOFOL 10 MG/ML IV BOLUS
INTRAVENOUS | Status: AC
  Filled 2013-01-19: qty 20

## 2013-01-19 MED ORDER — EPHEDRINE SULFATE 50 MG/ML IJ SOLN
INTRAMUSCULAR | Status: DC | PRN
  Administered 2013-01-19 (×2): 5 mg via INTRAVENOUS

## 2013-01-19 MED ORDER — PHENYLEPHRINE HCL 10 MG/ML IJ SOLN
INTRAMUSCULAR | Status: DC | PRN
  Administered 2013-01-19 (×2): 80 ug via INTRAVENOUS

## 2013-01-19 MED ORDER — DEXAMETHASONE SODIUM PHOSPHATE 10 MG/ML IJ SOLN
INTRAMUSCULAR | Status: AC
  Filled 2013-01-19: qty 1

## 2013-01-19 MED ORDER — ONDANSETRON HCL 4 MG/2ML IJ SOLN
INTRAMUSCULAR | Status: AC
  Filled 2013-01-19: qty 2

## 2013-01-19 MED ORDER — FENTANYL CITRATE 0.05 MG/ML IJ SOLN
INTRAMUSCULAR | Status: DC | PRN
  Administered 2013-01-19: 25 ug via INTRAVENOUS
  Administered 2013-01-19: 50 ug via INTRAVENOUS

## 2013-01-19 MED ORDER — ONDANSETRON HCL 4 MG/2ML IJ SOLN
INTRAMUSCULAR | Status: DC | PRN
  Administered 2013-01-19: 4 mg via INTRAVENOUS

## 2013-01-19 MED ORDER — PHENYLEPHRINE 40 MCG/ML (10ML) SYRINGE FOR IV PUSH (FOR BLOOD PRESSURE SUPPORT)
PREFILLED_SYRINGE | INTRAVENOUS | Status: AC
  Filled 2013-01-19: qty 10

## 2013-01-19 SURGICAL SUPPLY — 16 items
BAG URO CATCHER STRL LF (DRAPE) ×4 IMPLANT
CATH INTERMIT  6FR 70CM (CATHETERS) ×4 IMPLANT
CATH URET 5FR 28IN CONE TIP (BALLOONS)
CATH URET 5FR 70CM CONE TIP (BALLOONS) IMPLANT
CLOTH BEACON ORANGE TIMEOUT ST (SAFETY) ×4 IMPLANT
DRAPE CAMERA CLOSED 9X96 (DRAPES) ×4 IMPLANT
FIBER LASER FLEXIVA 365 (UROLOGICAL SUPPLIES) ×4 IMPLANT
GLOVE BIOGEL M STRL SZ7.5 (GLOVE) ×4 IMPLANT
GOWN STRL REUS W/TWL LRG LVL3 (GOWN DISPOSABLE) ×4 IMPLANT
GOWN STRL REUS W/TWL XL LVL3 (GOWN DISPOSABLE) ×4 IMPLANT
GUIDEWIRE STR DUAL SENSOR (WIRE) ×4 IMPLANT
MANIFOLD NEPTUNE II (INSTRUMENTS) ×4 IMPLANT
PACK CYSTO (CUSTOM PROCEDURE TRAY) ×4 IMPLANT
TUBING CONNECTING 10 (TUBING) ×3 IMPLANT
TUBING CONNECTING 10' (TUBING) ×1
WIRE COONS/BENSON .038X145CM (WIRE) ×4 IMPLANT

## 2013-01-19 NOTE — Anesthesia Preprocedure Evaluation (Addendum)
Anesthesia Evaluation  Patient identified by MRN, date of birth, ID band Patient awake  General Assessment Comment: Hypertension     .  Diabetes mellitus without complication     .  Constipation     .  DVT (deep venous thrombosis)     .  Back pain     .  Kidney injury     Reviewed: Allergy & Precautions, H&P , NPO status , Patient's Chart, lab work & pertinent test results  Airway Mallampati: II TM Distance: >3 FB Neck ROM: Full    Dental no notable dental hx.    Pulmonary neg pulmonary ROS, pneumonia -, former smoker,  breath sounds clear to auscultation  Pulmonary exam normal       Cardiovascular hypertension, Pt. on medications +CHF negative cardio ROS  Rhythm:Regular Rate:Normal  EF 35%   Neuro/Psych PSYCHIATRIC DISORDERS negative neurological ROS  negative psych ROS   GI/Hepatic negative GI ROS, Neg liver ROS,   Endo/Other  negative endocrine ROSdiabetes, Type 1, Insulin Dependent  Renal/GU Renal diseasenegative Renal ROS  negative genitourinary   Musculoskeletal negative musculoskeletal ROS (+)   Abdominal   Peds negative pediatric ROS (+)  Hematology negative hematology ROS (+)   Anesthesia Other Findings   Reproductive/Obstetrics negative OB ROS                        Anesthesia Physical Anesthesia Plan  ASA: IV  Anesthesia Plan: General   Post-op Pain Management:    Induction: Intravenous  Airway Management Planned: LMA  Additional Equipment:   Intra-op Plan:   Post-operative Plan: Extubation in OR  Informed Consent: I have reviewed the patients History and Physical, chart, labs and discussed the procedure including the risks, benefits and alternatives for the proposed anesthesia with the patient or authorized representative who has indicated his/her understanding and acceptance.   Dental advisory given  Plan Discussed with: CRNA  Anesthesia Plan Comments:         Anesthesia Quick Evaluation

## 2013-01-19 NOTE — Anesthesia Postprocedure Evaluation (Signed)
  Anesthesia Post-op Note  Patient: Henry Wyatt  Procedure(s) Performed: Procedure(s) (LRB): CYSTOSCOPY WITH URETEROSCOPY (Left) HOLMIUM LASER APPLICATION (Left)  Patient Location: PACU  Anesthesia Type: General  Level of Consciousness: awake and alert   Airway and Oxygen Therapy: Patient Spontanous Breathing  Post-op Pain: mild  Post-op Assessment: Post-op Vital signs reviewed, Patient's Cardiovascular Status Stable, Respiratory Function Stable, Patent Airway and No signs of Nausea or vomiting  Last Vitals:  Filed Vitals:   01/19/13 1455  BP: 130/68  Pulse: 88  Temp:   Resp: 20    Post-op Vital Signs: stable   Complications: No apparent anesthesia complications

## 2013-01-19 NOTE — Progress Notes (Signed)
Spoke w Thurston HoleAnne LPN at [facility] this am to clarify NPO  And med status

## 2013-01-19 NOTE — Discharge Instructions (Signed)

## 2013-01-19 NOTE — Preoperative (Signed)
Beta Blockers   Reason not to administer Beta Blockers:patient took beta blocker 01/19/13

## 2013-01-19 NOTE — H&P (View-Only) (Signed)
Urology Consult  Referring physician: Asencion Noble, M.D. Reason for referral: 7 mm left ureteral calculus with urosepsis  History of Present Illness: 73 year old male who has recently resided in a skilled nursing facility who was noted to be febrile to 104 with significant mental status changes and hypotension. He's been assessed in the emergency room and now is on the cortical or service. He appears to clinically have probable urosepsis. Stone protocol CT revealed a 7 mm obstructing stone in the left mid ureter. The patient has elevated creatinine. He is  unable to provide any additional history. By phone his wife reports to me that he has had a few clinical stone event sac but none recently and has not required any surgical intervention. He does not appear to have any other active urology problems. The patient is currently being stabilized by critical care medicine is to be started on pressor support. A central line as planned to be placed in the near future. The patient remains critically ill. He is on broad-spectrum antibiotics.  Past Medical History  Diagnosis Date  . Hypertension   . Diabetes mellitus without complication   . Constipation   . DVT (deep venous thrombosis)   . Back pain   . Kidney injury    History reviewed. No pertinent past surgical history.  Medications:  Scheduled: . aztreonam  1 g Intravenous Q8H  . insulin aspart  0-15 Units Subcutaneous TID WC  . [START ON 01/06/2013] oseltamivir  30 mg Oral Daily  . pantoprazole (PROTONIX) IV  40 mg Intravenous Q24H  . sodium chloride  1,000 mL Intravenous Once  . vancomycin  2,000 mg Intravenous Once    Allergies:  Allergies  Allergen Reactions  . Carbapenems Other (See Comments)    Per nursing home [facility]  . Cephalosporins Other (See Comments)    Per nursing home mar  . Erythromycin Other (See Comments)    Per nursing home mar  . Penicillins Other (See Comments)    Per nursing home mar  . Sulfa Antibiotics Other  (See Comments)    Per nursing home [facility]    No family history on file.  Social History:  reports that he has quit smoking. He does not have any smokeless tobacco history on file. He reports that he does not drink alcohol or use illicit drugs.  ROS not obtainable.  Physical Exam:  Vital signs in last 24 hours: Temp:  [103.4 F (39.7 C)-104.5 F (40.3 C)] 103.8 F (39.9 C) (12/31 0659) Pulse Rate:  [100-106] 100 (12/31 0900) Resp:  [13-32] 18 (12/31 0900) BP: (70-115)/(37-85) 70/39 mmHg (12/31 0900) SpO2:  [92 %-99 %] 95 % (12/31 0900) Weight:  [225 lb 1.4 oz (102.1 kg)] 225 lb 1.4 oz (102.1 kg) (12/31 0857)  Constitutional: Vital signs reviewed. WD WN the patient is arousable. He is able to tell me his name. Head: Normocephalic and atraumatic   Eyes: PERRL, No scleral icterus.  Neck: Supple No  Gross JVD, mass, thyromegaly, or carotid bruit present.  Cardiovascular: RRR Pulmonary/Chest: Normal effort Abdominal: Soft. Obese. Questionable left-sided CVA tenderness. No evidence of surgical abdomen. Genitourinary: Indwelling Foley catheter draining small amount of cloudy urine. Extremities: No cyanosis or edema  Neurological: Grossly non-focal.  Skin: Warm,very dry and intact. No rash, cyanosis   Laboratory Data:  Results for orders placed during the hospital encounter of 01/05/13 (from the past 72 hour(s))  CBC     Status: Abnormal   Collection Time    01/05/13  4:40 AM  Result Value Range   WBC 6.0  4.0 - 10.5 K/uL   RBC 4.04 (*) 4.22 - 5.81 MIL/uL   Hemoglobin 11.8 (*) 13.0 - 17.0 g/dL   HCT 35.9 (*) 39.0 - 52.0 %   MCV 88.9  78.0 - 100.0 fL   MCH 29.2  26.0 - 34.0 pg   MCHC 32.9  30.0 - 36.0 g/dL   RDW 15.4  11.5 - 15.5 %   Platelets 127 (*) 150 - 400 K/uL  COMPREHENSIVE METABOLIC PANEL     Status: Abnormal   Collection Time    01/05/13  4:40 AM      Result Value Range   Sodium 133 (*) 137 - 147 mEq/L   Comment: Please note change in reference range.    Potassium 3.9  3.7 - 5.3 mEq/L   Comment: Please note change in reference range.   Chloride 95 (*) 96 - 112 mEq/L   CO2 21  19 - 32 mEq/L   Glucose, Bld 156 (*) 70 - 99 mg/dL   BUN 49 (*) 6 - 23 mg/dL   Creatinine, Ser 2.71 (*) 0.50 - 1.35 mg/dL   Calcium 8.7  8.4 - 10.5 mg/dL   Total Protein 6.4  6.0 - 8.3 g/dL   Albumin 2.8 (*) 3.5 - 5.2 g/dL   AST 16  0 - 37 U/L   ALT 11  0 - 53 U/L   Alkaline Phosphatase 166 (*) 39 - 117 U/L   Total Bilirubin 0.5  0.3 - 1.2 mg/dL   GFR calc non Af Amer 22 (*) >90 mL/min   GFR calc Af Amer 25 (*) >90 mL/min   Comment: (NOTE)     The eGFR has been calculated using the CKD EPI equation.     This calculation has not been validated in all clinical situations.     eGFR's persistently <90 mL/min signify possible Chronic Kidney     Disease.  CG4 I-STAT (LACTIC ACID)     Status: Abnormal   Collection Time    01/05/13  4:49 AM      Result Value Range   Lactic Acid, Venous 4.83 (*) 0.5 - 2.2 mmol/L  POCT I-STAT, CHEM 8     Status: Abnormal   Collection Time    01/05/13  4:51 AM      Result Value Range   Sodium 137  137 - 147 mEq/L   Potassium 4.0  3.7 - 5.3 mEq/L   Chloride 100  96 - 112 mEq/L   BUN 47 (*) 6 - 23 mg/dL   Creatinine, Ser 2.90 (*) 0.50 - 1.35 mg/dL   Glucose, Bld 156 (*) 70 - 99 mg/dL   Calcium, Ion 1.14  1.13 - 1.30 mmol/L   TCO2 21  0 - 100 mmol/L   Hemoglobin 12.9 (*) 13.0 - 17.0 g/dL   HCT 38.0 (*) 39.0 - 52.0 %  INFLUENZA PANEL BY PCR     Status: Abnormal   Collection Time    01/05/13  5:14 AM      Result Value Range   Influenza A By PCR POSITIVE (*) NEGATIVE   Influenza B By PCR NEGATIVE  NEGATIVE   H1N1 flu by pcr NOT DETECTED  NOT DETECTED   Comment:            The Xpert Flu assay (FDA approved for     nasal aspirates or washes and     nasopharyngeal swab specimens), is     intended as  an aid in the diagnosis of     influenza and should not be used as     a sole basis for treatment.     Performed at [facility] DIFFICILE BY PCR     Status: Abnormal   Collection Time    01/05/13  5:25 AM      Result Value Range   C difficile by pcr POSITIVE (*) NEGATIVE   Comment: CRITICAL RESULT CALLED TO, READ BACK BY AND VERIFIED WITH:     SHEPHERD RN 9:35 01/05/13 ([email or web address])     Performed at [facility], [facility]     Status: Abnormal   Collection Time    01/05/13  9:00 AM      Result Value Range   Glucose-Capillary 204 (*) 70 - 99 mg/dL   Recent Results (from the past 240 hour(s))  CLOSTRIDIUM DIFFICILE BY PCR     Status: Abnormal   Collection Time    01/05/13  5:25 AM      Result Value Range Status   C difficile by pcr POSITIVE (*) NEGATIVE Final   Comment: CRITICAL RESULT CALLED TO, READ BACK BY AND VERIFIED WITH:     SHEPHERD RN 9:35 01/05/13 ([email or web address])     Performed at [facility]   Creatinine:  Recent Labs  01/05/13 0440 01/05/13 0451  CREATININE 2.71* 2.90*   Baseline Creatinine: Unknown  Impression/Assessment:  Probable urosepsis. The patient has an obstructing 7 mm stone in his presented with fever, hypotension and mental status changes. He requires stabilization with pressor support, broad-spectrum antibiotics and critical care input. Once stabilized we will plan on bringing him to the OR for placement of a double-J stent for decompression of the left kidney. No attempt at stone manipulation will be performed. If the patient survives this acute insult then definitive stone management will need to be undertaken at a later date.  Plan:  As above  Jadeyn Hargett S 01/05/2013, 9:57 AM

## 2013-01-19 NOTE — Op Note (Signed)
Preoperative diagnosis: Left ureteral calculus with history of urosepsis Postoperative diagnosis: Same  Procedure: Cystoscopy, left double-J stent removal, left ureteroscopy, holmium laser lithotripsy   Surgeon: Valetta Fulleravid S. Jerni Selmer M.D.  Anesthesia: Gen.  Indications: patient is 73 years of age and a resident of a skilled nursing facility. He recently was admitted with urosepsis. Imaging revealed a 7-8 mm left midureteral calculus. The patient underwent urgent decompression of his left collecting system with double-J stent placement. The patient subsequently recovered and was discharged back to his nursing home. He presents now for definitive management of his ureteral stone no Foley for removal of his double-J stent.     Technique and findings: Patient was brought the operating room where he had successful induction general anesthesia. He was placed in lithotomy position prepped and draped in usual manner. The patient received perioperative antibiotics. Cystoscopy revealed moderate trilobar hyperplasia. The left double-J stent appear to be in good position. He was partially extracted and a guidewire placed up to the left renal pelvis. The left ureter ureter was engaged with a 6 French ureteroscope. 7-8 mm stone was encountered which was treated with holmium laser lithotripsy. The stone was fractured with an energy of 0.8 J and 5 Hz. The stone was broken into approximately 4-6 pieces. The fragments were then basket extracted with a Nitinol basket. There is no evidence ureteral injury. We did not have to dilate the ureter and I did not fill patient require replacement of his double-J stent. The patient's bladder was drained. A new Foley catheter was inserted and the patient was brought to recovery in stable condition.

## 2013-01-19 NOTE — Transfer of Care (Signed)
Immediate Anesthesia Transfer of Care Note  Patient: Henry Wyatt  Procedure(s) Performed: Procedure(s) (LRB): CYSTOSCOPY WITH URETEROSCOPY (Left) HOLMIUM LASER APPLICATION (Left)  Patient Location: PACU  Anesthesia Type: General  Level of Consciousness: sedated, patient cooperative and responds to stimulation  Airway & Oxygen Therapy: Patient Spontanous Breathing and Patient connected to face mask oxgen  Post-op Assessment: Report given to PACU RN and Post -op Vital signs reviewed and stable  Post vital signs: Reviewed and stable  Complications: No apparent anesthesia complications

## 2013-01-19 NOTE — Interval H&P Note (Signed)
History and Physical Interval Note:  The patient was initially admitted with urosepsis. A stent was placed and the patient was able recover from the acute illness. He presents now for definitive management of his ureteral stone.  01/19/2013 8:06 AM  Henry SproutWilliam Ing  has presented today for surgery, with the diagnosis of Left Ureteral Calculus  The various methods of treatment have been discussed with the patient and family. After consideration of risks, benefits and other options for treatment, the patient has consented to  Procedure(s) with comments: CYSTOSCOPY WITH URETEROSCOPY (Left) - REMOVAL OF JJ STENT     HOLMIUM LASER APPLICATION (Left) as a surgical intervention .  The patient's history has been reviewed, patient examined, no change in status, stable for surgery.  I have reviewed the patient's chart and labs.  Questions were answered to the patient's satisfaction.     [name]

## 2013-01-20 ENCOUNTER — Encounter (HOSPITAL_COMMUNITY): Payer: Self-pay | Admitting: Urology

## 2013-04-04 ENCOUNTER — Ambulatory Visit (INDEPENDENT_AMBULATORY_CARE_PROVIDER_SITE_OTHER): Payer: Medicare Other | Admitting: Podiatry

## 2013-04-04 ENCOUNTER — Encounter: Payer: Self-pay | Admitting: Podiatry

## 2013-04-04 VITALS — BP 148/88 | HR 80 | Resp 15 | Ht 73.0 in | Wt 200.0 lb

## 2013-04-04 DIAGNOSIS — L03039 Cellulitis of unspecified toe: Secondary | ICD-10-CM

## 2013-04-04 NOTE — Progress Notes (Signed)
   Subjective:    Patient ID: Henry Wyatt, male    DOB: 11-Jun-1940, 73 y.o.   MRN: [id number]  HPI N debridement        L B/L 1 - 5 toenails         D and O long-term        C thick, elongated toenails        A Diabetic pt, difficult to trim        T history of podiatric care in [place]  Patient presents with wife and lives in a nursing home   Review of Systems  All other systems reviewed and are negative.       Objective:   Physical Exam  A 73 year old white male seated in a wheelchair and is unable to transfer to treatment chair. He is wearing braces on the lower extremities and is difficult to understand her communicate with. His wife is present and is requesting nail debridement.  Objective: DP and PT pulses are palpable bilaterally  Neurological: Deferred in 10 g monofilament vibratory sensation because of patient's difficulty in communicating.  Dermatological: The second right toe demonstrates erythema to the distal interphalangeal joint without any active drainage, warmth or malodor noted. The margins are somewhat incurvated.  The remaining toenails are elongated with occasional dystrophic changes.  Musculoskeletal: No deformities noted          Assessment & Plan:   Assessment: Paronychia second right toe Non dystrophic toenails x10  Plan: I did debrided the margins of the second right toenail without any bleeding. Remaining toenails were debrided. The nursing home did not provide a consult sheet. In the after visit summary I recommended Cipro 500 mg by mouth twice a day x7 days , unless there is a contraindication.. All medications are issued to the nursing home. Please note patient has multiple drug allergies to antibiotics, however, Cipro was not listed. Also, I recommended application of topical antibiotic ointment the second right toe until healed.

## 2013-04-04 NOTE — Patient Instructions (Addendum)
The second right toe demonstrates a paronychia. Today I debrided the margin and apply topical antibiotic ointment. Continue to apply topical antibiotic ointment to the second right toenail daily until healed.  Patient will need oral antibiotics. Unles there is a contraindication I  will recommend Cipro 500 mg by mouth 1 twice a day x7 days.

## 2013-04-05 ENCOUNTER — Encounter: Payer: Self-pay | Admitting: Podiatry

## 2013-05-26 ENCOUNTER — Other Ambulatory Visit: Payer: Self-pay | Admitting: Geriatric Medicine

## 2013-05-26 DIAGNOSIS — H532 Diplopia: Secondary | ICD-10-CM

## 2013-06-06 ENCOUNTER — Ambulatory Visit
Admission: RE | Admit: 2013-06-06 | Discharge: 2013-06-06 | Disposition: A | Discharge status: Home / Self Care | Payer: Medicare Other | Source: Ambulatory Visit | Attending: Geriatric Medicine | Admitting: Geriatric Medicine

## 2013-06-06 ENCOUNTER — Other Ambulatory Visit: Payer: Self-pay | Admitting: Geriatric Medicine

## 2013-06-06 DIAGNOSIS — H532 Diplopia: Secondary | ICD-10-CM

## 2013-06-08 ENCOUNTER — Ambulatory Visit
Admission: RE | Admit: 2013-06-08 | Discharge: 2013-06-08 | Disposition: A | Discharge status: Home / Self Care | Payer: Medicare Other | Source: Ambulatory Visit | Attending: Geriatric Medicine | Admitting: Geriatric Medicine

## 2013-06-08 ENCOUNTER — Encounter (INDEPENDENT_AMBULATORY_CARE_PROVIDER_SITE_OTHER): Payer: Self-pay

## 2013-06-08 DIAGNOSIS — H532 Diplopia: Secondary | ICD-10-CM

## 2013-06-08 MED ORDER — GADOBENATE DIMEGLUMINE 529 MG/ML IV SOLN
19.0000 mL | Freq: Once | INTRAVENOUS | Status: AC | PRN
  Administered 2013-06-08: 19 mL via INTRAVENOUS

## 2013-07-04 ENCOUNTER — Encounter: Payer: Self-pay | Admitting: Podiatry

## 2013-07-04 ENCOUNTER — Ambulatory Visit (INDEPENDENT_AMBULATORY_CARE_PROVIDER_SITE_OTHER): Payer: Medicare Other | Admitting: Podiatry

## 2013-07-04 VITALS — BP 117/96 | HR 87 | Resp 18

## 2013-07-04 DIAGNOSIS — L600 Ingrowing nail: Secondary | ICD-10-CM

## 2013-07-05 NOTE — Progress Notes (Signed)
Patient ID: Henry SproutWilliam Kirk, male   DOB: September 13, 1940, 73 y.o.   MRN: [id number]  Subjective: Patient appears responsive in a seated in a wheelchair and is unable to transfer to treatment chair  Objective: Incurvated, elongated toenails x10  Assessment: Incurvated toenails non- dystrophic x10  Plan: Nails x10 are debrided without any bleeding  Reappoint when necessary

## 2013-09-26 ENCOUNTER — Ambulatory Visit (INDEPENDENT_AMBULATORY_CARE_PROVIDER_SITE_OTHER): Payer: Medicare Other | Admitting: Podiatry

## 2013-09-26 DIAGNOSIS — L03039 Cellulitis of unspecified toe: Secondary | ICD-10-CM

## 2013-09-26 DIAGNOSIS — L608 Other nail disorders: Secondary | ICD-10-CM

## 2013-09-26 NOTE — Patient Instructions (Signed)
Patient is history of allergies to cephalosporins DC cephalexin Rx Cipro 500 mg #14 one twice a day

## 2013-09-27 ENCOUNTER — Encounter: Payer: Self-pay | Admitting: Podiatry

## 2013-09-27 NOTE — Progress Notes (Signed)
Patient ID: Henry Wyatt, male   DOB: [birth year], 73 y.o.   MRN: [id number]  Subjective: This patient presents with his wife who states that the second left toenail was been red for several days and patient was placed on cephalexin twice a day for this problem. He has a history of allergies to cephalosporins. He has a history of a paronychia on the second right toenail on the visit of 04/04/2013 that was treated with Cipro 500 mg twice a day/. The patient's wife is requesting debridement of all the toenails.  Objective: Vascular: DP and PT pulses 2/4 bilaterally Dorsal edema bilaterally  Dermatological: Incurvated toenails x10 The medial margin of the second left toenail is edematous, erythematous without  drainage, malodor or warmth. The margin is mildly incurvated.  Assessment: Onychia second left toenail Incurvated toenails 6-10  Plan: The medial margin of the second left toenail was debrided without any bleeding. All remaining toenails were debrided at the request of his wife  DC cephalexin Patient given hand written prescription for Cipro 500 mg twice a day x7 days  Reappoint if onychia does not respond to Cipro

## 2013-09-28 ENCOUNTER — Ambulatory Visit: Payer: Medicare Other | Admitting: Podiatry

## 2013-10-03 ENCOUNTER — Ambulatory Visit: Payer: Medicare Other | Admitting: Podiatry

## 2013-10-10 ENCOUNTER — Ambulatory Visit: Payer: Medicare Other | Admitting: Podiatry

## 2014-01-02 ENCOUNTER — Encounter: Payer: Self-pay | Admitting: Podiatry

## 2014-01-02 ENCOUNTER — Ambulatory Visit (INDEPENDENT_AMBULATORY_CARE_PROVIDER_SITE_OTHER): Payer: Medicare Other | Admitting: Podiatry

## 2014-01-02 DIAGNOSIS — L600 Ingrowing nail: Secondary | ICD-10-CM

## 2014-01-02 NOTE — Progress Notes (Signed)
Patient ID: Henry SproutWilliam Wyatt, male   DOB: July 24, 1940, 73 y.o.   MRN: [id number]  Subjective: Patient seated in a wheelchair presents with his wife with limited ability to respond verbally  Objective: Compression hose lower extremities bilaterally The toenails are incurvated with normal texture 6-10  Assessment: Incurvated non-dystrophic toenails 6-10  Plan: Debrided toenails 10 without a bleeding  Return intervals recommended at 4 months

## 2014-04-17 ENCOUNTER — Inpatient Hospital Stay (HOSPITAL_COMMUNITY): Payer: Medicare Other

## 2014-04-17 ENCOUNTER — Inpatient Hospital Stay (HOSPITAL_COMMUNITY)
Admission: EM | Admit: 2014-04-17 | Discharge: 2014-04-23 | DRG: 853 | Disposition: A | Discharge status: Skilled Nursing Facility | Payer: Medicare Other | Attending: Internal Medicine | Admitting: Internal Medicine

## 2014-04-17 ENCOUNTER — Encounter (HOSPITAL_COMMUNITY): Payer: Self-pay

## 2014-04-17 ENCOUNTER — Emergency Department (HOSPITAL_COMMUNITY): Payer: Medicare Other

## 2014-04-17 ENCOUNTER — Other Ambulatory Visit (HOSPITAL_COMMUNITY): Payer: Self-pay

## 2014-04-17 DIAGNOSIS — G20 Parkinson's disease: Secondary | ICD-10-CM | POA: Diagnosis present

## 2014-04-17 DIAGNOSIS — N10 Acute tubulo-interstitial nephritis: Secondary | ICD-10-CM | POA: Diagnosis not present

## 2014-04-17 DIAGNOSIS — N390 Urinary tract infection, site not specified: Secondary | ICD-10-CM | POA: Diagnosis present

## 2014-04-17 DIAGNOSIS — G9340 Encephalopathy, unspecified: Secondary | ICD-10-CM | POA: Diagnosis present

## 2014-04-17 DIAGNOSIS — E1122 Type 2 diabetes mellitus with diabetic chronic kidney disease: Secondary | ICD-10-CM | POA: Diagnosis present

## 2014-04-17 DIAGNOSIS — I509 Heart failure, unspecified: Secondary | ICD-10-CM | POA: Diagnosis not present

## 2014-04-17 DIAGNOSIS — N183 Chronic kidney disease, stage 3 unspecified: Secondary | ICD-10-CM | POA: Diagnosis present

## 2014-04-17 DIAGNOSIS — F0390 Unspecified dementia without behavioral disturbance: Secondary | ICD-10-CM | POA: Diagnosis present

## 2014-04-17 DIAGNOSIS — E1129 Type 2 diabetes mellitus with other diabetic kidney complication: Secondary | ICD-10-CM | POA: Diagnosis present

## 2014-04-17 DIAGNOSIS — Z79899 Other long term (current) drug therapy: Secondary | ICD-10-CM | POA: Diagnosis not present

## 2014-04-17 DIAGNOSIS — N201 Calculus of ureter: Secondary | ICD-10-CM | POA: Diagnosis present

## 2014-04-17 DIAGNOSIS — Z880 Allergy status to penicillin: Secondary | ICD-10-CM | POA: Diagnosis not present

## 2014-04-17 DIAGNOSIS — N1330 Unspecified hydronephrosis: Secondary | ICD-10-CM | POA: Diagnosis not present

## 2014-04-17 DIAGNOSIS — Z1623 Resistance to quinolones and fluoroquinolones: Secondary | ICD-10-CM | POA: Diagnosis present

## 2014-04-17 DIAGNOSIS — I5042 Chronic combined systolic (congestive) and diastolic (congestive) heart failure: Secondary | ICD-10-CM | POA: Diagnosis present

## 2014-04-17 DIAGNOSIS — E860 Dehydration: Secondary | ICD-10-CM | POA: Diagnosis present

## 2014-04-17 DIAGNOSIS — Z87891 Personal history of nicotine dependence: Secondary | ICD-10-CM

## 2014-04-17 DIAGNOSIS — A419 Sepsis, unspecified organism: Secondary | ICD-10-CM

## 2014-04-17 DIAGNOSIS — I129 Hypertensive chronic kidney disease with stage 1 through stage 4 chronic kidney disease, or unspecified chronic kidney disease: Secondary | ICD-10-CM | POA: Diagnosis present

## 2014-04-17 DIAGNOSIS — G20A1 Parkinson's disease without dyskinesia, without mention of fluctuations: Secondary | ICD-10-CM | POA: Diagnosis present

## 2014-04-17 DIAGNOSIS — N189 Chronic kidney disease, unspecified: Secondary | ICD-10-CM | POA: Diagnosis not present

## 2014-04-17 DIAGNOSIS — I5032 Chronic diastolic (congestive) heart failure: Secondary | ICD-10-CM | POA: Diagnosis present

## 2014-04-17 DIAGNOSIS — A4159 Other Gram-negative sepsis: Principal | ICD-10-CM | POA: Diagnosis present

## 2014-04-17 DIAGNOSIS — N202 Calculus of kidney with calculus of ureter: Secondary | ICD-10-CM | POA: Diagnosis present

## 2014-04-17 DIAGNOSIS — N179 Acute kidney failure, unspecified: Secondary | ICD-10-CM | POA: Diagnosis present

## 2014-04-17 DIAGNOSIS — I252 Old myocardial infarction: Secondary | ICD-10-CM

## 2014-04-17 DIAGNOSIS — Z794 Long term (current) use of insulin: Secondary | ICD-10-CM

## 2014-04-17 DIAGNOSIS — N132 Hydronephrosis with renal and ureteral calculous obstruction: Secondary | ICD-10-CM

## 2014-04-17 DIAGNOSIS — N136 Pyonephrosis: Secondary | ICD-10-CM | POA: Diagnosis present

## 2014-04-17 DIAGNOSIS — Z881 Allergy status to other antibiotic agents status: Secondary | ICD-10-CM

## 2014-04-17 DIAGNOSIS — I5041 Acute combined systolic (congestive) and diastolic (congestive) heart failure: Secondary | ICD-10-CM

## 2014-04-17 DIAGNOSIS — E119 Type 2 diabetes mellitus without complications: Secondary | ICD-10-CM | POA: Diagnosis present

## 2014-04-17 DIAGNOSIS — Z66 Do not resuscitate: Secondary | ICD-10-CM | POA: Diagnosis present

## 2014-04-17 DIAGNOSIS — G9341 Metabolic encephalopathy: Secondary | ICD-10-CM | POA: Diagnosis present

## 2014-04-17 DIAGNOSIS — I5043 Acute on chronic combined systolic (congestive) and diastolic (congestive) heart failure: Secondary | ICD-10-CM | POA: Diagnosis present

## 2014-04-17 DIAGNOSIS — Z882 Allergy status to sulfonamides status: Secondary | ICD-10-CM | POA: Diagnosis not present

## 2014-04-17 LAB — COMPREHENSIVE METABOLIC PANEL
ALK PHOS: 86 U/L (ref 39–117)
ALT: 12 U/L (ref 0–53)
AST: 15 U/L (ref 0–37)
Albumin: 2.8 g/dL — ABNORMAL LOW (ref 3.5–5.2)
Anion gap: 11 (ref 5–15)
BUN: 51 mg/dL — ABNORMAL HIGH (ref 6–23)
CO2: 22 mmol/L (ref 19–32)
Calcium: 8.6 mg/dL (ref 8.4–10.5)
Chloride: 104 mmol/L (ref 96–112)
Creatinine, Ser: 3.14 mg/dL — ABNORMAL HIGH (ref 0.50–1.35)
GFR, EST AFRICAN AMERICAN: 21 mL/min — AB (ref >90)
GFR, EST NON AFRICAN AMERICAN: 18 mL/min — AB (ref >90)
GLUCOSE: 188 mg/dL — AB (ref 70–99)
Potassium: 4.3 mmol/L (ref 3.5–5.1)
Sodium: 137 mmol/L (ref 135–145)
Total Bilirubin: 0.9 mg/dL (ref 0.3–1.2)
Total Protein: 7 g/dL (ref 6.0–8.3)

## 2014-04-17 LAB — CBC WITH DIFFERENTIAL/PLATELET
BASOS ABS: 0 10*3/uL (ref 0.0–0.1)
BASOS PCT: 0 % (ref 0–1)
Eosinophils Absolute: 0.2 10*3/uL (ref 0.0–0.7)
Eosinophils Relative: 2 % (ref 0–5)
HCT: 37.8 % — ABNORMAL LOW (ref 39.0–52.0)
Hemoglobin: 12.2 g/dL — ABNORMAL LOW (ref 13.0–17.0)
Lymphocytes Relative: 15 % (ref 12–46)
Lymphs Abs: 1.8 10*3/uL (ref 0.7–4.0)
MCH: 29.5 pg (ref 26.0–34.0)
MCHC: 32.3 g/dL (ref 30.0–36.0)
MCV: 91.5 fL (ref 78.0–100.0)
Monocytes Absolute: 1.2 10*3/uL — ABNORMAL HIGH (ref 0.1–1.0)
Monocytes Relative: 11 % (ref 3–12)
NEUTROS PCT: 72 % (ref 43–77)
Neutro Abs: 8.2 10*3/uL — ABNORMAL HIGH (ref 1.7–7.7)
Platelets: 201 10*3/uL (ref 150–400)
RBC: 4.13 MIL/uL — ABNORMAL LOW (ref 4.22–5.81)
RDW: 13.3 % (ref 11.5–15.5)
WBC: 11.4 10*3/uL — ABNORMAL HIGH (ref 4.0–10.5)

## 2014-04-17 LAB — APTT: aPTT: 36 seconds (ref 24–37)

## 2014-04-17 LAB — URINALYSIS, ROUTINE W REFLEX MICROSCOPIC
Bilirubin Urine: NEGATIVE
Glucose, UA: NEGATIVE mg/dL
Ketones, ur: NEGATIVE mg/dL
Nitrite: NEGATIVE
Protein, ur: >300 mg/dL — AB
Specific Gravity, Urine: 1.018 (ref 1.005–1.030)
Urobilinogen, UA: 1 mg/dL (ref 0.0–1.0)
pH: 6 (ref 5.0–8.0)

## 2014-04-17 LAB — GLUCOSE, CAPILLARY
Glucose-Capillary: 152 mg/dL — ABNORMAL HIGH (ref 70–99)
Glucose-Capillary: 122 mg/dL — ABNORMAL HIGH (ref 70–99)

## 2014-04-17 LAB — PROTIME-INR
INR: 1.28 (ref 0.00–1.49)
Prothrombin Time: 16.1 seconds — ABNORMAL HIGH (ref 11.6–15.2)

## 2014-04-17 LAB — URINE MICROSCOPIC-ADD ON

## 2014-04-17 LAB — I-STAT CG4 LACTIC ACID, ED
LACTIC ACID, VENOUS: 1.31 mmol/L (ref 0.5–2.0)
LACTIC ACID, VENOUS: 1.32 mmol/L (ref 0.5–2.0)

## 2014-04-17 LAB — BRAIN NATRIURETIC PEPTIDE: B NATRIURETIC PEPTIDE 5: 359.5 pg/mL — AB (ref 0.0–100.0)

## 2014-04-17 LAB — MRSA PCR SCREENING: MRSA by PCR: NEGATIVE

## 2014-04-17 LAB — TROPONIN I

## 2014-04-17 MED ORDER — INSULIN ASPART 100 UNIT/ML ~~LOC~~ SOLN
0.0000 [IU] | SUBCUTANEOUS | Status: DC
  Administered 2014-04-17: 2 [IU] via SUBCUTANEOUS
  Administered 2014-04-18 (×5): 1 [IU] via SUBCUTANEOUS

## 2014-04-17 MED ORDER — SODIUM CHLORIDE 0.9 % IJ SOLN
3.0000 mL | Freq: Two times a day (BID) | INTRAMUSCULAR | Status: DC
  Administered 2014-04-18 – 2014-04-19 (×2): 3 mL via INTRAVENOUS

## 2014-04-17 MED ORDER — HYDRALAZINE HCL 20 MG/ML IJ SOLN
5.0000 mg | INTRAMUSCULAR | Status: DC | PRN
  Administered 2014-04-17 – 2014-04-22 (×6): 5 mg via INTRAVENOUS
  Filled 2014-04-17 (×6): qty 1

## 2014-04-17 MED ORDER — CIPROFLOXACIN IN D5W 400 MG/200ML IV SOLN
400.0000 mg | Freq: Once | INTRAVENOUS | Status: AC
  Administered 2014-04-17: 400 mg via INTRAVENOUS
  Filled 2014-04-17: qty 200

## 2014-04-17 MED ORDER — MEMANTINE HCL ER 28 MG PO CP24
28.0000 mg | ORAL_CAPSULE | Freq: Every day | ORAL | Status: DC
  Administered 2014-04-18 – 2014-04-23 (×6): 28 mg via ORAL
  Filled 2014-04-17 (×6): qty 1

## 2014-04-17 MED ORDER — ONDANSETRON HCL 4 MG PO TABS: 4.0000 mg | ORAL_TABLET | Freq: Four times a day (QID) | ORAL | Status: DC | PRN

## 2014-04-17 MED ORDER — VANCOMYCIN HCL IN DEXTROSE 1-5 GM/200ML-% IV SOLN
1000.0000 mg | INTRAVENOUS | Status: DC
  Administered 2014-04-18: 1000 mg via INTRAVENOUS
  Filled 2014-04-17 (×2): qty 200

## 2014-04-17 MED ORDER — METOPROLOL SUCCINATE ER 25 MG PO TB24
25.0000 mg | ORAL_TABLET | Freq: Every day | ORAL | Status: DC
  Administered 2014-04-18 – 2014-04-22 (×5): 25 mg via ORAL
  Filled 2014-04-17 (×5): qty 1

## 2014-04-17 MED ORDER — SODIUM CHLORIDE 0.9 % IV SOLN: 250.0000 mL | INTRAVENOUS | Status: DC | PRN

## 2014-04-17 MED ORDER — SODIUM CHLORIDE 0.9 % IV BOLUS (SEPSIS)
500.0000 mL | Freq: Once | INTRAVENOUS | Status: DC
Start: 2014-04-17 — End: 2014-04-17

## 2014-04-17 MED ORDER — CIPROFLOXACIN IN D5W 400 MG/200ML IV SOLN: 400.0000 mg | INTRAVENOUS | Status: DC

## 2014-04-17 MED ORDER — MINERAL OIL PO OIL
0.1000 mL | TOPICAL_OIL | Freq: Every day | ORAL | Status: DC
  Administered 2014-04-18 – 2014-04-23 (×6): 0.1 mL via OTIC
  Filled 2014-04-17 (×16): qty 30

## 2014-04-17 MED ORDER — ROPINIROLE HCL 0.5 MG PO TABS
0.5000 mg | ORAL_TABLET | Freq: Three times a day (TID) | ORAL | Status: DC
  Administered 2014-04-18 – 2014-04-23 (×16): 0.5 mg via ORAL
  Filled 2014-04-17 (×20): qty 1

## 2014-04-17 MED ORDER — FENTANYL CITRATE 0.05 MG/ML IJ SOLN
INTRAMUSCULAR | Status: AC | PRN
  Administered 2014-04-17: 50 ug via INTRAVENOUS

## 2014-04-17 MED ORDER — SODIUM CHLORIDE 0.9 % IV BOLUS (SEPSIS)
1000.0000 mL | Freq: Once | INTRAVENOUS | Status: AC
  Administered 2014-04-17: 1000 mL via INTRAVENOUS

## 2014-04-17 MED ORDER — TRAMADOL HCL 50 MG PO TABS: 50.0000 mg | ORAL_TABLET | Freq: Two times a day (BID) | ORAL | Status: DC | PRN

## 2014-04-17 MED ORDER — DEXTROSE 5 % IV SOLN
1.0000 g | Freq: Once | INTRAVENOUS | Status: AC
  Administered 2014-04-17: 1 g via INTRAVENOUS
  Filled 2014-04-17: qty 1

## 2014-04-17 MED ORDER — SODIUM CHLORIDE 0.9 % IJ SOLN: 3.0000 mL | INTRAMUSCULAR | Status: DC | PRN

## 2014-04-17 MED ORDER — INSULIN DETEMIR 100 UNIT/ML ~~LOC~~ SOLN
4.0000 [IU] | Freq: Every day | SUBCUTANEOUS | Status: DC
  Administered 2014-04-17 – 2014-04-21 (×5): 4 [IU] via SUBCUTANEOUS
  Filled 2014-04-17 (×7): qty 0.04

## 2014-04-17 MED ORDER — ACETAMINOPHEN 500 MG PO TABS
1000.0000 mg | ORAL_TABLET | Freq: Three times a day (TID) | ORAL | Status: DC | PRN
  Administered 2014-04-18: 1000 mg via ORAL
  Filled 2014-04-17: qty 2

## 2014-04-17 MED ORDER — ALUM & MAG HYDROXIDE-SIMETH 200-200-20 MG/5ML PO SUSP: 30.0000 mL | Freq: Four times a day (QID) | ORAL | Status: DC | PRN

## 2014-04-17 MED ORDER — SODIUM CHLORIDE 0.9 % IJ SOLN
3.0000 mL | Freq: Two times a day (BID) | INTRAMUSCULAR | Status: DC
  Administered 2014-04-18: 3 mL via INTRAVENOUS

## 2014-04-17 MED ORDER — FENTANYL CITRATE 0.05 MG/ML IJ SOLN
INTRAMUSCULAR | Status: AC
  Filled 2014-04-17: qty 2

## 2014-04-17 MED ORDER — AZTREONAM 2 G IJ SOLR
2.0000 g | Freq: Once | INTRAMUSCULAR | Status: DC
  Filled 2014-04-17: qty 2

## 2014-04-17 MED ORDER — DEXTROSE 5 % IV SOLN
1.0000 g | Freq: Three times a day (TID) | INTRAVENOUS | Status: DC
  Filled 2014-04-17 (×2): qty 1

## 2014-04-17 MED ORDER — VANCOMYCIN HCL 10 G IV SOLR
1500.0000 mg | Freq: Once | INTRAVENOUS | Status: AC
  Administered 2014-04-17: 1500 mg via INTRAVENOUS
  Filled 2014-04-17: qty 1500

## 2014-04-17 MED ORDER — MIDAZOLAM HCL 2 MG/2ML IJ SOLN
INTRAMUSCULAR | Status: AC | PRN
  Administered 2014-04-17: 1 mg via INTRAVENOUS

## 2014-04-17 MED ORDER — CARBIDOPA-LEVODOPA 10-100 MG PO TABS
1.0000 | ORAL_TABLET | Freq: Three times a day (TID) | ORAL | Status: DC
  Administered 2014-04-18 – 2014-04-23 (×16): 1 via ORAL
  Filled 2014-04-17 (×20): qty 1

## 2014-04-17 MED ORDER — HEPARIN SODIUM (PORCINE) 5000 UNIT/ML IJ SOLN
5000.0000 [IU] | Freq: Three times a day (TID) | INTRAMUSCULAR | Status: DC
  Administered 2014-04-18 – 2014-04-23 (×16): 5000 [IU] via SUBCUTANEOUS
  Filled 2014-04-17 (×20): qty 1

## 2014-04-17 MED ORDER — BISACODYL 5 MG PO TBEC: 5.0000 mg | DELAYED_RELEASE_TABLET | Freq: Every day | ORAL | Status: DC | PRN

## 2014-04-17 MED ORDER — LEVOFLOXACIN IN D5W 750 MG/150ML IV SOLN
750.0000 mg | INTRAVENOUS | Status: DC
  Administered 2014-04-17: 750 mg via INTRAVENOUS
  Filled 2014-04-17 (×2): qty 150

## 2014-04-17 MED ORDER — LIDOCAINE HCL 1 % IJ SOLN
INTRAMUSCULAR | Status: AC
  Filled 2014-04-17: qty 20

## 2014-04-17 MED ORDER — IOHEXOL 300 MG/ML  SOLN
50.0000 mL | Freq: Once | INTRAMUSCULAR | Status: AC | PRN
  Administered 2014-04-17: 20 mL

## 2014-04-17 MED ORDER — SENNA 8.6 MG PO TABS
1.0000 | ORAL_TABLET | Freq: Every day | ORAL | Status: DC
  Administered 2014-04-18 – 2014-04-22 (×5): 8.6 mg via ORAL
  Filled 2014-04-17 (×7): qty 1

## 2014-04-17 MED ORDER — MIDAZOLAM HCL 2 MG/2ML IJ SOLN
INTRAMUSCULAR | Status: AC
  Filled 2014-04-17: qty 2

## 2014-04-17 MED ORDER — ONDANSETRON HCL 4 MG/2ML IJ SOLN: 4.0000 mg | Freq: Four times a day (QID) | INTRAMUSCULAR | Status: DC | PRN

## 2014-04-17 NOTE — Progress Notes (Signed)
Patient ID: Gwyneth SproutWilliam Keahey, male   DOB: 12/26/1940, 74 y.o.   MRN: [id number]  Contacted by Urology for emergent right percutaneous nephrostomy tube placement. Indication is renal obstruction by multiple ureteral calculi with urosepsis.  Number of stones makes ureteral stent placement from retrograde approach potentially difficult.  Consent obtained by telephone from the patient's wife, Barbee CoughMargaret Dario.  Will proceed with right percutaneous nephrostomy tube placement.

## 2014-04-17 NOTE — Progress Notes (Addendum)
ANTIBIOTIC CONSULT NOTE - INITIAL  Pharmacy Consult:  Vancomycin / Cipro / Azactam Indication:  Sepsis / UTI  Allergies  Allergen Reactions  . Carbapenems Other (See Comments)    Per nursing home [facility]  . Cephalosporins Other (See Comments)    Per nursing home mar  . Erythromycin Other (See Comments)    Per nursing home mar  . Penicillins Other (See Comments)    Per nursing home mar  . Sulfa Antibiotics Other (See Comments)    Per nursing home [facility]    Patient Measurements: Height: 6' (182.9 cm) Weight: 200 lb (90.719 kg) IBW/kg (Calculated) : 77.6  Vital Signs: Temp: 99.6 F (37.6 C) (04/11 1249) Temp Source: Rectal (04/11 1249) Pulse Rate: 84 (04/11 1255)  Labs: No results for input(s): WBC, HGB, PLT, LABCREA, CREATININE in the last 72 hours. CrCl cannot be calculated (Patient has no serum creatinine result on file.). No results for input(s): VANCOTROUGH, VANCOPEAK, VANCORANDOM, GENTTROUGH, GENTPEAK, GENTRANDOM, TOBRATROUGH, TOBRAPEAK, TOBRARND, AMIKACINPEAK, AMIKACINTROU, AMIKACIN in the last 72 hours.   Microbiology: No results found for this or any previous visit (from the past 720 hour(s)).  Medical History: Past Medical History  Diagnosis Date  . Hypertension   . Diabetes mellitus without complication   . Constipation   . DVT (deep venous thrombosis)   . Back pain   . Kidney injury   . Septic shock     01/05/13- admitted to hospital   . Diarrhea     admitted to hospital on 01/05/13   . Pneumonia     health care associated- 01/05/13   . Kidney stone     01/05/13   . Hydronephrosis     left kidney 01/05/13   . Altered mental status     01/05/13   . Acute kidney injury     01/05/13   . C. difficile diarrhea     01/05/13- admitted to hospital   . Influenza A     01/05/13 admitted to hospital   . Acute pyelonephritis     01/05/13   . Hypokalemia   . Dysphagia   . 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  Dementia       Assessment: [age] YOM presented with AMS and SOB.  Pharmacy consulted to initiate vancomycin, ciprofloxacin, and aztreonam for sepsis and UTI.  Aware patient has AKI.   Goal of Therapy:  Vancomycin trough level 15-20 mcg/ml   Plan:  - Vanc 1500mg  IV x 1, then 1gm IV Q24H - Cipro 400mg  IV x 1, then 400mg  IV Q24H - Azactam 1gm IV Q8H - Monitor renal fxn closely, clinical progress, vanc trough as indicated    Thuy D. Laney Potashang, PharmD, BCPS Pager:  [phone] 04/17/2014, 2:29 PM   Change to Levofloxacin from Ciprofloxacin and Aztreonam.  Will start Levofloxacin 750 mg IV q48h. Follow up SCr, UOP, cultures, clinical course and adjust as clinically indicated. Michiko Lineman C

## 2014-04-17 NOTE — H&P (Signed)
[facility] History and Physical                                                                                    Henry Wyatt, is a 74 y.o. male  MRN: [id number]   DOB - 1940/12/17  Admit Date - 04/17/2014  Outpatient Primary MD for the patient is Ginette OttoSTONEKING,HAL THOMAS, MD  With History of -  Past Medical History  Diagnosis Date  . Hypertension   . Diabetes mellitus without complication   . Constipation   . DVT (deep venous thrombosis)   . Back pain   . Kidney injury   . Septic shock     01/05/13- admitted to hospital   . Diarrhea     admitted to hospital on 01/05/13   . Pneumonia     health care associated- 01/05/13   . Kidney stone     01/05/13   . Hydronephrosis     left kidney 01/05/13   . Altered mental status     01/05/13   . Acute kidney injury     01/05/13   . C. difficile diarrhea     01/05/13- admitted to hospital   . Influenza A     01/05/13 admitted to hospital   . Acute pyelonephritis     01/05/13   . Hypokalemia   . Dysphagia   . 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 Dementia       Past Surgical History  Procedure Laterality Date  . Cystoscopy w/ ureteral stent placement Left 01/05/2013    Procedure: CYSTOSCOPY WITH RETROGRADE PYELOGRAM/URETERAL STENT PLACEMENT;  Surgeon: Valetta Fulleravid S Grapey, MD;  Location: [facility];  Service: Urology;  Laterality: Left;  . Back surgery    . Cystoscopy with ureteroscopy Left 01/19/2013    Procedure: CYSTOSCOPY WITH URETEROSCOPY;  Surgeon: Valetta Fulleravid S Grapey, MD;  Location: [facility];  Service: Urology;  Laterality: Left;  REMOVAL OF JJ STENT      . Holmium laser application Left 01/19/2013    Procedure: HOLMIUM LASER APPLICATION;  Surgeon: Valetta Fulleravid S Grapey, MD;  Location: [facility];  Service: Urology;  Laterality: Left;    in for   Chief Complaint  Patient presents with  . Altered Mental Status  . Shortness of Breath     HPI  Henry SproutWilliam Wyatt  is a 74 y.o. male,  with a pmh of DM, Mixed heart failure, Dementia, and HTN presents to the ER with altered mental status.  No family is at bedside and the patient is unable to provide history - so history is gathered from the EDP notes.  The patient was reported to have a decreased level of consciousness 3 days ago, but improved. Notes indicate that he has not been eating or drinking since Friday. He developed AMS again today and was brought in by EMS.  Chart review indicates that the patient had similar presentation in 2014 when he had AMS/UTI and a ureter stone.  He was stented and treated by Dr. Isabel CapriceGrapey.  In the ER today the patient is completely confused.  His temperature with EMS was 101.  Creatinine is 3.14 and wbc is 11.4.  Xray shows increased pulm. vasc.  Review of Systems   Unable to give.  Social History History  Substance Use Topics  . Smoking status: Former Games developer  . Smokeless tobacco: Not on file  . Alcohol Use: No    Family History Unable to give due to AMS  Prior to Admission medications   Medication Sig Start Date End Date Taking? Authorizing Provider  carbidopa-levodopa (SINEMET IR) 10-100 MG per tablet Take 1 tablet by mouth 3 (three) times daily.   Yes Historical Provider, MD  memantine (NAMENDA XR) 28 MG CP24 24 hr capsule Take 28 mg by mouth daily.   Yes Historical Provider, MD  metoprolol succinate (TOPROL-XL) 25 MG 24 hr tablet Take 25 mg by mouth daily.  01/13/13  Yes Penny Pia, MD  senna (SENOKOT) 8.6 MG TABS tablet Take 1 tablet by mouth at bedtime.   Yes Historical Provider, MD  acetaminophen (TYLENOL) 500 MG tablet Take 1,000 mg by mouth every 8 (eight) hours as needed for mild pain.    Historical Provider, MD  bisacodyl (DULCOLAX) 5 MG EC tablet Take 5-10 mg by mouth daily as needed for moderate constipation.    Historical Provider, MD  LEVEMIR 100 UNIT/ML injection Inject 4 Units into the skin daily.  06/24/13   Historical Provider, MD  mineral oil liquid Place 0.1 mLs into  both ears daily.    Historical Provider, MD  nitroGLYCERIN (NITROSTAT) 0.4 MG SL tablet Place 0.4 mg under the tongue every 5 (five) minutes as needed for chest pain.    Historical Provider, MD  rOPINIRole (REQUIP) 0.5 MG tablet Take 0.5 mg by mouth 3 (three) times daily.    Historical Provider, MD  traMADol (ULTRAM) 50 MG tablet Take 100 mg by mouth 2 (two) times daily as needed.    Historical Provider, MD    Allergies  Allergen Reactions  . Carbapenems Other (See Comments)    Per [facility]  . Cephalosporins Other (See Comments)    Per [facility]  . Erythromycin Other (See Comments)    Per [facility]  . Penicillins Other (See Comments)    Per [facility]  . Sulfa Antibiotics Other (See Comments)    Per [facility]    Physical Exam  Vitals  Blood pressure 158/90, pulse 85, temperature 99.6 F (37.6 C), temperature source Rectal, resp. rate 17, height 6' (1.829 m), weight 90.719 kg (200 lb), SpO2 97 %.   General:  Wd, Obese, male, lying in bed in NAD, disorientated.  Alert to person and year.  Neuro:   Followed commands, retracted from pain.  Responded to questions with nonsensical answers.  ENT:  Ears and Eyes appear Normal, Conjunctivae clear, PER. Moist oral mucosa without erythema or exudates.  Neck:  Supple, No lymphadenopathy appreciated  Respiratory:  Symmetrical chest wall movement, Good air movement bilaterally, CTAB.  Audible wheeze from throat/mouth.  Cardiac:  RRR, No Murmurs, 1-2+ pitting in LE bilaterally  Abdomen:  Positive bowel sounds, Soft, Non tender, Non distended,  Small umbilical hernia  Skin:  No Cyanosis, Normal Skin Turgor, No Skin Rash or Bruise.   Extremities:  Able to move all 4. 5/5 strength in each,    Data Review  CBC  Recent Labs Lab 04/17/14 1315  WBC 11.4*  HGB 12.2*  HCT 37.8*  PLT 201  MCV 91.5  MCH 29.5  MCHC 32.3  RDW 13.3  LYMPHSABS 1.8  MONOABS 1.2*  EOSABS 0.2  BASOSABS 0.0     Chemistries   Recent Labs Lab 04/17/14 1315  NA 137  K 4.3  CL 104  CO2 22  GLUCOSE 188*  BUN 51*  CREATININE 3.14*  CALCIUM 8.6  AST 15  ALT 12  ALKPHOS 86  BILITOT 0.9    Urinalysis    Component Value Date/Time   COLORURINE YELLOW 04/17/2014 1259   APPEARANCEUR TURBID* 04/17/2014 1259   LABSPEC 1.018 04/17/2014 1259   PHURINE 6.0 04/17/2014 1259   GLUCOSEU NEGATIVE 04/17/2014 1259   HGBUR MODERATE* 04/17/2014 1259   BILIRUBINUR NEGATIVE 04/17/2014 1259   KETONESUR NEGATIVE 04/17/2014 1259   PROTEINUR >300* 04/17/2014 1259   UROBILINOGEN 1.0 04/17/2014 1259   NITRITE NEGATIVE 04/17/2014 1259   LEUKOCYTESUR LARGE* 04/17/2014 1259    Imaging results:   Dg Chest Port 1 View  04/17/2014   CLINICAL DATA:  Altered mental status.  EXAM: PORTABLE CHEST - 1 VIEW  COMPARISON:  Single view of the chest 01/06/2013  FINDINGS: Lung volumes are low with mild basilar atelectasis. Small pleural effusion is noted. Heart size is upper normal with vascular congestion identified. No pneumothorax.  IMPRESSION: Pulmonary vascular congestion without frank edema. Very small left effusion is noted.   Electronically Signed   By: Drusilla Kanner M.D.   On: 04/17/2014 13:37    My personal review of EKG: pending.   Assessment & Plan  Principal Problem:   Acute encephalopathy Active Problems:   DM (diabetes mellitus), type 2 with renal complications   CKD (chronic kidney disease)   Acute pyelonephritis   Acute combined systolic and diastolic congestive heart failure   Acute encephalopathy Secondary to urinary tract infection with likely pyelonephritis. Blood cultures are pending, urine cultures are pending. Patient will be admitted to stepdown. He is a fall risk.  Pyelonephritis Patient has multiple drug allergies Was started on vancomycin, Cipro, and aztreonam by the EDP.  Will continue these and quickly narrow based on culture results In 2014 the patient had a very similar  presentation and was found to have a ureter stone. Checking bladder scan. Strict intake and output. Check CT abdomen/pelvis without contrast.  Acute on chronic kidney failure Creatinine 3.14, baseline is 1.39. Not on nephrotoxic medications. Will monitor bmet.  Mixed heart failure, acute on chronic Patient has a very vascular congestion on chest x-ray Bilateral lower extremity edema. Crackles on exam Checking BNP, and EKG, and troponin 1. Repeat 2-D echo. Previous echo in January 2015 showed LVEF of 35% with hypokinesis and grade 2 diastolic dysfunction. Will give one dose of 40 mg of IV Lasix if CT is negative for obstruction.  Diabetes Mellitus Continue home dose Levemir (4 units?) Add sliding scale insulin Checking hemoglobin A1c.  Dementia Uncertain of baseline. Need to discuss with family.  Patient currently needs close supervision due to confusion.   DVT Prophylaxis: Heparin   AM Labs Ordered, also please review Full Orders  Family Communication:    none at bedside. EDP spoke with wife.   Code Status:   DO NOT RESUSCITATE  Condition:   guarded   Time spent in minutes : 60   Algis Downs,  PA-C on 04/17/2014 at 3:39 PM  Between 7am to 7pm - Pager - [phone]  After 7pm go to [email or web address] - password [email or web address]  And look for the night coverage person covering me after hours  [facility]

## 2014-04-17 NOTE — ED Notes (Signed)
Per EMS - pt began having AMS and some shortness of breath on Friday; began to improve over weekend but has declined since. EMS was called for decreased LOC and CVA r/o. Pt oriented at baseline, grips strong, refusing treatments/testing. CBG 254. Temp 101.1

## 2014-04-17 NOTE — Consult Note (Signed)
Urology Consult   Physician requesting consult: Dr. Elvera Lennox, MD  Reason for consult: Right ureteral stone  History of Present Illness: Henry Wyatt is a 74 y.o. male with history significant history for diastolic/systolic heart failure, dementia, diabetes, prior DVT, and history of nephrolithiasis in the past (most recently 12/2012) who presented to ED with several days of waxing and waning mental status and anorexia. His presentation was similar to his presentation in 12/2012 when he had urosepsis from a left ureteral stone that was emergently stented and then treated with laser lithotripsy on 01/19/2013.   He had a low grade temp to 101 in the ambulance. His temp was 99.6 in the ER. His vitals are all stable. He was given levaquin and vancomycin in ER. His mentation is poor.   History is obtained from medical record and nursing staff due to his mental status and lack of family at bedside.   Past Medical History  Diagnosis Date  . Hypertension   . Diabetes mellitus without complication   . Constipation   . DVT (deep venous thrombosis)   . Back pain   . Kidney injury   . Septic shock     01/05/13- admitted to hospital   . Diarrhea     admitted to hospital on 01/05/13   . Pneumonia     health care associated- 01/05/13   . Kidney stone     01/05/13   . Hydronephrosis     left kidney 01/05/13   . Altered mental status     01/05/13   . Acute kidney injury     01/05/13   . C. difficile diarrhea     01/05/13- admitted to hospital   . Influenza A     01/05/13 admitted to hospital   . Acute pyelonephritis     01/05/13   . Hypokalemia   . Dysphagia   . 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 Dementia     Past Surgical History  Procedure Laterality Date  . Cystoscopy w/ ureteral stent placement Left 01/05/2013    Procedure: CYSTOSCOPY WITH RETROGRADE PYELOGRAM/URETERAL STENT PLACEMENT;  Surgeon: Valetta Fuller, MD;  Location: [facility];  Service: Urology;  Laterality: Left;  . Back surgery    . Cystoscopy with ureteroscopy Left 01/19/2013    Procedure: CYSTOSCOPY WITH URETEROSCOPY;  Surgeon: Valetta Fuller, MD;  Location: [facility];  Service: Urology;  Laterality: Left;  REMOVAL OF JJ STENT      . Holmium laser application Left 01/19/2013    Procedure: HOLMIUM LASER APPLICATION;  Surgeon: Valetta Fuller, MD;  Location: [facility];  Service: Urology;  Laterality: Left;    Medications:  Home meds:    Medication List    ASK your doctor about these medications        acetaminophen 500 MG tablet  Commonly known as:  TYLENOL  Take 1,000 mg by mouth every 8 (eight) hours as needed for mild pain.     bisacodyl 5 MG EC tablet  Commonly known as:  DULCOLAX  Take 5-10 mg by mouth daily as needed for moderate constipation.     carbidopa-levodopa 10-100 MG per tablet  Commonly known as:  SINEMET IR  Take 1 tablet by mouth 3 (three) times daily.     LEVEMIR 100 UNIT/ML injection  Generic drug:  insulin detemir  Inject 4 Units into the skin daily.     memantine 28 MG Cp24  24 hr capsule  Commonly known as:  NAMENDA XR  Take 28 mg by mouth daily.     metoprolol succinate 25 MG 24 hr tablet  Commonly known as:  TOPROL-XL  Take 25 mg by mouth daily.     mineral oil liquid  Place 0.1 mLs into both ears daily.     nitroGLYCERIN 0.4 MG SL tablet  Commonly known as:  NITROSTAT  Place 0.4 mg under the tongue every 5 (five) minutes as needed for chest pain.     rOPINIRole 0.5 MG tablet  Commonly known as:  REQUIP  Take 0.5 mg by mouth 3 (three) times daily.     senna 8.6 MG Tabs tablet  Commonly known as:  SENOKOT  Take 1 tablet by mouth at bedtime.     traMADol 50 MG tablet  Commonly known as:  ULTRAM  Take 100 mg by mouth 2 (two) times daily as needed.        Scheduled Meds: . carbidopa-levodopa  1 tablet Oral TID  . heparin  5,000 Units Subcutaneous 3 times per day  . insulin aspart  0-9 Units Subcutaneous  6 times per day  . insulin detemir  4 Units Subcutaneous QHS  . levofloxacin (LEVAQUIN) IV  750 mg Intravenous Q48H  . [START ON 04/18/2014] memantine  28 mg Oral Daily  . [START ON 04/18/2014] metoprolol succinate  25 mg Oral Daily  . [START ON 04/18/2014] mineral oil  0.1 mL Otic Daily  . rOPINIRole  0.5 mg Oral TID  . senna  1 tablet Oral QHS  . sodium chloride  3 mL Intravenous Q12H  . sodium chloride  3 mL Intravenous Q12H  . vancomycin  1,500 mg Intravenous Once  . [START ON 04/18/2014] vancomycin  1,000 mg Intravenous Q24H   Continuous Infusions:  PRN Meds:.sodium chloride, acetaminophen, alum & mag hydroxide-simeth, bisacodyl, ondansetron **OR** ondansetron (ZOFRAN) IV, sodium chloride, traMADol  Allergies:  Allergies  Allergen Reactions  . Carbapenems Other (See Comments)    Per [facility]  . Cephalosporins Other (See Comments)    Per [facility]  . Erythromycin Other (See Comments)    Per [facility]  . Penicillins Other (See Comments)    Per [facility]  . Sulfa Antibiotics Other (See Comments)    Per [facility]    History reviewed. No pertinent family history.  Social History:  reports that he has quit smoking. He does not have any smokeless tobacco history on file. He reports that he does not drink alcohol or use illicit drugs.  ROS: A complete review of systems was performed.  All systems are negative except for pertinent findings as noted.  Physical Exam:  Vital signs in last 24 hours: Temp:  [99.6 F (37.6 C)] 99.6 F (37.6 C) (04/11 1249) Pulse Rate:  [74-85] 74 (04/11 1715) Resp:  [14-25] 19 (04/11 1715) BP: (118-183)/(70-135) 137/70 mmHg (04/11 1715) SpO2:  [92 %-97 %] 95 % (04/11 1715) Weight:  [200 lb (90.719 kg)] 200 lb (90.719 kg) (04/11 1324) Constitutional:  Arousable but not able to interact in any meaningful way. Cardiovascular: Regular rate and rhythm. Respiratory: Normal respiratory effort, Lungs clear  bilaterally GI: Abdomen is soft, nontender, nondistended, no abdominal masses Genitourinary: No CVAT.  Neurologic: Grossly intact, no focal deficits Psychiatric: Altered  Laboratory Data:   Recent Labs  04/17/14 1315  WBC 11.4*  HGB 12.2*  HCT 37.8*  PLT 201     Recent Labs  04/17/14 1315  NA 137  K 4.3  CL 104  GLUCOSE 188*  BUN 51*  CALCIUM 8.6  CREATININE 3.14*  Cr 01/12/13 - 1.4  Urinalysis    Component Value Date/Time   COLORURINE YELLOW 04/17/2014 1259   APPEARANCEUR TURBID* 04/17/2014 1259   LABSPEC 1.018 04/17/2014 1259   PHURINE 6.0 04/17/2014 1259   GLUCOSEU NEGATIVE 04/17/2014 1259   HGBUR MODERATE* 04/17/2014 1259   BILIRUBINUR NEGATIVE 04/17/2014 1259   KETONESUR NEGATIVE 04/17/2014 1259   PROTEINUR >300* 04/17/2014 1259   UROBILINOGEN 1.0 04/17/2014 1259   NITRITE NEGATIVE 04/17/2014 1259   LEUKOCYTESUR LARGE* 04/17/2014 1259   Radiologic Imaging: Ct Abdomen Pelvis Wo Contrast  04/17/2014   CLINICAL DATA:  Code sepsis, acute renal injury, hydronephrosis  EXAM: CT ABDOMEN AND PELVIS WITHOUT CONTRAST  TECHNIQUE: Multidetector CT imaging of the abdomen and pelvis was performed following the standard protocol without IV contrast.  COMPARISON:  01/05/2013  FINDINGS: Lower chest: Images are degraded by patient motion. Probable minimal dependent bibasilar atelectasis is identified, suboptimally visualized.  Hepatobiliary: Extensive streak artifact due to patient's arms and motion. Unenhanced liver is normal in appearance. Gallbladder is normal.  Pancreas: Partly fatty infiltrated, normal.  Spleen: Normal  Adrenals/Urinary Tract: Left adrenal adenoma reidentified, suboptimally visualized due to motion. Bilateral radiopaque renal calculi are reidentified. There is severe right hydroureteronephrosis to the level of the 1.3 cm mid right ureteral calculus image 43. Ureters are otherwise decompressed without other radiopaque ureteral calculus identified. Trace  perinephric stranding. Bladder is decompressed and unremarkable.  Stomach/Bowel: No bowel wall thickening or focal segmental dilatation is identified. A few colonic tics are incidentally noted. Normal appendix. Bowel is contained within otherwise fat containing ventral abdominal wall hernia without evidence for obstruction or entrapment at this time. Moderate stool impaction within the rectum.  Vascular/Lymphatic: Moderate atheromatous aortic calcification without aneurysm. No lymphadenopathy.  Reproductive: Not applicable  Other: Fat containing umbilical hernia is identified. No free fluid or air.  Musculoskeletal: L5-S1 fusion hardware noted. No evidence for hardware failure. Severe multilevel disc degenerative changes identified with leftward curvature centered at L2. Evidence of previous posterior decompression L1-L2.  IMPRESSION: 1.3 cm mid right ureteral calculus producing severe right hydroureteronephrosis proximally.  Bilateral radiopaque renal calculi.  Bowel containing ventral abdominal wall hernia without evidence for obstruction.   Electronically Signed   By: Christiana Pellant M.D.   On: 04/17/2014 16:09   Dg Chest Port 1 View  04/17/2014   CLINICAL DATA:  Altered mental status.  EXAM: PORTABLE CHEST - 1 VIEW  COMPARISON:  Single view of the chest 01/06/2013  FINDINGS: Lung volumes are low with mild basilar atelectasis. Small pleural effusion is noted. Heart size is upper normal with vascular congestion identified. No pneumothorax.  IMPRESSION: Pulmonary vascular congestion without frank edema. Very small left effusion is noted.   Electronically Signed   By: Drusilla Kanner M.D.   On: 04/17/2014 13:37    I independently reviewed the above imaging studies.  Impression/Recommendation 30M with significant systolic and diastolic heart failure, dementia, diabetes, history of DVT, and nephrolithiasis who presents with a 1.3 by 3 cm ureteral stone as well as sizable renal stones and concern for  infection. Needing urgent decompression. Cardiology evaluated him and feels he would be a reasonable candidate for both general anesthesia for stent or sedation for nephrostomy tube.  1. Right ureterolithiasis and nephrolithiasis with urinary tract infection and sepsis - 1.3 x 3 cm column of soft, low-HU stone resulting in severe right  hydronephrosis, also with UA positive for UTI (LE, blood, bacteria), mild leukocytosis.   Stent would likely be technically possible, however subsequent treatment of the stone would require at least 2, if not 3 procedures ureteroscopically with prolonged OR and anesthesia time.  Alternative would be a percutaneous nephrostomy tube with subsequent percutaneous nephrolithotomy which would more more likely to render him stone free with 1 procedure under shorter anesthesia time. After discussion with Dr. Mena GoesEskridge, we feel this is the best option for both acute management of sepsis and subsequent stone management.  2. Acute kidney injury - likely due to obstruction from the stone as well as dehydration and sepsis

## 2014-04-17 NOTE — ED Notes (Signed)
Bladder scanned pt, scanner only showed 50 mL.

## 2014-04-17 NOTE — Consult Note (Signed)
CARDIOLOGY CONSULT NOTE   Patient ID: Henry Wyatt MRN: [id number], DOB/AGE: 06/30/1940   Admit date: 04/17/2014 Date of Consult: 04/17/2014   Primary Physician: Ginette Otto, MD Primary Cardiologist: None  Pt. Profile  Elderly white male admitted with fever and acute kidney failure secondary to right ureteral stone with hydronephrosis.  Evaluation for possible surgery is requested.  Problem List  Past Medical History  Diagnosis Date  . Hypertension   . Diabetes mellitus without complication   . Constipation   . DVT (deep venous thrombosis)   . Back pain   . Kidney injury   . Septic shock     01/05/13- admitted to hospital   . Diarrhea     admitted to hospital on 01/05/13   . Pneumonia     health care associated- 01/05/13   . Kidney stone     01/05/13   . Hydronephrosis     left kidney 01/05/13   . Altered mental status     01/05/13   . Acute kidney injury     01/05/13   . C. difficile diarrhea     01/05/13- admitted to hospital   . Influenza A     01/05/13 admitted to hospital   . Acute pyelonephritis     01/05/13   . Hypokalemia   . Dysphagia   . 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 Dementia     Past Surgical History  Procedure Laterality Date  . Cystoscopy w/ ureteral stent placement Left 01/05/2013    Procedure: CYSTOSCOPY WITH RETROGRADE PYELOGRAM/URETERAL STENT PLACEMENT;  Surgeon: Valetta Fuller, MD;  Location: [facility];  Service: Urology;  Laterality: Left;  . Back surgery    . Cystoscopy with ureteroscopy Left 01/19/2013    Procedure: CYSTOSCOPY WITH URETEROSCOPY;  Surgeon: Valetta Fuller, MD;  Location: [facility];  Service: Urology;  Laterality: Left;  REMOVAL OF JJ STENT      . Holmium laser application Left 01/19/2013    Procedure: HOLMIUM LASER APPLICATION;  Surgeon: Valetta Fuller, MD;  Location: [facility];  Service: Urology;  Laterality: Left;     Allergies  Allergies  Allergen  Reactions  . Carbapenems Other (See Comments)    Per nursing home [facility]  . Cephalosporins Other (See Comments)    Per nursing home [facility]  . Erythromycin Other (See Comments)    Per nursing home [facility]  . Penicillins Other (See Comments)    Per nursing home [facility]  . Sulfa Antibiotics Other (See Comments)    Per nursing home [facility]    HPI   This 74 year old gentleman is admitted with fever and right ureteral obstruction with hydronephrosis.  He is confused.  He is unable to give any history.  He is disoriented.  No family is present to interview. Review of his chart reveals that he had an echocardiogram in January 2015 which showed left ventricular ejection fraction of 35% with evidence of an old inferior wall myocardial infarction and inferior wall hypokinesis.  There was grade 2 diastolic dysfunction.  The patient has bilateral lower extremity edema.  He is unable to tell us how long he has had edema.  He has not been on diuretics at home.  Home cardiac medications are Toprol-XL 25 mg one daily.  The patient is also on insulin and is on medication for parkinsonism and for memory disorder.  The patient denies any recent chest pain or shortness of breath  but his history is not reliable  Inpatient Medications  . carbidopa-levodopa  1 tablet Oral TID  . heparin  5,000 Units Subcutaneous 3 times per day  . insulin aspart  0-9 Units Subcutaneous 6 times per day  . insulin detemir  4 Units Subcutaneous QHS  . levofloxacin (LEVAQUIN) IV  750 mg Intravenous Q48H  . [START ON 04/18/2014] memantine  28 mg Oral Daily  . [START ON 04/18/2014] metoprolol succinate  25 mg Oral Daily  . [START ON 04/18/2014] mineral oil  0.1 mL Otic Daily  . rOPINIRole  0.5 mg Oral TID  . senna  1 tablet Oral QHS  . sodium chloride  3 mL Intravenous Q12H  . sodium chloride  3 mL Intravenous Q12H  . [START ON 04/18/2014] vancomycin  1,000 mg Intravenous Q24H    Family History History reviewed. No pertinent family history.    Social History History   Social History  . Marital Status: Married    Spouse Name: N/A  . Number of Children: N/A  . Years of Education: N/A   Occupational History  . Not on file.   Social History Main Topics  . Smoking status: Former Games developer  . Smokeless tobacco: Not on file  . Alcohol Use: No  . Drug Use: No  . Sexual Activity: Not on file   Other Topics Concern  . Not on file   Social History Narrative     Review of Systems not currently obtainable because of mental status the patient    Physical Exam  Blood pressure 155/83, pulse 81, temperature 98.8 F (37.1 C), temperature source Axillary, resp. rate 14, height 6' (1.829 m), weight 244 lb 14.9 oz (111.1 kg), SpO2 95 %.  General: Confused, hallucinating, he is not agitated however  Neuro: Marland Kitchen Moves all extremities spontaneously. HEENT: Normal  Neck: Supple without bruits .  Mild JVD Lungs:  Resp regular and unlabored, CTA.  Minimal basilar rales.  No wheezing. Heart: RRR no s3, s4, or murmurs. Abdomen: Soft, non-tender, non-distended, BS + x 4.  Extremities: 2+ pretibial and ankle edema  Labs  No results for input(s): CKTOTAL, CKMB, TROPONINI in the last 72 hours. Lab Results  Component Value Date   WBC 11.4* 04/17/2014   HGB 12.2* 04/17/2014   HCT 37.8* 04/17/2014   MCV 91.5 04/17/2014   PLT 201 04/17/2014     Recent Labs Lab 04/17/14 1315  NA 137  K 4.3  CL 104  CO2 22  BUN 51*  CREATININE 3.14*  CALCIUM 8.6  PROT 7.0  BILITOT 0.9  ALKPHOS 86  ALT 12  AST 15  GLUCOSE 188*   No results found for: CHOL, HDL, LDLCALC, TRIG No results found for: DDIMER  Radiology/Studies  Ct Abdomen Pelvis Wo Contrast  04/17/2014   CLINICAL DATA:  Code sepsis, acute renal injury, hydronephrosis  EXAM: CT ABDOMEN AND PELVIS WITHOUT CONTRAST  TECHNIQUE: Multidetector CT imaging of the abdomen and pelvis was performed following the standard protocol without IV contrast.  COMPARISON:  01/05/2013  FINDINGS:  Lower chest: Images are degraded by patient motion. Probable minimal dependent bibasilar atelectasis is identified, suboptimally visualized.  Hepatobiliary: Extensive streak artifact due to patient's arms and motion. Unenhanced liver is normal in appearance. Gallbladder is normal.  Pancreas: Partly fatty infiltrated, normal.  Spleen: Normal  Adrenals/Urinary Tract: Left adrenal adenoma reidentified, suboptimally visualized due to motion. Bilateral radiopaque renal calculi are reidentified. There is severe right hydroureteronephrosis to the level of the 1.3 cm mid right  ureteral calculus image 43. Ureters are otherwise decompressed without other radiopaque ureteral calculus identified. Trace perinephric stranding. Bladder is decompressed and unremarkable.  Stomach/Bowel: No bowel wall thickening or focal segmental dilatation is identified. A few colonic tics are incidentally noted. Normal appendix. Bowel is contained within otherwise fat containing ventral abdominal wall hernia without evidence for obstruction or entrapment at this time. Moderate stool impaction within the rectum.  Vascular/Lymphatic: Moderate atheromatous aortic calcification without aneurysm. No lymphadenopathy.  Reproductive: Not applicable  Other: Fat containing umbilical hernia is identified. No free fluid or air.  Musculoskeletal: L5-S1 fusion hardware noted. No evidence for hardware failure. Severe multilevel disc degenerative changes identified with leftward curvature centered at L2. Evidence of previous posterior decompression L1-L2.  IMPRESSION: 1.3 cm mid right ureteral calculus producing severe right hydroureteronephrosis proximally.  Bilateral radiopaque renal calculi.  Bowel containing ventral abdominal wall hernia without evidence for obstruction.   Electronically Signed   By: Christiana PellantGretchen  Green M.D.   On: 04/17/2014 16:09   Dg Chest Port 1 View  04/17/2014   CLINICAL DATA:  Altered mental status.  EXAM: PORTABLE CHEST - 1 VIEW   COMPARISON:  Single view of the chest 01/06/2013  FINDINGS: Lung volumes are low with mild basilar atelectasis. Small pleural effusion is noted. Heart size is upper normal with vascular congestion identified. No pneumothorax.  IMPRESSION: Pulmonary vascular congestion without frank edema. Very small left effusion is noted.   Electronically Signed   By: Drusilla Kannerhomas  Dalessio M.D.   On: 04/17/2014 13:37    ECG  Normal sinus rhythm with occasional and paired PVCs.  No ischemic changes.  ASSESSMENT AND PLAN 1.  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  He is only very mildly fluid overloaded at this point.  He is not in any respiratory distress.  He is not tachypnea.  Would limit IV fluids and once his urinary tract obstruction is released he will benefit from Lasix. 2.  Confusion probably secondary to fever and infection.  He also has baseline dementia. 3.  Acute on chronic kidney failure secondary to tract infection and to right ureteral stone  Recommendation: From a cardiac status he is stable enough to proceed with surgery.  Avoid overhydration in the perioperative period.  We will update his echocardiogram but this does not need to hold up the timing of surgery.    Signed, Cassell Clementhomas Briza Bark, MD  04/17/2014, 6:00 PM

## 2014-04-17 NOTE — Procedures (Signed)
IR Procedure Note: Procedure:  Right percutaneous nephrostomy Telephone consent obtained from wife for emergent right percutaneous nephrostomy to treat renal obstruction from calculi and sepsis Findings:  Overt pyonephrosis with pus in collecting system 10 Fr pigtail PCN placed and formed.  Connected to gravity bag. Sample of infected urine sent for culture.  Jodi MarbleGlenn T. Fredia SorrowYamagata, M.D. Pager:  [phone]

## 2014-04-17 NOTE — Progress Notes (Signed)
Patient refused to allow RN to insert foley catheter.  Dr. Laural BenesJohnson notified.  Catheter to be inserted in IR per Dr. Laural BenesJohnson.

## 2014-04-17 NOTE — ED Provider Notes (Signed)
CSN: [id number]     Arrival date & time 04/17/14  1239 History   First MD Initiated Contact with Patient 04/17/14 1243     Chief Complaint  Patient presents with  . Altered Mental Status  . Shortness of Breath     Patient is a 74 y.o. male presenting with altered mental status and shortness of breath. The history is provided by the EMS personnel. No language interpreter was used.  Altered Mental Status Shortness of Breath  Henry Wyatt presents for evaluation of AMS.  Level V caveat due to confusion.  Hx is provided by EMS personnel.  Per report he had decreased level of consciousness three days ago and then improved.  Today he had recurrent decreased LOC and they were concerned for CVA and referred him to the ED for further evaluation.  EMS report temperature of 101.1.    Past Medical History  Diagnosis Date  . Hypertension   . Diabetes mellitus without complication   . Constipation   . DVT (deep venous thrombosis)   . Back pain   . Kidney injury   . Septic shock     01/05/13- admitted to hospital   . Diarrhea     admitted to hospital on 01/05/13   . Pneumonia     health care associated- 01/05/13   . Kidney stone     01/05/13   . Hydronephrosis     left kidney 01/05/13   . Altered mental status     01/05/13   . Acute kidney injury     01/05/13   . C. difficile diarrhea     01/05/13- admitted to hospital   . Influenza A     01/05/13 admitted to hospital   . Acute pyelonephritis     01/05/13   . Hypokalemia   . Dysphagia   . 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 Dementia    Past Surgical History  Procedure Laterality Date  . Cystoscopy w/ ureteral stent placement Left 01/05/2013    Procedure: CYSTOSCOPY WITH RETROGRADE PYELOGRAM/URETERAL STENT PLACEMENT;  Surgeon: Valetta Fuller, MD;  Location: [facility];  Service: Urology;  Laterality: Left;  . Back surgery    . Cystoscopy with ureteroscopy Left 01/19/2013    Procedure:  CYSTOSCOPY WITH URETEROSCOPY;  Surgeon: Valetta Fuller, MD;  Location: [facility];  Service: Urology;  Laterality: Left;  REMOVAL OF JJ STENT      . Holmium laser application Left 01/19/2013    Procedure: HOLMIUM LASER APPLICATION;  Surgeon: Valetta Fuller, MD;  Location: [facility];  Service: Urology;  Laterality: Left;   History reviewed. No pertinent family history. History  Substance Use Topics  . Smoking status: Former Games developer  . Smokeless tobacco: Not on file  . Alcohol Use: No    Review of Systems  Respiratory: Positive for shortness of breath.   All other systems reviewed and are negative.     Allergies  Carbapenems; Cephalosporins; Erythromycin; Penicillins; and Sulfa antibiotics  Home Medications   Prior to Admission medications   Medication Sig Start Date End Date Taking? Authorizing Provider  acetaminophen (TYLENOL) 500 MG tablet Take 1,000 mg by mouth every 8 (eight) hours as needed for mild pain.    Historical Provider, MD  albuterol (PROVENTIL) (2.5 MG/3ML) 0.083% nebulizer solution Take 2.5 mg by nebulization every 2 (two) hours as needed for wheezing or shortness of breath.    Historical Provider, MD  apixaban (ELIQUIS) 5 MG TABS tablet Take 5 mg by mouth 2 (two) times daily. Stop date 01/17/13 per [facility], restart date 01/21/13 per [facility]    Historical Provider, MD  cephALEXin [facility]) 500 MG capsule  05/13/13   Historical Provider, MD  ciprofloxacin (CIPRO) 500 MG tablet  04/05/13   Historical Provider, MD  citalopram (CELEXA) 10 MG tablet Take 10 mg by mouth daily.    Historical Provider, MD  cyanocobalamin 1000 MCG tablet Take 1,000 mcg by mouth daily.     Historical Provider, MD  feeding supplement, ENSURE COMPLETE, (ENSURE COMPLETE) LIQD Take 237 mLs by mouth 2 (two) times daily between meals. 01/13/13   Penny Pia, MD  furosemide (LASIX) 40 MG tablet Take 40 mg by mouth daily. 01/13/13   Penny Pia, MD  insulin aspart (NOVOLOG) 100 UNIT/ML injection Inject 1-12  Units into the skin 3 (three) times daily before meals. Per sliding scale sliding scale per [facility] is as follows: <250= 0 units 251-300=4 units 301-350=6 units  351-400=8 units  401-450=10 units  451-500=12 units > 500=call MD    Historical Provider, MD  LEVEMIR 100 UNIT/ML injection  06/24/13   Historical Provider, MD  metoprolol succinate (TOPROL-XL) 25 MG 24 hr tablet Take 12.5 mg by mouth at bedtime. 01/13/13   Penny Pia, MD  metroNIDAZOLE (FLAGYL) 500 MG tablet Take 500 mg by mouth every 8 (eight) hours. 01/13/13   Penny Pia, MD  oxyCODONE (OXY IR/ROXICODONE) 5 MG immediate release tablet Take 5 mg by mouth every 6 (six) hours as needed for severe pain. 01/13/13   Penny Pia, MD  sulfamethoxazole-trimethoprim (BACTRIM DS) 800-160 MG per tablet Take 1 tablet by mouth every 12 (twelve) hours. 01/13/13   Penny Pia, MD  tobramycin-dexamethasone [facility]) ophthalmic solution  04/18/13   Historical Provider, MD  traMADol Janean Sark) 50 MG tablet  06/02/13   Historical Provider, MD   There were no vitals taken for this visit. Physical Exam  Constitutional: He appears well-developed and well-nourished.  HENT:  Head: Normocephalic and atraumatic.  Dry mucous membranes  Cardiovascular: Normal rate and regular rhythm.   No murmur heard. Pulmonary/Chest:  Tachypnea with rhonchi bilaterally  Abdominal: Soft. There is no tenderness. There is no rebound and no guarding.  Musculoskeletal: He exhibits no tenderness.  1+ pitting edema of BLE.  Neurological:  Drowsy but conversant.  Speech is difficult to understand at times.  Generalized weakness, confused.   Skin: Skin is warm and dry.  Psychiatric: He has a normal mood and affect. His behavior is normal.  Nursing note and vitals reviewed.   ED Course  Procedures (including critical care time) Labs Review Labs Reviewed  CBC WITH DIFFERENTIAL/PLATELET - Abnormal; Notable for the following:    WBC 11.4 (*)    RBC 4.13 (*)     Hemoglobin 12.2 (*)    HCT 37.8 (*)    Neutro Abs 8.2 (*)    Monocytes Absolute 1.2 (*)    All other components within normal limits  COMPREHENSIVE METABOLIC PANEL - Abnormal; Notable for the following:    Glucose, Bld 188 (*)    BUN 51 (*)    Creatinine, Ser 3.14 (*)    Albumin 2.8 (*)    GFR calc non Af Amer 18 (*)    GFR calc Af Amer 21 (*)    All other components within normal limits  URINALYSIS, ROUTINE W REFLEX MICROSCOPIC - Abnormal; Notable for the following:    APPearance TURBID (*)  Hgb urine dipstick MODERATE (*)    Protein, ur >300 (*)    Leukocytes, UA LARGE (*)    All other components within normal limits  URINE MICROSCOPIC-ADD ON - Abnormal; Notable for the following:    Bacteria, UA MANY (*)    All other components within normal limits  BRAIN NATRIURETIC PEPTIDE - Abnormal; Notable for the following:    B Natriuretic Peptide 359.5 (*)    All other components within normal limits  BASIC METABOLIC PANEL - Abnormal; Notable for the following:    Glucose, Bld 131 (*)    BUN 46 (*)    Creatinine, Ser 3.10 (*)    GFR calc non Af Amer 18 (*)    GFR calc Af Amer 21 (*)    All other components within normal limits  CBC - Abnormal; Notable for the following:    WBC 17.5 (*)    RBC 3.73 (*)    Hemoglobin 10.9 (*)    HCT 34.0 (*)    All other components within normal limits  GLUCOSE, CAPILLARY - Abnormal; Notable for the following:    Glucose-Capillary 152 (*)    All other components within normal limits  PROTIME-INR - Abnormal; Notable for the following:    Prothrombin Time 16.1 (*)    All other components within normal limits  GLUCOSE, CAPILLARY - Abnormal; Notable for the following:    Glucose-Capillary 122 (*)    All other components within normal limits  GLUCOSE, CAPILLARY - Abnormal; Notable for the following:    Glucose-Capillary 149 (*)    All other components within normal limits  GLUCOSE, CAPILLARY - Abnormal; Notable for the following:     Glucose-Capillary 127 (*)    All other components within normal limits  MRSA PCR SCREENING  CULTURE, BLOOD (ROUTINE X 2)  CULTURE, BLOOD (ROUTINE X 2)  URINE CULTURE  URINE CULTURE  TROPONIN I  APTT  HEMOGLOBIN A1C  I-STAT CG4 LACTIC ACID, ED  I-STAT CG4 LACTIC ACID, ED    Imaging Review Ct Abdomen Pelvis Wo Contrast  04/17/2014   CLINICAL DATA:  Code sepsis, acute renal injury, hydronephrosis  EXAM: CT ABDOMEN AND PELVIS WITHOUT CONTRAST  TECHNIQUE: Multidetector CT imaging of the abdomen and pelvis was performed following the standard protocol without IV contrast.  COMPARISON:  01/05/2013  FINDINGS: Lower chest: Images are degraded by patient motion. Probable minimal dependent bibasilar atelectasis is identified, suboptimally visualized.  Hepatobiliary: Extensive streak artifact due to patient's arms and motion. Unenhanced liver is normal in appearance. Gallbladder is normal.  Pancreas: Partly fatty infiltrated, normal.  Spleen: Normal  Adrenals/Urinary Tract: Left adrenal adenoma reidentified, suboptimally visualized due to motion. Bilateral radiopaque renal calculi are reidentified. There is severe right hydroureteronephrosis to the level of the 1.3 cm mid right ureteral calculus image 43. Ureters are otherwise decompressed without other radiopaque ureteral calculus identified. Trace perinephric stranding. Bladder is decompressed and unremarkable.  Stomach/Bowel: No bowel wall thickening or focal segmental dilatation is identified. A few colonic tics are incidentally noted. Normal appendix. Bowel is contained within otherwise fat containing ventral abdominal wall hernia without evidence for obstruction or entrapment at this time. Moderate stool impaction within the rectum.  Vascular/Lymphatic: Moderate atheromatous aortic calcification without aneurysm. No lymphadenopathy.  Reproductive: Not applicable  Other: Fat containing umbilical hernia is identified. No free fluid or air.  Musculoskeletal:  L5-S1 fusion hardware noted. No evidence for hardware failure. Severe multilevel disc degenerative changes identified with leftward curvature centered at L2. Evidence of previous  posterior decompression L1-L2.  IMPRESSION: 1.3 cm mid right ureteral calculus producing severe right hydroureteronephrosis proximally.  Bilateral radiopaque renal calculi.  Bowel containing ventral abdominal wall hernia without evidence for obstruction.   Electronically Signed   By: Christiana Pellant M.D.   On: 04/17/2014 16:09   Dg Chest Port 1 View  04/17/2014   CLINICAL DATA:  Altered mental status.  EXAM: PORTABLE CHEST - 1 VIEW  COMPARISON:  Single view of the chest 01/06/2013  FINDINGS: Lung volumes are low with mild basilar atelectasis. Small pleural effusion is noted. Heart size is upper normal with vascular congestion identified. No pneumothorax.  IMPRESSION: Pulmonary vascular congestion without frank edema. Very small left effusion is noted.   Electronically Signed   By: Drusilla Kanner M.D.   On: 04/17/2014 13:37     EKG Interpretation None      MDM   Final diagnoses:  Acute UTI (urinary tract infection)  Acute renal failure, unspecified acute renal failure type    Patient here for evaluation of altered mental status. Reviewed documentation from nursing home, he had a urinalysis that was cursed concerning for UTI performed on April 9. There is no evidence of antibiotics were administered.  Additional family history they stated that he's had decreased appetite and interaction the last several days. He did improve transiently over the weekend only to worsen again today. They describe no focal deficits. He had a history of similar presentation with infection in the past.  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 Given concern for potential pneumonia giving his lung  exam as well patient was started on a streaking M, ciprofloxacin, vancomycin. Discussed with medicine regarding admission for further management. BMP demonstrates acute renal failure. Discussed with his wife patient's advanced directives, he is to be DO NOT RESUSCITATE but that he wants treatment for infections.  Tilden Fossa, MD 04/18/14 [phone]

## 2014-04-17 NOTE — Progress Notes (Signed)
Took pt down to IR for procedure. Tried to explain situation to patient but pt still confused but calm. VSS. Reported off in IR.

## 2014-04-18 DIAGNOSIS — N189 Chronic kidney disease, unspecified: Secondary | ICD-10-CM

## 2014-04-18 DIAGNOSIS — I5032 Chronic diastolic (congestive) heart failure: Secondary | ICD-10-CM | POA: Diagnosis present

## 2014-04-18 DIAGNOSIS — F0391 Unspecified dementia with behavioral disturbance: Secondary | ICD-10-CM

## 2014-04-18 DIAGNOSIS — E119 Type 2 diabetes mellitus without complications: Secondary | ICD-10-CM | POA: Diagnosis present

## 2014-04-18 DIAGNOSIS — I509 Heart failure, unspecified: Secondary | ICD-10-CM

## 2014-04-18 DIAGNOSIS — G20 Parkinson's disease: Secondary | ICD-10-CM | POA: Diagnosis present

## 2014-04-18 DIAGNOSIS — N179 Acute kidney failure, unspecified: Secondary | ICD-10-CM | POA: Diagnosis present

## 2014-04-18 LAB — BASIC METABOLIC PANEL
ANION GAP: 10 (ref 5–15)
BUN: 46 mg/dL — AB (ref 6–23)
CO2: 22 mmol/L (ref 19–32)
CREATININE: 3.1 mg/dL — AB (ref 0.50–1.35)
Calcium: 8.4 mg/dL (ref 8.4–10.5)
Chloride: 106 mmol/L (ref 96–112)
GFR calc Af Amer: 21 mL/min — ABNORMAL LOW (ref >90)
GFR calc non Af Amer: 18 mL/min — ABNORMAL LOW (ref >90)
Glucose, Bld: 131 mg/dL — ABNORMAL HIGH (ref 70–99)
Potassium: 4.9 mmol/L (ref 3.5–5.1)
Sodium: 138 mmol/L (ref 135–145)

## 2014-04-18 LAB — GLUCOSE, CAPILLARY
GLUCOSE-CAPILLARY: 122 mg/dL — AB (ref 70–99)
GLUCOSE-CAPILLARY: 132 mg/dL — AB (ref 70–99)
GLUCOSE-CAPILLARY: 149 mg/dL — AB (ref 70–99)
Glucose-Capillary: 127 mg/dL — ABNORMAL HIGH (ref 70–99)
Glucose-Capillary: 128 mg/dL — ABNORMAL HIGH (ref 70–99)

## 2014-04-18 LAB — CBC
HCT: 34 % — ABNORMAL LOW (ref 39.0–52.0)
HEMOGLOBIN: 10.9 g/dL — AB (ref 13.0–17.0)
MCH: 29.2 pg (ref 26.0–34.0)
MCHC: 32.1 g/dL (ref 30.0–36.0)
MCV: 91.2 fL (ref 78.0–100.0)
Platelets: 198 10*3/uL (ref 150–400)
RBC: 3.73 MIL/uL — AB (ref 4.22–5.81)
RDW: 13.3 % (ref 11.5–15.5)
WBC: 17.5 10*3/uL — ABNORMAL HIGH (ref 4.0–10.5)

## 2014-04-18 MED ORDER — SODIUM CHLORIDE 0.9 % IV SOLN
INTRAVENOUS | Status: DC
  Administered 2014-04-18 – 2014-04-21 (×4): via INTRAVENOUS

## 2014-04-18 NOTE — Evaluation (Signed)
Physical Therapy Evaluation/ Discharge Patient Details Name: Iniko Robles MRN: [id number] DOB: [birth year] Today's Date: 04/18/2014   History of Present Illness  74 year old gentleman with a history of systolic and diastolic heart failure, most recent ejection fraction of 35%, presenting with altered mental status and low-grade temperature, with a urinalysis with evidence of an infection with severe right hydronephrosis s/p perc nephrostomy. Hx of R shoulder replacement, back surgery, HTN, DM who has resided at [facility] for over a year  Clinical Impression  Pt disoriented, confused with limited command following. Wife via phone states pt normally cognitively intact but has been unable to stand for over a year and is total assist at SNF at baseline. Pt stating no when asked if he could perform ROM but would then perform lifting or moving extremities. Pt physical function at baseline with decline in cognitive function. No further needs at this time and discussed with wife with recommendation for return to SNF, wife unsure if he was receiving any therapy at [facility] home.     Follow Up Recommendations SNF    Equipment Recommendations  None recommended by PT    Recommendations for Other Services       Precautions / Restrictions Precautions Precautions: Fall Restrictions Weight Bearing Restrictions: No      Mobility  Bed Mobility               General bed mobility comments: scoot to [facility] with max assist and cues, pt assisting with pushing with bil LE, pt denied attempting to sit up or roll  Transfers                    Ambulation/Gait                Stairs            Wheelchair Mobility    Modified Rankin (Stroke Patients Only)       Balance                                             Pertinent Vitals/Pain Pain Assessment: No/denies pain    Home Living Family/patient expects to be discharged to:: Skilled nursing  facility                      Prior Function Level of Independence: Needs assistance   Gait / Transfers Assistance Needed: pt is total assist with lift bed to WC daily at facility, does not walk or stand and has not for over a year  ADL's / Homemaking Assistance Needed: staff performs ADLs total assist        Hand Dominance        Extremity/Trunk Assessment   Upper Extremity Assessment: Generalized weakness           Lower Extremity Assessment: RLE deficits/detail;LLE deficits/detail RLE Deficits / Details: 2/5 knee flexion grossly 90 degrees unable to assess further ROM or strength secondary to AMS LLE Deficits / Details: 2/5 knee flexion grossly 90 degrees unable to assess further ROM or strength secondary to AMS  Cervical / Trunk Assessment: Kyphotic  Communication   Communication: HOH  Cognition Arousal/Alertness: Awake/alert Behavior During Therapy: Agitated Overall Cognitive Status: Impaired/Different from baseline Area of Impairment: Orientation;Attention;Following commands;Safety/judgement Orientation Level: Disoriented to;Time;Situation;Place Current Attention Level: Focused Memory: Decreased short-term memory Following Commands: Follows one step commands inconsistently  General Comments: Pt agitated with hand mittens and required 2 people to don, wife via phone states pt normally cognitively intact and oriented. Today oriented to self would state no to all commands and then perform    General Comments      Exercises General Exercises - Upper Extremity Shoulder Flexion: AAROM;Both;Supine (x 2 reps) General Exercises - Lower Extremity Heel Slides: AAROM;Both (x 2 reps)      Assessment/Plan    PT Assessment All further PT needs can be met in the next venue of care  PT Diagnosis     PT Problem List Decreased strength;Decreased activity tolerance;Obesity;Decreased cognition;Decreased mobility  PT Treatment Interventions     PT Goals  (Current goals can be found in the Care Plan section) Acute Rehab PT Goals PT Goal Formulation: All assessment and education complete, DC therapy    Frequency     Barriers to discharge        Co-evaluation               End of Session   Activity Tolerance: Treatment limited secondary to agitation Patient left: in bed;with bed alarm set;with nursing/sitter in room Nurse Communication: Mobility status;Need for lift equipment         Time: 0811-0825 PT Time Calculation (min) (ACUTE ONLY): 14 min   Charges:   PT Evaluation $Initial PT Evaluation Tier I: 1 Procedure     PT G CodesMelford Aase 04/18/2014, 8:37 AM Elwyn Reach, [place]

## 2014-04-18 NOTE — Progress Notes (Signed)
Utilization Review Completed.  

## 2014-04-18 NOTE — Progress Notes (Signed)
OT Cancellation Note  Patient Details Name: Gwyneth SproutWilliam Nofziger MRN: [id number] DOB: 1940-01-17   Cancelled Treatment:    Reason Eval/Treat Not Completed: OT screened, no needs identified, will sign off - Pt was total A for ADLs and mobility at SNF.   Angelene GiovanniConarpe, Dodge Ator M  Leshea Jaggers Benoitonarpe, OTR/L [phone]  04/18/2014, 2:41 PM
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
## 2014-04-18 NOTE — Progress Notes (Signed)
Subjective: Pt seen in SDU Afebrile but having some shaking chills this am. Some pain at new (R)PCN site.  Objective: Physical Exam: BP 152/73 mmHg  Pulse 94  Temp(Src) 97.9 F (36.6 C) (Oral)  Resp 18  Ht 6' (1.829 m)  Wt 242 lb 11.6 oz (110.1 kg)  BMI 32.91 kg/m2  SpO2 95% (R)PCN intact. Blood tinged, but thin UOP mixed with some debris.   Labs: CBC  Recent Labs  04/17/14 1315 04/18/14 0415  WBC 11.4* 17.5*  HGB 12.2* 10.9*  HCT 37.8* 34.0*  PLT 201 198   BMET  Recent Labs  04/17/14 1315 04/18/14 0415  NA 137 138  K 4.3 4.9  CL 104 106  CO2 22 22  GLUCOSE 188* 131*  BUN 51* 46*  CREATININE 3.14* 3.10*  CALCIUM 8.6 8.4   LFT  Recent Labs  04/17/14 1315  PROT 7.0  ALBUMIN 2.8*  AST 15  ALT 12  ALKPHOS 86  BILITOT 0.9   PT/INR  Recent Labs  04/17/14 1803  LABPROT 16.1*  INR 1.28     Studies/Results: Ct Abdomen Pelvis Wo Contrast  04/17/2014   CLINICAL DATA:  Code sepsis, acute renal injury, hydronephrosis  EXAM: CT ABDOMEN AND PELVIS WITHOUT CONTRAST  TECHNIQUE: Multidetector CT imaging of the abdomen and pelvis was performed following the standard protocol without IV contrast.  COMPARISON:  01/05/2013  FINDINGS: Lower chest: Images are degraded by patient motion. Probable minimal dependent bibasilar atelectasis is identified, suboptimally visualized.  Hepatobiliary: Extensive streak artifact due to patient's arms and motion. Unenhanced liver is normal in appearance. Gallbladder is normal.  Pancreas: Partly fatty infiltrated, normal.  Spleen: Normal  Adrenals/Urinary Tract: Left adrenal adenoma reidentified, suboptimally visualized due to motion. Bilateral radiopaque renal calculi are reidentified. There is severe right hydroureteronephrosis to the level of the 1.3 cm mid right ureteral calculus image 43. Ureters are otherwise decompressed without other radiopaque ureteral calculus identified. Trace perinephric stranding. Bladder is decompressed  and unremarkable.  Stomach/Bowel: No bowel wall thickening or focal segmental dilatation is identified. A few colonic tics are incidentally noted. Normal appendix. Bowel is contained within otherwise fat containing ventral abdominal wall hernia without evidence for obstruction or entrapment at this time. Moderate stool impaction within the rectum.  Vascular/Lymphatic: Moderate atheromatous aortic calcification without aneurysm. No lymphadenopathy.  Reproductive: Not applicable  Other: Fat containing umbilical hernia is identified. No free fluid or air.  Musculoskeletal: L5-S1 fusion hardware noted. No evidence for hardware failure. Severe multilevel disc degenerative changes identified with leftward curvature centered at L2. Evidence of previous posterior decompression L1-L2.  IMPRESSION: 1.3 cm mid right ureteral calculus producing severe right hydroureteronephrosis proximally.  Bilateral radiopaque renal calculi.  Bowel containing ventral abdominal wall hernia without evidence for obstruction.   Electronically Signed   By: Christiana PellantGretchen  Green M.D.   On: 04/17/2014 16:09   Dg Chest Port 1 View  04/17/2014   CLINICAL DATA:  Altered mental status.  EXAM: PORTABLE CHEST - 1 VIEW  COMPARISON:  Single view of the chest 01/06/2013  FINDINGS: Lung volumes are low with mild basilar atelectasis. Small pleural effusion is noted. Heart size is upper normal with vascular congestion identified. No pneumothorax.  IMPRESSION: Pulmonary vascular congestion without frank edema. Very small left effusion is noted.   Electronically Signed   By: Drusilla Kannerhomas  Dalessio M.D.   On: 04/17/2014 13:37   Ir Nephrostomy Placement Right  04/18/2014   CLINICAL DATA:  Urosepsis significant right hydronephrosis secondary to obstructing ureteral calculi. The  patient requires emergent placement of a right-sided percutaneous nephrostomy tube.  EXAM: 1. ULTRASOUND GUIDANCE FOR PUNCTURE OF THE RIGHT RENAL COLLECTING SYSTEM. 2. RIGHT PERCUTANEOUS  NEPHROSTOMY TUBE PLACEMENT.  COMPARISON:  CT of the abdomen and pelvis without contrast earlier today.  ANESTHESIA/SEDATION: 1.0 mg IV Versed; 50 mcg IV Fentanyl.  Total Moderate Sedation Time  20 minutes  CONTRAST:  10 ml Omnipaque 300  MEDICATIONS: The patient was still receiving a scheduled dose of 750 mg IV Levaquin at the time of initiation of the procedure. Additional prophylactic antibiotic was not therefore administered.  FLUOROSCOPY TIME:  54 seconds.  PROCEDURE: The procedure, risks, benefits, and alternatives were explained to the patient's wife. Questions regarding the procedure were encouraged and answered. The patient's wife understands and consents to the procedure. The patient is demented and was confused prior to the procedure and could not consent himself.  A time-out was performed prior to the procedure. The right flank region was prepped with Betadine in a sterile fashion, and a sterile drape was applied covering the operative field. A sterile gown and sterile gloves were used for the procedure. Local anesthesia was provided with 1% Lidocaine.  Ultrasound was used to localize the right kidney. Under direct ultrasound guidance, a 21 gauge needle was advanced into the renal collecting system. Ultrasound image documentation was performed. Aspiration of urine sample was performed followed by contrast injection. A urine sample was sent for culture analysis.  A transitional dilator was advanced over a guidewire. Percutaneous tract dilatation was then performed over the guidewire. A 10 -French percutaneous nephrostomy tube was then advanced and formed in the collecting system. Catheter position was confirmed by fluoroscopy after contrast injection.  The catheter was secured at the skin with a Prolene retention suture and Stat-Lock device. A gravity bag was placed.  COMPLICATIONS: None.  FINDINGS: Ultrasound confirms significant right-sided hydronephrosis. After lower pole access, aspiration yielded  grossly purulent material from the renal collecting system. A sample was sent for culture analysis. A 10 French nephrostomy tube was placed and formed at the level of the renal pelvis. The catheter is draining thick, purulent fluid after placement. Output will be followed.  IMPRESSION: Placement of right-sided percutaneous nephrostomy tube. There is evidence of gross pyonephrosis with purulent fluid present in the renal collecting system. A sample was sent for culture analysis. A 10 French tube was formed in the renal pelvis and connected to gravity bag drainage.   Electronically Signed   By: Irish Lack M.D.   On: 04/18/2014 09:16    Assessment/Plan: Overt pyelonephritis from obstructing stone Likely transient spike in WBC post drain placement. Good UOP from drain, Cr sl down----3.1 Cont to flush as ordered.    LOS: 1 day    Brayton El PA-C 04/18/2014 9:31 AM

## 2014-04-18 NOTE — Progress Notes (Signed)
  Echocardiogram 2D Echocardiogram has been performed.  Aris EvertsRix, Hawkins Seaman A 04/18/2014, 4:59 PM

## 2014-04-19 DIAGNOSIS — N201 Calculus of ureter: Secondary | ICD-10-CM | POA: Diagnosis present

## 2014-04-19 DIAGNOSIS — N1330 Unspecified hydronephrosis: Secondary | ICD-10-CM | POA: Diagnosis present

## 2014-04-19 LAB — GLUCOSE, CAPILLARY
GLUCOSE-CAPILLARY: 122 mg/dL — AB (ref 70–99)
Glucose-Capillary: 111 mg/dL — ABNORMAL HIGH (ref 70–99)
Glucose-Capillary: 102 mg/dL — ABNORMAL HIGH (ref 70–99)
Glucose-Capillary: 99 mg/dL (ref 70–99)
Glucose-Capillary: 135 mg/dL — ABNORMAL HIGH (ref 70–99)
Glucose-Capillary: 103 mg/dL — ABNORMAL HIGH (ref 70–99)

## 2014-04-19 LAB — CBC WITH DIFFERENTIAL/PLATELET
Basophils Absolute: 0 10*3/uL (ref 0.0–0.1)
Basophils Relative: 0 % (ref 0–1)
Eosinophils Absolute: 0.1 10*3/uL (ref 0.0–0.7)
Eosinophils Relative: 1 % (ref 0–5)
HEMATOCRIT: 33.9 % — AB (ref 39.0–52.0)
HEMOGLOBIN: 11.2 g/dL — AB (ref 13.0–17.0)
LYMPHS ABS: 0.7 10*3/uL (ref 0.7–4.0)
LYMPHS PCT: 6 % — AB (ref 12–46)
MCH: 29.9 pg (ref 26.0–34.0)
MCHC: 33 g/dL (ref 30.0–36.0)
MCV: 90.4 fL (ref 78.0–100.0)
Monocytes Absolute: 0.9 10*3/uL (ref 0.1–1.0)
Monocytes Relative: 7 % (ref 3–12)
Neutro Abs: 11.2 10*3/uL — ABNORMAL HIGH (ref 1.7–7.7)
Neutrophils Relative %: 86 % — ABNORMAL HIGH (ref 43–77)
PLATELETS: 179 10*3/uL (ref 150–400)
RBC: 3.75 MIL/uL — AB (ref 4.22–5.81)
RDW: 13.4 % (ref 11.5–15.5)
WBC: 12.8 10*3/uL — AB (ref 4.0–10.5)

## 2014-04-19 LAB — COMPREHENSIVE METABOLIC PANEL
ALBUMIN: 2.4 g/dL — AB (ref 3.5–5.2)
ALK PHOS: 78 U/L (ref 39–117)
ALT: 5 U/L (ref 0–53)
AST: 20 U/L (ref 0–37)
Anion gap: 14 (ref 5–15)
BUN: 43 mg/dL — ABNORMAL HIGH (ref 6–23)
CHLORIDE: 104 mmol/L (ref 96–112)
CO2: 18 mmol/L — ABNORMAL LOW (ref 19–32)
Calcium: 8.4 mg/dL (ref 8.4–10.5)
Creatinine, Ser: 2.66 mg/dL — ABNORMAL HIGH (ref 0.50–1.35)
GFR calc Af Amer: 26 mL/min — ABNORMAL LOW (ref >90)
GFR calc non Af Amer: 22 mL/min — ABNORMAL LOW (ref >90)
Glucose, Bld: 124 mg/dL — ABNORMAL HIGH (ref 70–99)
POTASSIUM: 3.5 mmol/L (ref 3.5–5.1)
SODIUM: 136 mmol/L (ref 135–145)
TOTAL PROTEIN: 6.3 g/dL (ref 6.0–8.3)
Total Bilirubin: 0.9 mg/dL (ref 0.3–1.2)

## 2014-04-19 LAB — URINE CULTURE

## 2014-04-19 LAB — HEMOGLOBIN A1C
HEMOGLOBIN A1C: 7.1 % — AB (ref 4.8–5.6)
Mean Plasma Glucose: 157 mg/dL

## 2014-04-19 LAB — MAGNESIUM: MAGNESIUM: 2 mg/dL (ref 1.5–2.5)

## 2014-04-19 MED ORDER — AZTREONAM 1 G IJ SOLR
1.0000 g | Freq: Three times a day (TID) | INTRAMUSCULAR | Status: DC
  Administered 2014-04-19 – 2014-04-23 (×12): 1 g via INTRAVENOUS
  Filled 2014-04-19 (×17): qty 1

## 2014-04-19 MED ORDER — ACETAMINOPHEN 325 MG PO TABS: 650.0000 mg | ORAL_TABLET | Freq: Four times a day (QID) | ORAL | Status: DC | PRN

## 2014-04-19 MED ORDER — INSULIN ASPART 100 UNIT/ML ~~LOC~~ SOLN
0.0000 [IU] | Freq: Three times a day (TID) | SUBCUTANEOUS | Status: DC
  Administered 2014-04-19 (×2): 1 [IU] via SUBCUTANEOUS
  Administered 2014-04-21 – 2014-04-22 (×3): 2 [IU] via SUBCUTANEOUS
  Administered 2014-04-23: 1 [IU] via SUBCUTANEOUS

## 2014-04-19 NOTE — Progress Notes (Signed)
ANTIBIOTIC CONSULT NOTE - INITIAL  Pharmacy Consult for aztreonam Indication: UTI  Allergies  Allergen Reactions  . Carbapenems Other (See Comments)    Per [facility] home [facility]  . Cephalosporins Other (See Comments)    Per nursing home [facility]  . Erythromycin Other (See Comments)    Per nursing home [facility]  . Penicillins Other (See Comments)    Per nursing home [facility]  . Sulfa Antibiotics Other (See Comments)    Per nursing home [facility]    Patient Measurements: Height: 6' (182.9 cm) Weight: 249 lb 9 oz (113.2 kg) IBW/kg (Calculated) : 77.6   Vital Signs: Temp: 99.1 F (37.3 C) (04/13 1123) Temp Source: Oral (04/13 1123) BP: 174/91 mmHg (04/13 1123) Pulse Rate: 69 (04/13 1123) Intake/Output from previous day: 04/12 0701 - 04/13 0700 In: 1223 [P.O.:240; I.V.:973] Out: 1632 [Urine:1630; Stool:2] Intake/Output from this shift: Total I/O In: 915.7 [P.O.:240; I.V.:670.7; Other:5] Out: 845 [Urine:845]  Labs:  Recent Labs  04/17/14 1315 04/18/14 0415 04/19/14 0241  WBC 11.4* 17.5* 12.8*  HGB 12.2* 10.9* 11.2*  PLT 201 198 179  CREATININE 3.14* 3.10* 2.66*   Estimated Creatinine Clearance: 31.6 mL/min (by C-G formula based on Cr of 2.66). No results for input(s): VANCOTROUGH, VANCOPEAK, VANCORANDOM, GENTTROUGH, GENTPEAK, GENTRANDOM, TOBRATROUGH, TOBRAPEAK, TOBRARND, AMIKACINPEAK, AMIKACINTROU, AMIKACIN in the last 72 hours.   Microbiology: Recent Results (from the past 720 hour(s))  Urine culture     Status: None   Collection Time: 04/17/14 12:59 PM  Result Value Ref Range Status   Specimen Description URINE, CATHETERIZED  Final   Special Requests NONE  Final   Colony Count   Final    50,000 COLONIES/ML Performed at [facility]    Culture   Final    PROTEUS MIRABILIS Performed at [facility]    Report Status 04/19/2014 FINAL  Final   Organism ID, Bacteria PROTEUS MIRABILIS  Final      Susceptibility   Proteus mirabilis - MIC*    AMPICILLIN 16  INTERMEDIATE Intermediate     CEFAZOLIN <=4 SENSITIVE Sensitive     CEFTRIAXONE <=1 SENSITIVE Sensitive     CIPROFLOXACIN >=4 RESISTANT Resistant     GENTAMICIN <=1 SENSITIVE Sensitive     LEVOFLOXACIN >=8 RESISTANT Resistant     NITROFURANTOIN RESISTANT      TOBRAMYCIN <=1 SENSITIVE Sensitive     TRIMETH/SULFA >=320 RESISTANT Resistant     PIP/TAZO <=4 SENSITIVE Sensitive     * PROTEUS MIRABILIS  Blood Culture (routine x 2)     Status: None (Preliminary result)   Collection Time: 04/17/14  1:15 PM  Result Value Ref Range Status   Specimen Description BLOOD LEFT ARM  Final   Special Requests BOTTLES DRAWN AEROBIC AND ANAEROBIC 5CC  Final   Culture   Final           BLOOD CULTURE RECEIVED NO GROWTH TO DATE CULTURE WILL BE HELD FOR 5 DAYS BEFORE ISSUING A FINAL NEGATIVE REPORT Performed at [facility]    Report Status PENDING  Incomplete  Blood Culture (routine x 2)     Status: None (Preliminary result)   Collection Time: 04/17/14  2:44 PM  Result Value Ref Range Status   Specimen Description BLOOD BLOOD RIGHT FOREARM  Final   Special Requests BOTTLES DRAWN AEROBIC AND ANAEROBIC  Final   Culture   Final           BLOOD CULTURE RECEIVED NO GROWTH TO DATE CULTURE WILL BE HELD  FOR 5 DAYS BEFORE ISSUING A FINAL NEGATIVE REPORT Performed at [facility]    Report Status PENDING  Incomplete  MRSA PCR Screening     Status: None   Collection Time: 04/17/14  4:45 PM  Result Value Ref Range Status   MRSA by PCR NEGATIVE NEGATIVE Final    Comment:        The GeneXpert MRSA Assay (FDA approved for NASAL specimens only), is one component of a comprehensive MRSA colonization surveillance program. It is not intended to diagnose MRSA infection nor to guide or monitor treatment for MRSA infections.   Urine culture     Status: None (Preliminary result)   Collection Time: 04/17/14  9:15 PM  Result Value Ref Range Status   Specimen Description URINE, RANDOM  Final    Special Requests URINE FROM RIGHT KIDNEY  Final   Colony Count   Final    >=100,000 COLONIES/ML Performed at [facility]    Culture   Final    PROTEUS MIRABILIS Performed at [facility]    Report Status PENDING  Incomplete    Medical History: Past Medical History  Diagnosis Date  . Hypertension   . Diabetes mellitus without complication   . Constipation   . DVT (deep venous thrombosis)   . Back pain   . Kidney injury   . Septic shock     01/05/13- admitted to hospital   . Diarrhea     admitted to hospital on 01/05/13   . Pneumonia     health care associated- 01/05/13   . Kidney stone     01/05/13   . Hydronephrosis     left kidney 01/05/13   . Altered mental status     01/05/13   . Acute kidney injury     01/05/13   . C. difficile diarrhea     01/05/13- admitted to hospital   . Influenza A     01/05/13 admitted to hospital   . Acute pyelonephritis     01/05/13   . Hypokalemia   . Dysphagia   . 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 Dementia     Medications:  Prescriptions prior to admission  Medication Sig Dispense Refill Last Dose  . carbidopa-levodopa (SINEMET IR) 10-100 MG per tablet Take 1 tablet by mouth 3 (three) times daily.   04/17/2014 at Unknown time  . memantine (NAMENDA XR) 28 MG CP24 24 hr capsule Take 28 mg by mouth daily.   04/17/2014 at Unknown time  . metoprolol succinate (TOPROL-XL) 25 MG 24 hr tablet Take 25 mg by mouth daily.    04/17/2014 at 0800  . senna (SENOKOT) 8.6 MG TABS tablet Take 1 tablet by mouth at bedtime.   04/16/2014 at Unknown time  . acetaminophen (TYLENOL) 500 MG tablet Take 1,000 mg by mouth every 8 (eight) hours as needed for mild pain.   04/12/2014  . bisacodyl (DULCOLAX) 5 MG EC tablet Take 5-10 mg by mouth daily as needed for moderate constipation.   over 30 days  . LEVEMIR 100 UNIT/ML injection Inject 4 Units into the skin daily.    04/15/2014  . mineral oil  liquid Place 0.1 mLs into both ears daily.   04/14/2014  . nitroGLYCERIN (NITROSTAT) 0.4 MG SL tablet Place 0.4 mg under the tongue every 5 (five) minutes as needed for chest pain.   over 30 days  . rOPINIRole (REQUIP) 0.5  MG tablet Take 0.5 mg by mouth 3 (three) times daily.     . traMADol (ULTRAM) 50 MG tablet Take 100 mg by mouth 2 (two) times daily as needed.   over 30 days   Assessment: 74 yo man with proteus in urine culture resistant to levaquin to change to aztreonam.  Goal of Therapy:  Eradication of infection  Plan:  Aztreonam 1 gm IV q8 hours F/u renal function, cultures and clinical course  Thanks for allowing pharmacy to be a part of this patient's care.  Talbert CageLora Pessy Delamar, PharmD Clinical Pharmacist, [phone] 04/19/2014,3:44 PM

## 2014-04-19 NOTE — Progress Notes (Signed)
PT Cancellation Note  Patient Details Name: Henry Wyatt MRN: [id number] DOB: [birth year]   Cancelled Treatment:    Reason Eval/Treat Not Completed: Other (comment);PT screened, no needs identified, will sign off; spoke with Dr. Sharon SellerMcClung who had not seen note from yesterday.  Noted pt without acute PT needs.  Will sign off.   Montrice Montuori,CYNDI 04/19/2014, 1:45 PM

## 2014-04-19 NOTE — Progress Notes (Addendum)
TEAM 1 - Stepdown/ICU TEAM Progress Note  Henry Wyatt YNW:[id number] DOB: 1940/05/21 DOA: 04/17/2014 PCP: Ginette Otto, MD  Admit HPI / Brief Narrative: 74 y.o. Male with a Hx of DM, mixed heart failure, dementia, and HTN who presented to the ER with altered mental status. The patient was reported to have a decreased level of consciousness 3 days prior, but this improved.  He developed AMS again 4/11 and was brought in by EMS. Chart review indicates that the patient had similar presentation in 2014 when he had a UTI and a ureter stone. He was stented and treated by Dr. Isabel Caprice at that time.    In the ER today the patient was markedly confused. His temperature was 101, creatinine 3.14, and wbc 11.4.   HPI/Subjective: Pt is very lethargic, but does awaken with stimulation.  He is not however able to provide a hx due to confusion.    Assessment/Plan:  Acute toxic metabolic encephalopathy Secondary to urinary tract infection with likely pyelonephritis and ?baseline dementia - follow   Sepsis due to Proteus Mirabilis Pyelonephritis complicated by R obstructing nephrolithiasis  multiple drug allergies - narrow abx to levaquin alone - f/u sensistivities - sepsis physiology has essentially resolved   Nephrolithiasis w/ L sided hydronephrosis  S/p percutaneous nephrostomy per IR 4/11 - Urology following - plan is for eventual endoscopic stone removal   Acute on chronic kidney failure  baseline crt 1.39 - crt improving s/p per neph  Chronic combined systolic and diastolic CHF  TTE January 2015 w/ EF of 35% with hypokinesis and grade 2 diastolic dysfunction - patient has had improvement in EF as well as diastolic function per TTE this admit (see below)  Diabetes Mellitus type 2 controlled Currently well controlled - cont to follow CBG - A1c 7.1  ?Dementia Uncertain baseline - follow w/ tx of infection   Code Status: FULL Family Communication: no family present at  time of exam Disposition Plan: stable for transfer to medical bed - will need sitter due to high risk of fall w/ confusion - begin PT/OT - follow mental status and renal fxn  Consultants: Dr.Glenn Fredia Sorrow (IR) Dr.Thomas Brackbill (cardiology) Dr.Matthew Mena Goes (urology)  Procedure/Significant Events: 4/11 CT abdomen pelvis without contrast;-1.3 cm mid right ureteral calculus producing severe right hydroureteronephrosis proximally.-Bilateral radiopaque renal calculi. -Bowel containing ventral abdominal wall hernia without evidence for obstruction. 4/11 Right percutaneous nephrostomy   4/12 TTE LVEF= 55% to 60% - grade 1 diastolic dysfunction - Left atrium mildly dilated  Antibiotics: Aztreonam 4/11  Levofloxacin 4/11 > Vancomycin 4/11 > 4/12  DVT prophylaxis: SQ heparin   Objective: Blood pressure 159/87, pulse 66, temperature 98.4 F (36.9 C), temperature source Oral, resp. rate 21, height 6' (1.829 m), weight 113.2 kg (249 lb 9 oz), SpO2 96 %.  Intake/Output Summary (Last 24 hours) at 04/19/14 1059 Last data filed at 04/19/14 0800  Gross per 24 hour  Intake   1223 ml  Output   1952 ml  Net   -729 ml   Exam: General:  No acute respiratory distress - quite lethargic  Lungs: Clear to auscultation bilaterally without wheezes or crackles Cardiovascular: Regular rate and rhythm without murmur gallop or rub normal S1 and S2 Abdomen: Nontender, nondistended, soft, bowel sounds positive, no rebound, no ascites, no appreciable mass - R neph tube draining dark brown/maroonish fluid  Extremities: No significant cyanosis, clubbing - 1+ B LE edema   Data Reviewed: Basic Metabolic Panel:  Recent Labs Lab 04/17/14 1315 04/18/14  0415 04/19/14 0241  NA 137 138 136  K 4.3 4.9 3.5  CL 104 106 104  CO2 22 22 18*  GLUCOSE 188* 131* 124*  BUN 51* 46* 43*  CREATININE 3.14* 3.10* 2.66*  CALCIUM 8.6 8.4 8.4  MG  --   --  2.0   Liver Function Tests:  Recent Labs Lab  04/17/14 1315 04/19/14 0241  AST 15 20  ALT 12 5  ALKPHOS 86 78  BILITOT 0.9 0.9  PROT 7.0 6.3  ALBUMIN 2.8* 2.4*   CBC:  Recent Labs Lab 04/17/14 1315 04/18/14 0415 04/19/14 0241  WBC 11.4* 17.5* 12.8*  NEUTROABS 8.2*  --  11.2*  HGB 12.2* 10.9* 11.2*  HCT 37.8* 34.0* 33.9*  MCV 91.5 91.2 90.4  PLT 201 198 179   Cardiac Enzymes:  Recent Labs Lab 04/17/14 1803  TROPONINI <0.03   BNP (last 3 results)  Recent Labs  04/17/14 1315  BNP 359.5*   CBG:  Recent Labs Lab 04/18/14 1127 04/18/14 2107 04/19/14 0209 04/19/14 0503 04/19/14 0745  GLUCAP 132* 128* 111* 102* 99    Recent Results (from the past 240 hour(s))  Urine culture     Status: None (Preliminary result)   Collection Time: 04/17/14 12:59 PM  Result Value Ref Range Status   Specimen Description URINE, CATHETERIZED  Final   Special Requests NONE  Final   Colony Count   Final    50,000 COLONIES/ML [facility]formed at [facility]    Culture   Final    PROTEUS MIRABILIS Performed at [facility]    Report Status PENDING  Incomplete  Blood Culture (routine x 2)     Status: None (Preliminary result)   Collection Time: 04/17/14  1:15 PM  Result Value Ref Range Status   Specimen Description BLOOD LEFT ARM  Final   Special Requests BOTTLES DRAWN AEROBIC AND ANAEROBIC 5CC  Final   Culture   Final           BLOOD CULTURE RECEIVED NO GROWTH TO DATE CULTURE WILL BE HELD FOR 5 DAYS BEFORE ISSUING A FINAL NEGATIVE REPORT Performed at [facility]    Report Status PENDING  Incomplete  Blood Culture (routine x 2)     Status: None (Preliminary result)   Collection Time: 04/17/14  2:44 PM  Result Value Ref Range Status   Specimen Description BLOOD BLOOD RIGHT FOREARM  Final   Special Requests BOTTLES DRAWN AEROBIC AND ANAEROBIC 10MLS  Final   Culture   Final           BLOOD CULTURE RECEIVED NO GROWTH TO DATE CULTURE WILL BE HELD FOR 5 DAYS BEFORE ISSUING A FINAL NEGATIVE  REPORT Performed at [facility]    Report Status PENDING  Incomplete  MRSA PCR Screening     Status: None   Collection Time: 04/17/14  4:45 PM  Result Value Ref Range Status   MRSA by PCR NEGATIVE NEGATIVE Final    Comment:        The GeneXpert MRSA Assay (FDA approved for NASAL specimens only), is one component of a comprehensive MRSA colonization surveillance program. It is not intended to diagnose MRSA infection nor to guide or monitor treatment for MRSA infections.   Urine culture     Status: None (Preliminary result)   Collection Time: 04/17/14  9:15 PM  Result Value Ref Range Status   Specimen Description URINE, RANDOM  Final   Special Requests URINE FROM RIGHT KIDNEY  Final  Colony Count   Final    >=100,000 COLONIES/ML Performed at [facility]    Culture   Final    PROTEUS MIRABILIS Performed at [facility]    Report Status PENDING  Incomplete     Studies:  Recent x-ray studies have been reviewed in detail by the Attending Physician  Scheduled Meds:  Scheduled Meds: . carbidopa-levodopa  1 tablet Oral TID  . heparin  5,000 Units Subcutaneous 3 times per day  . insulin aspart  0-9 Units Subcutaneous 6 times per day  . insulin detemir  4 Units Subcutaneous QHS  . levofloxacin (LEVAQUIN) IV  750 mg Intravenous Q48H  . memantine  28 mg Oral Daily  . metoprolol succinate  25 mg Oral Daily  . mineral oil  0.1 mL Otic Daily  . rOPINIRole  0.5 mg Oral TID  . senna  1 tablet Oral QHS  . sodium chloride  3 mL Intravenous Q12H  . sodium chloride  3 mL Intravenous Q12H  . vancomycin  1,000 mg Intravenous Q24H    Time spent on care of this patient: 35 mins  Lonia Blood, MD [facility] For Consults/Admissions - Flow Manager - [phone] Office  [phone]  Contact MD directly via text page:      [email or web address]      password [facility]  04/19/2014, 10:59 AM   LOS: 2 days

## 2014-04-19 NOTE — Progress Notes (Signed)
Patient ID: Henry Wyatt, male   DOB: 09-01-1940, 74 y.o.   MRN: [id number]      Subjective:  Pt doing ok; feeling a little better since rt kidney drained; tol diet ok; denies N/V or sig flank pain  Allergies: Carbapenems; Cephalosporins; Erythromycin; Penicillins; and Sulfa antibiotics  Medications: Prior to Admission medications   Medication Sig Start Date End Date Taking? Authorizing Provider  carbidopa-levodopa (SINEMET IR) 10-100 MG per tablet Take 1 tablet by mouth 3 (three) times daily.   Yes Historical Provider, MD  memantine (NAMENDA XR) 28 MG CP24 24 hr capsule Take 28 mg by mouth daily.   Yes Historical Provider, MD  metoprolol succinate (TOPROL-XL) 25 MG 24 hr tablet Take 25 mg by mouth daily.  01/13/13  Yes Penny Pia, MD  senna (SENOKOT) 8.6 MG TABS tablet Take 1 tablet by mouth at bedtime.   Yes Historical Provider, MD  acetaminophen (TYLENOL) 500 MG tablet Take 1,000 mg by mouth every 8 (eight) hours as needed for mild pain.    Historical Provider, MD  bisacodyl (DULCOLAX) 5 MG EC tablet Take 5-10 mg by mouth daily as needed for moderate constipation.    Historical Provider, MD  LEVEMIR 100 UNIT/ML injection Inject 4 Units into the skin daily.  06/24/13   Historical Provider, MD  mineral oil liquid Place 0.1 mLs into both ears daily.    Historical Provider, MD  nitroGLYCERIN (NITROSTAT) 0.4 MG SL tablet Place 0.4 mg under the tongue every 5 (five) minutes as needed for chest pain.    Historical Provider, MD  rOPINIRole (REQUIP) 0.5 MG tablet Take 0.5 mg by mouth 3 (three) times daily.    Historical Provider, MD  traMADol (ULTRAM) 50 MG tablet Take 100 mg by mouth 2 (two) times daily as needed.    Historical Provider, MD     Vital Signs: BP 179/83 mmHg  Pulse 69  Temp(Src) 98.5 F (36.9 C) (Oral)  Resp 19  Ht 6' (1.829 m)  Wt 249 lb 9 oz (113.2 kg)  BMI 33.84 kg/m2  SpO2 95%  Physical Exam rt PCN intact, output 525 cc's yellow urine; cx's- proteus; dressing  dry, not sig tender  Imaging: Ct Abdomen Pelvis Wo Contrast  04/17/2014   CLINICAL DATA:  Code sepsis, acute renal injury, hydronephrosis  EXAM: CT ABDOMEN AND PELVIS WITHOUT CONTRAST  TECHNIQUE: Multidetector CT imaging of the abdomen and pelvis was performed following the standard protocol without IV contrast.  COMPARISON:  01/05/2013  FINDINGS: Lower chest: Images are degraded by patient motion. Probable minimal dependent bibasilar atelectasis is identified, suboptimally visualized.  Hepatobiliary: Extensive streak artifact due to patient's arms and motion. Unenhanced liver is normal in appearance. Gallbladder is normal.  Pancreas: Partly fatty infiltrated, normal.  Spleen: Normal  Adrenals/Urinary Tract: Left adrenal adenoma reidentified, suboptimally visualized due to motion. Bilateral radiopaque renal calculi are reidentified. There is severe right hydroureteronephrosis to the level of the 1.3 cm mid right ureteral calculus image 43. Ureters are otherwise decompressed without other radiopaque ureteral calculus identified. Trace perinephric stranding. Bladder is decompressed and unremarkable.  Stomach/Bowel: No bowel wall thickening or focal segmental dilatation is identified. A few colonic tics are incidentally noted. Normal appendix. Bowel is contained within otherwise fat containing ventral abdominal wall hernia without evidence for obstruction or entrapment at this time. Moderate stool impaction within the rectum.  Vascular/Lymphatic: Moderate atheromatous aortic calcification without aneurysm. No lymphadenopathy.  Reproductive: Not applicable  Other: Fat containing umbilical hernia is identified. No free fluid or  air.  Musculoskeletal: L5-S1 fusion hardware noted. No evidence for hardware failure. Severe multilevel disc degenerative changes identified with leftward curvature centered at L2. Evidence of previous posterior decompression L1-L2.  IMPRESSION: 1.3 cm mid right ureteral calculus producing  severe right hydroureteronephrosis proximally.  Bilateral radiopaque renal calculi.  Bowel containing ventral abdominal wall hernia without evidence for obstruction.   Electronically Signed   By: Christiana Pellant M.D.   On: 04/17/2014 16:09   Dg Chest Port 1 View  04/17/2014   CLINICAL DATA:  Altered mental status.  EXAM: PORTABLE CHEST - 1 VIEW  COMPARISON:  Single view of the chest 01/06/2013  FINDINGS: Lung volumes are low with mild basilar atelectasis. Small pleural effusion is noted. Heart size is upper normal with vascular congestion identified. No pneumothorax.  IMPRESSION: Pulmonary vascular congestion without frank edema. Very small left effusion is noted.   Electronically Signed   By: Drusilla Kanner M.D.   On: 04/17/2014 13:37   Ir Nephrostomy Placement Right  04/18/2014   CLINICAL DATA:  Urosepsis significant right hydronephrosis secondary to obstructing ureteral calculi. The patient requires emergent placement of a right-sided percutaneous nephrostomy tube.  EXAM: 1. ULTRASOUND GUIDANCE FOR PUNCTURE OF THE RIGHT RENAL COLLECTING SYSTEM. 2. RIGHT PERCUTANEOUS NEPHROSTOMY TUBE PLACEMENT.  COMPARISON:  CT of the abdomen and pelvis without contrast earlier today.  ANESTHESIA/SEDATION: 1.0 mg IV Versed; 50 mcg IV Fentanyl.  Total Moderate Sedation Time  20 minutes  CONTRAST:  10 ml Omnipaque 300  MEDICATIONS: The patient was still receiving a scheduled dose of 750 mg IV Levaquin at the time of initiation of the procedure. Additional prophylactic antibiotic was not therefore administered.  FLUOROSCOPY TIME:  54 seconds.  PROCEDURE: The procedure, risks, benefits, and alternatives were explained to the patient's wife. Questions regarding the procedure were encouraged and answered. The patient's wife understands and consents to the procedure. The patient is demented and was confused prior to the procedure and could not consent himself.  A time-out was performed prior to the procedure. The right flank  region was prepped with Betadine in a sterile fashion, and a sterile drape was applied covering the operative field. A sterile gown and sterile gloves were used for the procedure. Local anesthesia was provided with 1% Lidocaine.  Ultrasound was used to localize the right kidney. Under direct ultrasound guidance, a 21 gauge needle was advanced into the renal collecting system. Ultrasound image documentation was performed. Aspiration of urine sample was performed followed by contrast injection. A urine sample was sent for culture analysis.  A transitional dilator was advanced over a guidewire. Percutaneous tract dilatation was then performed over the guidewire. A 10 -French percutaneous nephrostomy tube was then advanced and formed in the collecting system. Catheter position was confirmed by fluoroscopy after contrast injection.  The catheter was secured at the skin with a Prolene retention suture and Stat-Lock device. A gravity bag was placed.  COMPLICATIONS: None.  FINDINGS: Ultrasound confirms significant right-sided hydronephrosis. After lower pole access, aspiration yielded grossly purulent material from the renal collecting system. A sample was sent for culture analysis. A 10 French nephrostomy tube was placed and formed at the level of the renal pelvis. The catheter is draining thick, purulent fluid after placement. Output will be followed.  IMPRESSION: Placement of right-sided percutaneous nephrostomy tube. There is evidence of gross pyonephrosis with purulent fluid present in the renal collecting system. A sample was sent for culture analysis. A 10 French tube was formed in the renal pelvis and connected  to gravity bag drainage.   Electronically Signed   By: Irish LackGlenn  Yamagata M.D.   On: 04/18/2014 09:16    Labs:  CBC:  Recent Labs  04/17/14 1315 04/18/14 0415 04/19/14 0241  WBC 11.4* 17.5* 12.8*  HGB 12.2* 10.9* 11.2*  HCT 37.8* 34.0* 33.9*  PLT 201 198 179    COAGS:  Recent Labs   04/17/14 1803  INR 1.28  APTT 36    BMP:  Recent Labs  04/17/14 1315 04/18/14 0415 04/19/14 0241  NA 137 138 136  K 4.3 4.9 3.5  CL 104 106 104  CO2 22 22 18*  GLUCOSE 188* 131* 124*  BUN 51* 46* 43*  CALCIUM 8.6 8.4 8.4  CREATININE 3.14* 3.10* 2.66*  GFRNONAA 18* 18* 22*  GFRAA 21* 21* 26*    LIVER FUNCTION TESTS:  Recent Labs  04/17/14 1315 04/19/14 0241  BILITOT 0.9 0.9  AST 15 20  ALT 12 5  ALKPHOS 86 78  PROT 7.0 6.3  ALBUMIN 2.8* 2.4*    Assessment and Plan:  S/p right PCN 4/12 secondary to proteus urosepsis/obst stone; WBC down to 12.8(17.5), hgb stable at 11.2, creat 2.66(3.10); antbx per primary; PCN plans as per urology.  Signed: Chinita PesterALLRED,D KEVIN 04/19/2014, 5:23 PM   I spent a total of  in face to face in clinical consultation/evaluation, greater than 50% of which was counseling/coordinating care for right perc nephrostomy

## 2014-04-19 NOTE — Progress Notes (Signed)
eLink Physician-Brief Progress Note Patient Name: Henry SproutWilliam Lins DOB: [birth year] MRN: [id number]   Date of Service  04/19/2014  HPI/Events of Note  Patient is growing Proteus mirabilis in his urine (50K CFU/mL) which is resistant to Levaquin. Patient is on Levaquin.   eICU Interventions  Spoke with Dr. Sharon SellerMcClung ([facility]) who will address changing Abx Rx as patient has multiple Abx allergies.      Intervention Category Intermediate Interventions: Infection - evaluation and management  Nickey Canedo Eugene 04/19/2014, 3:08 PM

## 2014-04-19 NOTE — Progress Notes (Signed)
Subjective: Patient  Stable overnight with MAXIMUM TEMPERATURE 100.3.  Lethargic this morning but no complaints.  Denies any severe pain. Urine culture and urine from nephrostomy both growing Proteus.  Sensitivities remain pending. Urine from nephrostomy draining relatively clear.  Objective: Vital signs in last 24 hours: Temp:  [98 F (36.7 C)-100.3 F (37.9 C)] 98.4 F (36.9 C) (04/13 0744) Pulse Rate:  [59-83] 66 (04/13 0744) Resp:  [15-24] 21 (04/13 0744) BP: (134-230)/(60-181) 159/87 mmHg (04/13 0744) SpO2:  [93 %-97 %] 96 % (04/13 0744) Weight:  [113.2 kg (249 lb 9 oz)] 113.2 kg (249 lb 9 oz) (04/13 0431)  Intake/Output from previous day: 04/12 0701 - 04/13 0700 In: 1223 [P.O.:240; I.V.:973] Out: 1632 [Urine:1630; Stool:2] Intake/Output this shift: Total I/O In: -  Out: 320 [Urine:320]  Physical Exam:  Constitutional: Vital signs reviewed. WD WN in NAD   Eyes: PERRL, No scleral icterus.   Cardiovascular: RRR Pulmonary/Chest: Normal effort Abdominal: Soft. Non-tender, non-distended, bowel sounds are normal, no masses, organomegaly, or guarding present.  Genitourinary:indwelling Foley catheter and nephrostomy tube both draining well. Extremities: No cyanosis or edema   Lab Results:  Recent Labs  04/17/14 1315 04/18/14 0415 04/19/14 0241  HGB 12.2* 10.9* 11.2*  HCT 37.8* 34.0* 33.9*   BMET  Recent Labs  04/18/14 0415 04/19/14 0241  NA 138 136  K 4.9 3.5  CL 106 104  CO2 22 18*  GLUCOSE 131* 124*  BUN 46* 43*  CREATININE 3.10* 2.66*  CALCIUM 8.4 8.4    Recent Labs  04/17/14 1803  INR 1.28   No results for input(s): LABURIN in the last 72 hours. Results for orders placed or performed during the hospital encounter of 04/17/14  Urine culture     Status: None (Preliminary result)   Collection Time: 04/17/14 12:59 PM  Result Value Ref Range Status   Specimen Description URINE, CATHETERIZED  Final   Special Requests NONE  Final   Colony Count    Final    50,000 COLONIES/ML Performed at [facility]    Culture   Final    PROTEUS MIRABILIS Performed at [facility]    Report Status PENDING  Incomplete  Blood Culture (routine x 2)     Status: None (Preliminary result)   Collection Time: 04/17/14  1:15 PM  Result Value Ref Range Status   Specimen Description BLOOD LEFT ARM  Final   Special Requests BOTTLES DRAWN AEROBIC AND ANAEROBIC 5CC  Final   Culture   Final           BLOOD CULTURE RECEIVED NO GROWTH TO DATE CULTURE WILL BE HELD FOR 5 DAYS BEFORE ISSUING A FINAL NEGATIVE REPORT Performed at [facility]    Report Status PENDING  Incomplete  Blood Culture (routine x 2)     Status: None (Preliminary result)   Collection Time: 04/17/14  2:44 PM  Result Value Ref Range Status   Specimen Description BLOOD BLOOD RIGHT FOREARM  Final   Special Requests BOTTLES DRAWN AEROBIC AND ANAEROBIC  Final   Culture   Final           BLOOD CULTURE RECEIVED NO GROWTH TO DATE CULTURE WILL BE HELD FOR 5 DAYS BEFORE ISSUING A FINAL NEGATIVE REPORT Performed at [facility]    Report Status PENDING  Incomplete  MRSA PCR Screening     Status: None   Collection Time: 04/17/14  4:45 PM  Result Value Ref Range Status   MRSA by  PCR NEGATIVE NEGATIVE Final    Comment:        The GeneXpert MRSA Assay (FDA approved for NASAL specimens only), is one component of a comprehensive MRSA colonization surveillance program. It is not intended to diagnose MRSA infection nor to guide or monitor treatment for MRSA infections.   Urine culture     Status: None (Preliminary result)   Collection Time: 04/17/14  9:15 PM  Result Value Ref Range Status   Specimen Description URINE, RANDOM  Final   Special Requests URINE FROM RIGHT KIDNEY  Final   Colony Count   Final    >=100,000 COLONIES/ML Performed at [facility]    Culture   Final    PROTEUS MIRABILIS Performed at [facility]    Report  Status PENDING  Incomplete    Studies/Results: Ct Abdomen Pelvis Wo Contrast  04/17/2014   CLINICAL DATA:  Code sepsis, acute renal injury, hydronephrosis  EXAM: CT ABDOMEN AND PELVIS WITHOUT CONTRAST  TECHNIQUE: Multidetector CT imaging of the abdomen and pelvis was performed following the standard protocol without IV contrast.  COMPARISON:  01/05/2013  FINDINGS: Lower chest: Images are degraded by patient motion. Probable minimal dependent bibasilar atelectasis is identified, suboptimally visualized.  Hepatobiliary: Extensive streak artifact due to patient's arms and motion. Unenhanced liver is normal in appearance. Gallbladder is normal.  Pancreas: Partly fatty infiltrated, normal.  Spleen: Normal  Adrenals/Urinary Tract: Left adrenal adenoma reidentified, suboptimally visualized due to motion. Bilateral radiopaque renal calculi are reidentified. There is severe right hydroureteronephrosis to the level of the 1.3 cm mid right ureteral calculus image 43. Ureters are otherwise decompressed without other radiopaque ureteral calculus identified. Trace perinephric stranding. Bladder is decompressed and unremarkable.  Stomach/Bowel: No bowel wall thickening or focal segmental dilatation is identified. A few colonic tics are incidentally noted. Normal appendix. Bowel is contained within otherwise fat containing ventral abdominal wall hernia without evidence for obstruction or entrapment at this time. Moderate stool impaction within the rectum.  Vascular/Lymphatic: Moderate atheromatous aortic calcification without aneurysm. No lymphadenopathy.  Reproductive: Not applicable  Other: Fat containing umbilical hernia is identified. No free fluid or air.  Musculoskeletal: L5-S1 fusion hardware noted. No evidence for hardware failure. Severe multilevel disc degenerative changes identified with leftward curvature centered at L2. Evidence of previous posterior decompression L1-L2.  IMPRESSION: 1.3 cm mid right ureteral  calculus producing severe right hydroureteronephrosis proximally.  Bilateral radiopaque renal calculi.  Bowel containing ventral abdominal wall hernia without evidence for obstruction.   Electronically Signed   By: Christiana Pellant M.D.   On: 04/17/2014 16:09   Dg Chest Port 1 View  04/17/2014   CLINICAL DATA:  Altered mental status.  EXAM: PORTABLE CHEST - 1 VIEW  COMPARISON:  Single view of the chest 01/06/2013  FINDINGS: Lung volumes are low with mild basilar atelectasis. Small pleural effusion is noted. Heart size is upper normal with vascular congestion identified. No pneumothorax.  IMPRESSION: Pulmonary vascular congestion without frank edema. Very small left effusion is noted.   Electronically Signed   By: Drusilla Kanner M.D.   On: 04/17/2014 13:37   Ir Nephrostomy Placement Right  04/18/2014   CLINICAL DATA:  Urosepsis significant right hydronephrosis secondary to obstructing ureteral calculi. The patient requires emergent placement of a right-sided percutaneous nephrostomy tube.  EXAM: 1. ULTRASOUND GUIDANCE FOR PUNCTURE OF THE RIGHT RENAL COLLECTING SYSTEM. 2. RIGHT PERCUTANEOUS NEPHROSTOMY TUBE PLACEMENT.  COMPARISON:  CT of the abdomen and pelvis without contrast earlier today.  ANESTHESIA/SEDATION: 1.0  mg IV Versed; 50 mcg IV Fentanyl.  Total Moderate Sedation Time  20 minutes  CONTRAST:  10 ml Omnipaque 300  MEDICATIONS: The patient was still receiving a scheduled dose of 750 mg IV Levaquin at the time of initiation of the procedure. Additional prophylactic antibiotic was not therefore administered.  FLUOROSCOPY TIME:  54 seconds.  PROCEDURE: The procedure, risks, benefits, and alternatives were explained to the patient's wife. Questions regarding the procedure were encouraged and answered. The patient's wife understands and consents to the procedure. The patient is demented and was confused prior to the procedure and could not consent himself.  A time-out was performed prior to the procedure.  The right flank region was prepped with Betadine in a sterile fashion, and a sterile drape was applied covering the operative field. A sterile gown and sterile gloves were used for the procedure. Local anesthesia was provided with 1% Lidocaine.  Ultrasound was used to localize the right kidney. Under direct ultrasound guidance, a 21 gauge needle was advanced into the renal collecting system. Ultrasound image documentation was performed. Aspiration of urine sample was performed followed by contrast injection. A urine sample was sent for culture analysis.  A transitional dilator was advanced over a guidewire. Percutaneous tract dilatation was then performed over the guidewire. A 10 -French percutaneous nephrostomy tube was then advanced and formed in the collecting system. Catheter position was confirmed by fluoroscopy after contrast injection.  The catheter was secured at the skin with a Prolene retention suture and Stat-Lock device. A gravity bag was placed.  COMPLICATIONS: None.  FINDINGS: Ultrasound confirms significant right-sided hydronephrosis. After lower pole access, aspiration yielded grossly purulent material from the renal collecting system. A sample was sent for culture analysis. A 10 French nephrostomy tube was placed and formed at the level of the renal pelvis. The catheter is draining thick, purulent fluid after placement. Output will be followed.  IMPRESSION: Placement of right-sided percutaneous nephrostomy tube. There is evidence of gross pyonephrosis with purulent fluid present in the renal collecting system. A sample was sent for culture analysis. A 10 French tube was formed in the renal pelvis and connected to gravity bag drainage.   Electronically Signed   By: Irish LackGlenn  Yamagata M.D.   On: 04/18/2014 09:16    Assessment/Plan:   Proteus urosepsis.  Patient has substantial stone burden on the right side now decompressed nicely with percutaneous nephrostomy tube.  He will eventually require  endoscopic treatment potentially from above and below to completely render him stone free on that right side.  Would leave nephrostomy tube to gravity drainage for now.   LOS: 2 days   Annamaria Salah S 04/19/2014, 9:15 AM

## 2014-04-20 DIAGNOSIS — N1330 Unspecified hydronephrosis: Secondary | ICD-10-CM

## 2014-04-20 LAB — RENAL FUNCTION PANEL
Albumin: 2.2 g/dL — ABNORMAL LOW (ref 3.5–5.2)
Anion gap: 13 (ref 5–15)
BUN: 35 mg/dL — ABNORMAL HIGH (ref 6–23)
CALCIUM: 8.2 mg/dL — AB (ref 8.4–10.5)
CO2: 18 mmol/L — ABNORMAL LOW (ref 19–32)
Chloride: 107 mmol/L (ref 96–112)
Creatinine, Ser: 2.18 mg/dL — ABNORMAL HIGH (ref 0.50–1.35)
GFR calc non Af Amer: 28 mL/min — ABNORMAL LOW (ref >90)
GFR, EST AFRICAN AMERICAN: 33 mL/min — AB (ref >90)
Glucose, Bld: 95 mg/dL (ref 70–99)
PHOSPHORUS: 3.4 mg/dL (ref 2.3–4.6)
Potassium: 3.5 mmol/L (ref 3.5–5.1)
SODIUM: 138 mmol/L (ref 135–145)

## 2014-04-20 LAB — URINE CULTURE: Colony Count: >=100000

## 2014-04-20 LAB — CBC
HEMATOCRIT: 32.9 % — AB (ref 39.0–52.0)
Hemoglobin: 10.7 g/dL — ABNORMAL LOW (ref 13.0–17.0)
MCH: 29.6 pg (ref 26.0–34.0)
MCHC: 32.5 g/dL (ref 30.0–36.0)
MCV: 91.1 fL (ref 78.0–100.0)
Platelets: 188 10*3/uL (ref 150–400)
RBC: 3.61 MIL/uL — ABNORMAL LOW (ref 4.22–5.81)
RDW: 13.4 % (ref 11.5–15.5)
WBC: 9.4 10*3/uL (ref 4.0–10.5)

## 2014-04-20 LAB — GLUCOSE, CAPILLARY
GLUCOSE-CAPILLARY: 142 mg/dL — AB (ref 70–99)
Glucose-Capillary: 107 mg/dL — ABNORMAL HIGH (ref 70–99)
Glucose-Capillary: 109 mg/dL — ABNORMAL HIGH (ref 70–99)
Glucose-Capillary: 125 mg/dL — ABNORMAL HIGH (ref 70–99)

## 2014-04-20 MED ORDER — ENSURE ENLIVE PO LIQD
237.0000 mL | Freq: Two times a day (BID) | ORAL | Status: DC
  Administered 2014-04-20 – 2014-04-23 (×4): 237 mL via ORAL

## 2014-04-20 NOTE — Progress Notes (Signed)
OT Cancellation Note  Patient Details Name: Henry Wyatt MRN: [id number] DOB: 02-13-1940   Cancelled Treatment:    Reason Eval/Treat Not Completed: OT screened, no needs identified, will sign off. Received reconsult after signing off on patient on 04/18/14. Pt has no needs identified, will sign off - Pt was total A for ADLs and mobility at SNF.   Nena JordanMiller, Dijuan Sleeth M 04/20/2014, 7:47 AM

## 2014-04-20 NOTE — Care Management Note (Signed)
    Page 1 of 1   04/20/2014     11:49:58 AM CARE MANAGEMENT NOTE 04/20/2014  Patient:  Henry Wyatt,Henry Wyatt   Account Number:  [id number]  Date Initiated:  04/18/2014  Documentation initiated by:  MAYO,HENRIETTA  Subjective/Objective Assessment:   dx hepatic encephalopathy; resident [facility]    PCP  Hal Stoneking     Action/Plan:   Anticipated DC Date:  04/21/2014   Anticipated DC Plan:  SKILLED NURSING FACILITY  In-house referral  Clinical Social Worker      DC Planning Services  CM consult      Choice offered to / List presented to:             Status of service:  In process, will continue to follow Medicare Important Message given?  YES (If response is "NO", the following Medicare IM given date fields will be blank) Date Medicare IM given:  04/20/2014 Medicare IM given by:  Lawerance SabalSWIST,DEBBIE Date Additional Medicare IM given:   Additional Medicare IM given by:    Discharge Disposition:    Per UR Regulation:  Reviewed for med. necessity/level of care/duration of stay  If discussed at Long Length of Stay Meetings, dates discussed:    Comments:  04-20-14 given IM letter, will follow for any discharge needs. Lawerance Sabalebbie Swist RN BSN CM

## 2014-04-20 NOTE — Progress Notes (Signed)
Nephrostomy drain flushed at this time with 5cc.

## 2014-04-20 NOTE — Clinical Social Work Psychosocial (Signed)
Clinical Social Work Department BRIEF PSYCHOSOCIAL ASSESSMENT 04/20/2014  Patient:  Henry Wyatt,Richardson     Account Number:  [id number]     Admit date:  04/17/2014  Clinical Social Worker:  Stacy GardnerAVENPORT,Ermie Glendenning, CLINICAL SOCIAL WORKER  Date/Time:  04/20/2014 02:37 PM  Referred by:  Physician  Date Referred:  04/20/2014 Referred for  SNF Placement   Other Referral:   N/A   Interview type:  Family Other interview type:   N/A    PSYCHOSOCIAL DATA Living Status:  FACILITY [facility] Level of care:  Assisted Living Primary support name:  [name] Primary support relationship to patient:  SPOUSE Degree of support available:   Support is strong.    CURRENT CONCERNS Current Concerns  Post-Acute Placement   Other Concerns:   N/A    SOCIAL WORK ASSESSMENT / PLAN BSW intern spoke with patient's wife via bedside to discuss plans for discharge. [facility] has currently been [facility] for about one year. The patient's wife [facility]. Once stable, patient's wife would like [facility] so she can be close to him.   Assessment/plan status:  Psychosocial Support/Ongoing Assessment of Needs Other assessment/ plan:   N/A   Information/referral to community resources:   CSW contact information given to patient's family    PATIENT'S/FAMILY'S RESPONSE TO PLAN OF CARE: Patient's wife [facility]. On going assessment my be needed.      Stacy GardnerErin Ethell Blatchford, BSW Intern, [phone]

## 2014-04-20 NOTE — Progress Notes (Signed)
Patient has slight wheezing going on. RN will continue to monitor and report to on call MD/NP if there are any changes.

## 2014-04-20 NOTE — Progress Notes (Signed)
Progress Note  Gwyneth SproutWilliam Romig ZOX:[id number] DOB: Sep 05, 1940 DOA: 04/17/2014 PCP: Ginette OttoSTONEKING,HAL THOMAS, MD  Admit HPI / Brief Narrative: 74 y.o. Male with a Hx of DM, mixed heart failure, Parkinsons from SNF came to ED with urosepsis, encephalopathy, obstructing right ureteral stone and acute renal failure. Status post right percutaneous nephrostomy.  Has multiple antibiotic allergies.  Assessment/Plan:  Acute toxic metabolic encephalopathy Still somnolent and not eating much. Per wife, improved but far from baseline. She denies he has a history of dementia.  Sepsis due to Proteus Mirabilis Pyelonephritis complicated by R obstructing nephrolithiasis  Proteus is resistant to fluoroquinolones. Patient is allergic to penicillin, sulfa, cephalosporins, erythromycin. Antibiotic choices are pretty limited. Currently on aztreonam. Given complicated nature of his infection, would treat for 10-14 days. Will place PICC line. Creatinine improving at baseline creatinine about 1.3 or so. Discussed with wife and she agrees with PICC line.  Nephrolithiasis w/ L sided hydronephrosis  S/p percutaneous nephrostomy per IR 4/11 - Urology following - plan is for eventual endoscopic stone removal    Acute renal failure on chronic kidney disease stage III Secondary to obstruction and infection as well as prerenal azotemia. Need to watch for fluid overload given history of chronic mixed heart failure. Creatinine down to 2 today. Will slow IV fluids.  History of Chronic combined systolic and diastolic CHF  TTE January 2015 w/ EF of 35% with hypokinesis and grade 2 diastolic dysfunction - patient has had improvement in EF as well as diastolic function per TTE this admit. Currently fairly compensated.  Diabetes Mellitus type 2 controlled Currently well controlled - cont to follow CBG - A1c 7.1  Parkinson's disease Wife denies dementia. Patient lives permanently at skilled nursing facility at right stone.  Code  Status: FULL Family Communication: Wife at bedside Disposition Plan:   Consultants: Dr.Glenn Yamagata (IR) Dr.Thomas Brackbill (cardiology) Dr.Matthew Mena GoesEskridge (urology)  Procedure/Significant Events: 4/11 CT abdomen pelvis without contrast;-1.3 cm mid right ureteral calculus producing severe right hydroureteronephrosis proximally.-Bilateral radiopaque renal calculi. -Bowel containing ventral abdominal wall hernia without evidence for obstruction. 4/11 Right percutaneous nephrostomy   4/12 TTE LVEF= 55% to 60% - grade 1 diastolic dysfunction - Left atrium mildly dilated  Antibiotics: Aztreonam 4/11  Levofloxacin 4/11 > 4/13 Vancomycin 4/11 > 4/12  DVT prophylaxis: SQ heparin   HPI/Subjective: Patient denies complaints. Wife reports that patient has been sleepy but slightly more improved from admission. Has not been eating.  Objective: Blood pressure 155/93, pulse 64, temperature 99.1 F (37.3 C), temperature source Oral, resp. rate 24, height 6' (1.829 m), weight 111.1 kg (244 lb 14.9 oz), SpO2 96 %.  Intake/Output Summary (Last 24 hours) at 04/20/14 1229 Last data filed at 04/20/14 1143  Gross per 24 hour  Intake 1810.67 ml  Output   2226 ml  Net -415.33 ml   Exam: General:  Sleep. Arousable. Falls back asleep quickly. Answers questions. Lungs: Clear to auscultation bilaterally without wheezes or crackles Cardiovascular: Regular rate and rhythm without murmur gallop or rub normal S1 and S2 Abdomen: Nontender, nondistended, soft, bowel sounds positive, no rebound, no ascites, no appreciable mass - R neph tube draining tea-colored urine with sediment Extremities: No significant cyanosis, clubbing - 1+ B LE edema   Data Reviewed: Basic Metabolic Panel:  Recent Labs Lab 04/17/14 1315 04/18/14 0415 04/19/14 0241 04/20/14 0947  NA 137 138 136 138  K 4.3 4.9 3.5 3.5  CL 104 106 104 107  CO2 22 22 18* 18*  GLUCOSE 188* 131* 124* 95  BUN 51* 46* 43* 35*  CREATININE  3.14* 3.10* 2.66* 2.18*  CALCIUM 8.6 8.4 8.4 8.2*  MG  --   --  2.0  --   PHOS  --   --   --  3.4   Liver Function Tests:  Recent Labs Lab 04/17/14 1315 04/19/14 0241 04/20/14 0947  AST 15 20  --   ALT 12 5  --   ALKPHOS 86 78  --   BILITOT 0.9 0.9  --   PROT 7.0 6.3  --   ALBUMIN 2.8* 2.4* 2.2*   CBC:  Recent Labs Lab 04/17/14 1315 04/18/14 0415 04/19/14 0241 04/20/14 0947  WBC 11.4* 17.5* 12.8* 9.4  NEUTROABS 8.2*  --  11.2*  --   HGB 12.2* 10.9* 11.2* 10.7*  HCT 37.8* 34.0* 33.9* 32.9*  MCV 91.5 91.2 90.4 91.1  PLT 201 198 179 188   Cardiac Enzymes:  Recent Labs Lab 04/17/14 1803  TROPONINI <0.03   BNP (last 3 results)  Recent Labs  04/17/14 1315  BNP 359.5*   CBG:  Recent Labs Lab 04/19/14 1122 04/19/14 1547 04/19/14 2116 04/20/14 0746 04/20/14 1148  GLUCAP 122* 135* 103* 107* 109*    Recent Results (from the past 240 hour(s))  Urine culture     Status: None   Collection Time: 04/17/14 12:59 PM  Result Value Ref Range Status   Specimen Description URINE, CATHETERIZED  Final   Special Requests NONE  Final   Colony Count   Final    50,000 COLONIES/ML Performed at [facility]    Culture   Final    PROTEUS MIRABILIS Performed at [facility]    Report Status 04/19/2014 FINAL  Final   Organism ID, Bacteria PROTEUS MIRABILIS  Final      Susceptibility   Proteus mirabilis - MIC*    AMPICILLIN 16 INTERMEDIATE Intermediate     CEFAZOLIN <=4 SENSITIVE Sensitive     CEFTRIAXONE <=1 SENSITIVE Sensitive     CIPROFLOXACIN >=4 RESISTANT Resistant     GENTAMICIN <=1 SENSITIVE Sensitive     LEVOFLOXACIN >=8 RESISTANT Resistant     NITROFURANTOIN RESISTANT      TOBRAMYCIN <=1 SENSITIVE Sensitive     TRIMETH/SULFA >=320 RESISTANT Resistant     PIP/TAZO <=4 SENSITIVE Sensitive     * PROTEUS MIRABILIS  Blood Culture (routine x 2)     Status: None (Preliminary result)   Collection Time: 04/17/14  1:15 PM  Result Value Ref Range  Status   Specimen Description BLOOD LEFT ARM  Final   Special Requests BOTTLES DRAWN AEROBIC AND ANAEROBIC 5CC  Final   Culture   Final           BLOOD CULTURE RECEIVED NO GROWTH TO DATE CULTURE WILL BE HELD FOR 5 DAYS BEFORE ISSUING A FINAL NEGATIVE REPORT Performed at [facility]    Report Status PENDING  Incomplete  Blood Culture (routine x 2)     Status: None (Preliminary result)   Collection Time: 04/17/14  2:44 PM  Result Value Ref Range Status   Specimen Description BLOOD BLOOD RIGHT FOREARM  Final   Special Requests BOTTLES DRAWN AEROBIC AND ANAEROBIC  Final   Culture   Final           BLOOD CULTURE RECEIVED NO GROWTH TO DATE CULTURE WILL BE HELD FOR 5 DAYS BEFORE ISSUING A FINAL NEGATIVE REPORT Performed at [facility]    Report Status PENDING  Incomplete  MRSA  PCR Screening     Status: None   Collection Time: 04/17/14  4:45 PM  Result Value Ref Range Status   MRSA by PCR NEGATIVE NEGATIVE Final    Comment:        The GeneXpert MRSA Assay (FDA approved for NASAL specimens only), is one component of a comprehensive MRSA colonization surveillance program. It is not intended to diagnose MRSA infection nor to guide or monitor treatment for MRSA infections.   Urine culture     Status: None   Collection Time: 04/17/14  9:15 PM  Result Value Ref Range Status   Specimen Description URINE, RANDOM  Final   Special Requests URINE FROM RIGHT KIDNEY  Final   Colony Count   Final    >=100,000 COLONIES/ML Performed at [facility]    Culture   Final    PROTEUS MIRABILIS Performed at [facility]    Report Status 04/20/2014 FINAL  Final   Organism ID, Bacteria PROTEUS MIRABILIS  Final      Susceptibility   Proteus mirabilis - MIC*    AMPICILLIN >=32 RESISTANT Resistant     CEFAZOLIN <=4 SENSITIVE Sensitive     CEFTRIAXONE <=1 SENSITIVE Sensitive     CIPROFLOXACIN >=4 RESISTANT Resistant     GENTAMICIN <=1 SENSITIVE Sensitive      LEVOFLOXACIN >=8 RESISTANT Resistant     NITROFURANTOIN RESISTANT      TOBRAMYCIN <=1 SENSITIVE Sensitive     TRIMETH/SULFA >=320 RESISTANT Resistant     PIP/TAZO <=4 SENSITIVE Sensitive     * PROTEUS MIRABILIS     Studies:   Scheduled Meds:  Scheduled Meds: . aztreonam  1 g Intravenous Q8H  . carbidopa-levodopa  1 tablet Oral TID  . heparin  5,000 Units Subcutaneous 3 times per day  . insulin aspart  0-9 Units Subcutaneous TID WC  . insulin detemir  4 Units Subcutaneous QHS  . memantine  28 mg Oral Daily  . metoprolol succinate  25 mg Oral Daily  . mineral oil  0.1 mL Otic Daily  . rOPINIRole  0.5 mg Oral TID  . senna  1 tablet Oral QHS    Time spent on care of this patient: 25 mins  Crista Curb, MD [facility]       [email or web address]      password [facility]  04/20/2014, 12:29 PM   LOS: 3 days

## 2014-04-21 LAB — GLUCOSE, CAPILLARY
GLUCOSE-CAPILLARY: 86 mg/dL (ref 70–99)
Glucose-Capillary: 105 mg/dL — ABNORMAL HIGH (ref 70–99)
Glucose-Capillary: 187 mg/dL — ABNORMAL HIGH (ref 70–99)
Glucose-Capillary: 138 mg/dL — ABNORMAL HIGH (ref 70–99)

## 2014-04-21 LAB — BASIC METABOLIC PANEL
Anion gap: 10 (ref 5–15)
BUN: 26 mg/dL — AB (ref 6–23)
CO2: 22 mmol/L (ref 19–32)
CREATININE: 1.92 mg/dL — AB (ref 0.50–1.35)
Calcium: 8.2 mg/dL — ABNORMAL LOW (ref 8.4–10.5)
Chloride: 106 mmol/L (ref 96–112)
GFR calc non Af Amer: 33 mL/min — ABNORMAL LOW (ref >90)
GFR, EST AFRICAN AMERICAN: 38 mL/min — AB (ref >90)
Glucose, Bld: 97 mg/dL (ref 70–99)
Potassium: 3.2 mmol/L — ABNORMAL LOW (ref 3.5–5.1)
Sodium: 138 mmol/L (ref 135–145)

## 2014-04-21 MED ORDER — SODIUM CHLORIDE 0.9 % IJ SOLN
10.0000 mL | INTRAMUSCULAR | Status: DC | PRN
  Administered 2014-04-22: 20 mL
  Administered 2014-04-23 (×2): 10 mL
  Filled 2014-04-21 (×3): qty 40

## 2014-04-21 NOTE — Progress Notes (Signed)
ANTIBIOTIC CONSULT NOTE - FOLLOW UP  Pharmacy Consult for Aztreonam Indication: Urosepsis  Allergies  Allergen Reactions  . Carbapenems Other (See Comments)    Per nursing home [facility]  . Cephalosporins Other (See Comments)    Per nursing home [facility]  . Erythromycin Other (See Comments)    Per nursing home mar  . Penicillins Other (See Comments)    Per nursing home [facility]  . Sulfa Antibiotics Other (See Comments)    Per nursing home mar    Patient Measurements: Height: 6' (182.9 cm) Weight: 240 lb 9.8 oz (109.14 kg) IBW/kg (Calculated) : 77.6 Adjusted Body Weight:    Vital Signs: Temp: 98.6 F (37 C) (04/15 0523) Temp Source: Oral (04/15 0523) BP: 161/87 mmHg (04/15 0523) Pulse Rate: 64 (04/15 0523) Intake/Output from previous day: 04/14 0701 - 04/15 0700 In: 1193.3 [P.O.:560; I.V.:473.3; IV Piggyback:150] Out: 2600 [Urine:2600] Intake/Output from this shift: Total I/O In: 120 [P.O.:120] Out: -   Labs:  Recent Labs  04/19/14 0241 04/20/14 0947 04/21/14 0555  WBC 12.8* 9.4  --   HGB 11.2* 10.7*  --   PLT 179 188  --   CREATININE 2.66* 2.18* 1.92*   Estimated Creatinine Clearance: 43.1 mL/min (by C-G formula based on Cr of 1.92). No results for input(s): VANCOTROUGH, VANCOPEAK, VANCORANDOM, GENTTROUGH, GENTPEAK, GENTRANDOM, TOBRATROUGH, TOBRAPEAK, TOBRARND, AMIKACINPEAK, AMIKACINTROU, AMIKACIN in the last 72 hours.   Microbiology: Recent Results (from the past 720 hour(s))  Urine culture     Status: None   Collection Time: 04/17/14 12:59 PM  Result Value Ref Range Status   Specimen Description URINE, CATHETERIZED  Final   Special Requests NONE  Final   Colony Count   Final    50,000 COLONIES/ML Performed at [facility]    Culture   Final    PROTEUS MIRABILIS Performed at [facility]    Report Status 04/19/2014 FINAL  Final   Organism ID, Bacteria PROTEUS MIRABILIS  Final      Susceptibility   Proteus mirabilis - MIC*    AMPICILLIN 16  INTERMEDIATE Intermediate     CEFAZOLIN <=4 SENSITIVE Sensitive     CEFTRIAXONE <=1 SENSITIVE Sensitive     CIPROFLOXACIN >=4 RESISTANT Resistant     GENTAMICIN <=1 SENSITIVE Sensitive     LEVOFLOXACIN >=8 RESISTANT Resistant     NITROFURANTOIN RESISTANT      TOBRAMYCIN <=1 SENSITIVE Sensitive     TRIMETH/SULFA >=320 RESISTANT Resistant     PIP/TAZO <=4 SENSITIVE Sensitive     * PROTEUS MIRABILIS  Blood Culture (routine x 2)     Status: None (Preliminary result)   Collection Time: 04/17/14  1:15 PM  Result Value Ref Range Status   Specimen Description BLOOD LEFT ARM  Final   Special Requests BOTTLES DRAWN AEROBIC AND ANAEROBIC 5CC  Final   Culture   Final           BLOOD CULTURE RECEIVED NO GROWTH TO DATE CULTURE WILL BE HELD FOR 5 DAYS BEFORE ISSUING A FINAL NEGATIVE REPORT Performed at [facility]    Report Status PENDING  Incomplete  Blood Culture (routine x 2)     Status: None (Preliminary result)   Collection Time: 04/17/14  2:44 PM  Result Value Ref Range Status   Specimen Description BLOOD BLOOD RIGHT FOREARM  Final   Special Requests BOTTLES DRAWN AEROBIC AND ANAEROBIC  Final   Culture   Final           BLOOD  CULTURE RECEIVED NO GROWTH TO DATE CULTURE WILL BE HELD FOR 5 DAYS BEFORE ISSUING A FINAL NEGATIVE REPORT [facility]    Report Status PENDING  Incomplete  MRSA PCR Screening     Status: None   Collection Time: 04/17/14  4:45 PM  Result Value Ref Range Status   MRSA by PCR NEGATIVE NEGATIVE Final    Comment:        The GeneXpert MRSA Assay (FDA approved for NASAL specimens only), is one component of a comprehensive MRSA colonization surveillance program. It is not intended to diagnose MRSA infection nor to guide or monitor treatment for MRSA infections.   Urine culture     Status: None   Collection Time: 04/17/14  9:15 PM  Result Value Ref Range Status   Specimen Description URINE, RANDOM  Final   Special Requests  URINE FROM RIGHT KIDNEY  Final   Colony Count   Final    >=100,000 COLONIES/ML [facility]formed at [facility]    Culture   Final    PROTEUS MIRABILIS Performed at [facility]    Report Status 04/20/2014 FINAL  Final   Organism ID, Bacteria PROTEUS MIRABILIS  Final      Susceptibility   Proteus mirabilis - MIC*    AMPICILLIN >=32 RESISTANT Resistant     CEFAZOLIN <=4 SENSITIVE Sensitive     CEFTRIAXONE <=1 SENSITIVE Sensitive     CIPROFLOXACIN >=4 RESISTANT Resistant     GENTAMICIN <=1 SENSITIVE Sensitive     LEVOFLOXACIN >=8 RESISTANT Resistant     NITROFURANTOIN RESISTANT      TOBRAMYCIN <=1 SENSITIVE Sensitive     TRIMETH/SULFA >=320 RESISTANT Resistant     PIP/TAZO <=4 SENSITIVE Sensitive     * PROTEUS MIRABILIS    Anti-infectives    Start     Dose/Rate Route Frequency Ordered Stop   04/19/14 1600  aztreonam (AZACTAM) 1 g in dextrose 5 % 50 mL IVPB     1 g 100 mL/hr over 30 Minutes Intravenous Every 8 hours 04/19/14 1537     04/18/14 1600  vancomycin (VANCOCIN) IVPB 1000 mg/200 mL premix  Status:  Discontinued     1,000 mg 200 mL/hr over 60 Minutes Intravenous Every 24 hours 04/17/14 1428 04/19/14 1109   04/18/14 1500  ciprofloxacin (CIPRO) IVPB 400 mg  Status:  Discontinued     400 mg 200 mL/hr over 60 Minutes Intravenous Every 24 hours 04/17/14 1428 04/17/14 1428   04/18/14 1400  ciprofloxacin (CIPRO) IVPB 400 mg  Status:  Discontinued     400 mg 200 mL/hr over 60 Minutes Intravenous Every 24 hours 04/17/14 1428 04/17/14 1651   04/18/14 0000  aztreonam (AZACTAM) 1 g in dextrose 5 % 50 mL IVPB  Status:  Discontinued     1 g 100 mL/hr over 30 Minutes Intravenous Every 8 hours 04/17/14 1428 04/17/14 1651   04/17/14 1800  levofloxacin (LEVAQUIN) IVPB 750 mg  Status:  Discontinued     750 mg 100 mL/hr over 90 Minutes Intravenous Every 48 hours 04/17/14 1659 04/19/14 1512   04/17/14 1430  aztreonam (AZACTAM) 1 g in dextrose 5 % 50 mL IVPB     1 g 100 mL/hr  over 30 Minutes Intravenous  Once 04/17/14 1428 04/17/14 1900   04/17/14 1345  vancomycin (VANCOCIN) 1,500 mg in sodium chloride 0.9 % 500 mL IVPB     1,500 mg 250 mL/hr over 120 Minutes Intravenous  Once 04/17/14 1331 04/17/14 1736  04/17/14 1345  ciprofloxacin (CIPRO) IVPB 400 mg     400 mg 200 mL/hr over 60 Minutes Intravenous  Once 04/17/14 1331 04/17/14 1439   04/17/14 1345  aztreonam (AZACTAM) injection 2 g  Status:  Discontinued     2 g Intramuscular  Once 04/17/14 1331 04/17/14 1426      Assessment: 73 YOM presented with AMS and SOB.    PMH: HTN, DM, DVT, HF, Parkinson's.   Infectious Disease:  D4 abx for Sepsis due to Proteus Mirabilis Pyelonephritis complicated by R obstructing nephrolithiasis. Afebrile. WBC 17.5>12.8>9.4. Resume azactam. PICC placed  Vanc 4/11 >>4/13 Cipro 4/11 >>4/11 Azactam 4/11 >>4/11, 4/13 LQ 4/11>>4/13  UCx>> Proteus mirabilis - sens to ancef, ctr, gent, zosyn tob.  Resistant to amp, quinolones.  Multiple allergies.   BCx>>ngtd  Cardiovascular: hypertensive, HF. BP 161/87, pulse 64 on Toprol XL. ECHO>> EF 55-60% RV normal  Endocrinology: A1C 7.1.CBGs 86-142 on SsI and Levemir  Gastrointestinal / Nutrition: dysphagia - LFTs WNL  Neurology: hx back pain, Parkinson's dz.  Meds: Sinemit, Namenda XR, Requip  Nephrology: CKD3. AKI - SCr down to 1.92 (BL 1.4). K 3.2 low.  Hematology / Oncology: Hgb 10.7.  Best practice:  heparin SQ  Plan:  Aztreonam 1 gm IV q8 hours   Kinsleigh Ludolph S. Merilynn Finland, PharmD, BCPS Clinical Staff Pharmacist Pager [phone]  Misty Stanley Stillinger 04/21/2014,10:43 AM

## 2014-04-21 NOTE — Progress Notes (Signed)
Progress Note  Henry SproutWilliam Wyatt ZOX:[id number] DOB: [birth year] DOA: 04/17/2014 PCP: Ginette OttoSTONEKING,HAL THOMAS, MD  Admit HPI / Brief Narrative: 74 y.o. Male with a Hx of DM, mixed heart failure, Parkinsons from SNF came to ED with urosepsis, encephalopathy, obstructing right ureteral stone and acute renal failure. Status post right percutaneous nephrostomy.  Has multiple antibiotic allergies.  Assessment/Plan:  Acute toxic metabolic encephalopathy Much improved. Alert and appropriate today. Close to clinical baseline suspect. He tells me he is bed and wheelchair bound.  Sepsis due to Proteus Mirabilis Pyelonephritis complicated by R obstructing nephrolithiasis  Proteus is resistant to fluoroquinolones. Patient is allergic to penicillin, sulfa, cephalosporins, erythromycin. Antibiotic choices are pretty limited. Currently on aztreonam. Given complicated nature of his infection, would treat for 10-14 days.  Creatinine improving at baseline creatinine about 1.3 or so.  PICC line in place. Anticipate we will be able to go back to skilled nursing facility in a day or 2 if okay with consultants. Have paged urology for recommendations.   Nephrolithiasis w/ L sided hydronephrosis  S/p percutaneous nephrostomy per IR 4/11 -  plan is for eventual stone removal. See above.   Acute renal failure on chronic kidney disease stage III Secondary to obstruction and infection as well as prerenal azotemia. Need to watch for fluid overload given history of chronic mixed heart failure. creatinine down to 1.9 . History of Chronic combined systolic and diastolic CHF  TTE January 2015 w/ EF of 35% with hypokinesis and grade 2 diastolic dysfunction - patient has had improvement in EF as well as diastolic function per TTE this admit. Currently euvolemic.   Diabetes Mellitus type 2 controlled Currently well controlled - cont to follow CBG - A1c 7.1  Parkinson's disease Wife denies dementia. [facility]  Code Status: FULL Family Communication: Wife at bedside Disposition Plan:   Consultants: Dr.Glenn Yamagata (IR) Dr.Thomas Brackbill (cardiology) Dr.Matthew Mena GoesEskridge (urology)  Procedure/Significant Events: 4/11 CT abdomen pelvis without contrast;-1.3 cm mid right ureteral calculus producing severe right hydroureteronephrosis proximally.-Bilateral radiopaque renal calculi. -Bowel containing ventral abdominal wall hernia without evidence for obstruction. 4/11 Right percutaneous nephrostomy   4/12 TTE LVEF= 55% to 60% - grade 1 diastolic dysfunction - Left atrium mildly dilated PICC line right arm   Antibiotics: Aztreonam 4/11  Levofloxacin 4/11 > 4/13 Vancomycin 4/11 > 4/12  DVT prophylaxis: SQ heparin   HPI/Subjective: No complaints. Denies shortness of breath. Eating small amounts per wife.   Objective: Blood pressure 184/79, pulse 62, temperature 97.8 F (36.6 C), temperature source Oral, resp. rate 20, height 6' (1.829 m), weight 109.14 kg (240 lb 9.8 oz), SpO2 96 %.  Intake/Output Summary (Last 24 hours) at 04/21/14 1426 Last data filed at 04/21/14 1355  Gross per 24 hour  Intake 1410.33 ml  Output   2300 ml  Net -889.67 ml   Exam: General:  Alert! Appropriate.  Lungs: Clear to auscultation bilaterally without wheezes or crackles Cardiovascular: Regular rate and rhythm without murmur gallop or rub normal S1 and S2 Abdomen: Nontender, nondistended, soft, bowel sounds positive, no rebound, no ascites, no appreciable mass - R neph tube draining tea-colored urine without sediment Extremities: No significant cyanosis, clubbing - 1+ B LE edema   Data Reviewed: Basic Metabolic Panel:  Recent Labs Lab 04/17/14 1315 04/18/14 0415 04/19/14 0241 04/20/14 0947 04/21/14 0555  NA 137 138 136 138 138  K 4.3 4.9 3.5 3.5 3.2*  CL 104 106 104 107 106  CO2 22 22  18* 18* 22  GLUCOSE 188* 131* 124* 95 97  BUN 51* 46* 43* 35* 26*    CREATININE 3.14* 3.10* 2.66* 2.18* 1.92*  CALCIUM 8.6 8.4 8.4 8.2* 8.2*  MG  --   --  2.0  --   --   PHOS  --   --   --  3.4  --    Liver Function Tests:  Recent Labs Lab 04/17/14 1315 04/19/14 0241 04/20/14 0947  AST 15 20  --   ALT 12 5  --   ALKPHOS 86 78  --   BILITOT 0.9 0.9  --   PROT 7.0 6.3  --   ALBUMIN 2.8* 2.4* 2.2*   CBC:  Recent Labs Lab 04/17/14 1315 04/18/14 0415 04/19/14 0241 04/20/14 0947  WBC 11.4* 17.5* 12.8* 9.4  NEUTROABS 8.2*  --  11.2*  --   HGB 12.2* 10.9* 11.2* 10.7*  HCT 37.8* 34.0* 33.9* 32.9*  MCV 91.5 91.2 90.4 91.1  PLT 201 198 179 188   Cardiac Enzymes:  Recent Labs Lab 04/17/14 1803  TROPONINI <0.03   BNP (last 3 results)  Recent Labs  04/17/14 1315  BNP 359.5*   CBG:  Recent Labs Lab 04/20/14 1148 04/20/14 1632 04/20/14 2136 04/21/14 0801 04/21/14 1153  GLUCAP 109* 142* 125* 86 105*    Recent Results (from the past 240 hour(s))  Urine culture     Status: None   Collection Time: 04/17/14 12:59 PM  Result Value Ref Range Status   Specimen Description URINE, CATHETERIZED  Final   Special Requests NONE  Final   Colony Count   Final    50,000 COLONIES/ML Performed at [facility]    Culture   Final    PROTEUS MIRABILIS Performed at [facility]    Report Status 04/19/2014 FINAL  Final   Organism ID, Bacteria PROTEUS MIRABILIS  Final      Susceptibility   Proteus mirabilis - MIC*    AMPICILLIN 16 INTERMEDIATE Intermediate     CEFAZOLIN <=4 SENSITIVE Sensitive     CEFTRIAXONE <=1 SENSITIVE Sensitive     CIPROFLOXACIN >=4 RESISTANT Resistant     GENTAMICIN <=1 SENSITIVE Sensitive     LEVOFLOXACIN >=8 RESISTANT Resistant     NITROFURANTOIN RESISTANT      TOBRAMYCIN <=1 SENSITIVE Sensitive     TRIMETH/SULFA >=320 RESISTANT Resistant     PIP/TAZO <=4 SENSITIVE Sensitive     * PROTEUS MIRABILIS  Blood Culture (routine x 2)     Status: None (Preliminary result)   Collection Time: 04/17/14   1:15 PM  Result Value Ref Range Status   Specimen Description BLOOD LEFT ARM  Final   Special Requests BOTTLES DRAWN AEROBIC AND ANAEROBIC 5CC  Final   Culture   Final           BLOOD CULTURE RECEIVED NO GROWTH TO DATE CULTURE WILL BE HELD FOR 5 DAYS BEFORE ISSUING A FINAL NEGATIVE REPORT Performed at [facility]    Report Status PENDING  Incomplete  Blood Culture (routine x 2)     Status: None (Preliminary result)   Collection Time: 04/17/14  2:44 PM  Result Value Ref Range Status   Specimen Description BLOOD BLOOD RIGHT FOREARM  Final   Special Requests BOTTLES DRAWN AEROBIC AND ANAEROBIC  Final   Culture   Final           BLOOD CULTURE RECEIVED NO GROWTH TO DATE CULTURE WILL BE HELD FOR 5 DAYS  BEFORE ISSUING A FINAL NEGATIVE REPORT Performed at [facility]    Report Status PENDING  Incomplete  MRSA PCR Screening     Status: None   Collection Time: 04/17/14  4:45 PM  Result Value Ref Range Status   MRSA by PCR NEGATIVE NEGATIVE Final    Comment:        The GeneXpert MRSA Assay (FDA approved for NASAL specimens only), is one component of a comprehensive MRSA colonization surveillance program. It is not intended to diagnose MRSA infection nor to guide or monitor treatment for MRSA infections.   Urine culture     Status: None   Collection Time: 04/17/14  9:15 PM  Result Value Ref Range Status   Specimen Description URINE, RANDOM  Final   Special Requests URINE FROM RIGHT KIDNEY  Final   Colony Count   Final    >=100,000 COLONIES/ML Performed at [facility]    Culture   Final    PROTEUS MIRABILIS Performed at [facility]    Report Status 04/20/2014 FINAL  Final   Organism ID, Bacteria PROTEUS MIRABILIS  Final      Susceptibility   Proteus mirabilis - MIC*    AMPICILLIN >=32 RESISTANT Resistant     CEFAZOLIN <=4 SENSITIVE Sensitive     CEFTRIAXONE <=1 SENSITIVE Sensitive     CIPROFLOXACIN >=4 RESISTANT Resistant      GENTAMICIN <=1 SENSITIVE Sensitive     LEVOFLOXACIN >=8 RESISTANT Resistant     NITROFURANTOIN RESISTANT      TOBRAMYCIN <=1 SENSITIVE Sensitive     TRIMETH/SULFA >=320 RESISTANT Resistant     PIP/TAZO <=4 SENSITIVE Sensitive     * PROTEUS MIRABILIS     Studies:   Scheduled Meds:  Scheduled Meds: . aztreonam  1 g Intravenous Q8H  . carbidopa-levodopa  1 tablet Oral TID  . feeding supplement (ENSURE ENLIVE)  237 mL Oral BID BM  . heparin  5,000 Units Subcutaneous 3 times per day  . insulin aspart  0-9 Units Subcutaneous TID WC  . insulin detemir  4 Units Subcutaneous QHS  . memantine  28 mg Oral Daily  . metoprolol succinate  25 mg Oral Daily  . mineral oil  0.1 mL Otic Daily  . rOPINIRole  0.5 mg Oral TID  . senna  1 tablet Oral QHS    Time spent on care of this patient: 25 mins  Crista Curb, MD [facility]       [email or web address]      password [facility]  04/21/2014, 2:26 PM   LOS: 4 days

## 2014-04-21 NOTE — Clinical Social Work Note (Signed)
CSW spoke with the MD about discharge. [name] accept this pt over the weekend. CSW informed MD about weekend acceptance. CSW will continue to follow and assist with discharge.   Adali Pennings, MSW, LCSWA [phone]

## 2014-04-21 NOTE — Progress Notes (Signed)
Peripherally Inserted Central Catheter/Midline Placement  The IV Nurse has discussed with the patient and/or persons authorized to consent for the patient, the purpose of this procedure and the potential benefits and risks involved with this procedure.  The benefits include less needle sticks, lab draws from the catheter and patient may be discharged home with the catheter.  Risks include, but not limited to, infection, bleeding, blood clot (thrombus formation), and puncture of an artery; nerve damage and irregular heat beat.  Alternatives to this procedure were also discussed.  PICC/Midline Placement Documentation  PICC / Midline Single Lumen 04/21/14 PICC Right Brachial 43 cm 0 cm (Active)  Exposed Catheter (cm) 0 cm 04/21/2014 10:00 AM  Site Assessment Clean;Dry;Intact 04/21/2014 10:00 AM  Dressing Change Due 04/28/14 04/21/2014 10:00 AM       Katye Valek, Anderson MaltaJessica M 04/21/2014, 10:12 AM

## 2014-04-21 NOTE — Progress Notes (Signed)
Utilization Review completed. Arish Redner RN BSN CM 

## 2014-04-21 NOTE — Progress Notes (Signed)
  Mr. Grant RutsLavelle  has continued to improve.  Urine cultures were positive for Proteus both from the bladder and renal pelvis. His creatinine has continued to improve and is now 1.9.  Nephrostomy tube is draining well.  We discussed the plans with the hospitalist service.  The patient is likely be discharged either this weekend or early next week.  He will be discharged with a PICC line and intravenous antibiotics.  We will try to get him on the operative schedule to coincide with completion of his intravenous antibiotics.  He will need definitive management of this very large ureteral calculus.

## 2014-04-22 LAB — GLUCOSE, CAPILLARY
GLUCOSE-CAPILLARY: 195 mg/dL — AB (ref 70–99)
Glucose-Capillary: 97 mg/dL (ref 70–99)
Glucose-Capillary: 174 mg/dL — ABNORMAL HIGH (ref 70–99)

## 2014-04-22 LAB — BASIC METABOLIC PANEL
ANION GAP: 9 (ref 5–15)
BUN: 22 mg/dL (ref 6–23)
CO2: 21 mmol/L (ref 19–32)
Calcium: 8.3 mg/dL — ABNORMAL LOW (ref 8.4–10.5)
Chloride: 106 mmol/L (ref 96–112)
Creatinine, Ser: 1.72 mg/dL — ABNORMAL HIGH (ref 0.50–1.35)
GFR calc Af Amer: 43 mL/min — ABNORMAL LOW (ref >90)
GFR, EST NON AFRICAN AMERICAN: 37 mL/min — AB (ref >90)
Glucose, Bld: 128 mg/dL — ABNORMAL HIGH (ref 70–99)
POTASSIUM: 3.4 mmol/L — AB (ref 3.5–5.1)
Sodium: 136 mmol/L (ref 135–145)

## 2014-04-22 MED ORDER — POTASSIUM CHLORIDE CRYS ER 20 MEQ PO TBCR
40.0000 meq | EXTENDED_RELEASE_TABLET | Freq: Once | ORAL | Status: AC
  Administered 2014-04-22: 40 meq via ORAL
  Filled 2014-04-22: qty 2

## 2014-04-22 MED ORDER — METOPROLOL SUCCINATE ER 25 MG PO TB24
25.0000 mg | ORAL_TABLET | Freq: Once | ORAL | Status: AC
  Administered 2014-04-22: 25 mg via ORAL
  Filled 2014-04-22: qty 1

## 2014-04-22 MED ORDER — METOPROLOL SUCCINATE ER 50 MG PO TB24
50.0000 mg | ORAL_TABLET | Freq: Every day | ORAL | Status: DC
  Administered 2014-04-23: 50 mg via ORAL
  Filled 2014-04-22: qty 1

## 2014-04-22 NOTE — Progress Notes (Signed)
Progress Note  Gwyneth SproutWilliam Alwin ZOX:[id number] DOB: 1940-05-20 DOA: 04/17/2014 PCP: Ginette OttoSTONEKING,HAL THOMAS, MD  Admit HPI / Brief Narrative: 74 y.o. Male with a Hx of DM, mixed heart failure, Parkinsons from SNF came to ED with urosepsis, encephalopathy, obstructing right ureteral stone and acute renal failure. Status post right percutaneous nephrostomy.  Has multiple antibiotic allergies.  Assessment/Plan:  Acute toxic metabolic encephalopathy Alert and appropriate yesterday. Somnolent this morning however. Will monitor.  Sepsis due to Proteus Mirabilis Pyelonephritis complicated by R obstructing nephrolithiasis  Proteus is resistant to fluoroquinolones. Patient is allergic to penicillin, sulfa, cephalosporins, erythromycin. Antibiotic choices are pretty limited. Currently on aztreonam. Given complicated nature of his infection, would treat for 10-14 days.  Creatinine improving. at baseline creatinine about 1.3 or so.  1.7 today. PICC line in place. Discussed with Dr. Isabel CapriceGrapey 4/15.  After completion of antibiotic therapy, he would like PICC line to remain in place and then stone removal in case further antibiotics are needed. He will need follow-up with Dr. Wallace CullensGray be in about a week.  To skilled nursing facility once blood pressure better and more alert.  Nephrolithiasis w/ L sided hydronephrosis  S/p percutaneous nephrostomy per IR 4/11 -  plan is for eventual stone removal. See above.   Acute renal failure on chronic kidney disease stage III Improving toward baseline. Marland Kitchen. History of Chronic combined systolic and diastolic CHF  TTE January 2015 w/ EF of 35% with hypokinesis and grade 2 diastolic dysfunction - patient has had improvement in EF as well as diastolic function per TTE this admit. Currently euvolemic.   Diabetes Mellitus type 2 controlled Currently well controlled - cont to follow CBG - A1c 7.1  Parkinson's disease Wife denies dementia. [facility]  Essential hypertension: Blood pressure has been running high despite home Toprol dosing. Will increase to 50 mg daily and monitor.  Code Status: FULL Family Communication: Wife at bedside Disposition Plan: Back to skilled nursing facility when stable  Consultants: Dr.Glenn Fredia SorrowYamagata (IR) Dr.Thomas Patty SermonsBrackbill (cardiology) Dr.Matthew Mena GoesEskridge (urology)  Procedure/Significant Events: 4/11 CT abdomen pelvis without contrast;-1.3 cm mid right ureteral calculus producing severe right hydroureteronephrosis proximally.-Bilateral radiopaque renal calculi. -Bowel containing ventral abdominal wall hernia without evidence for obstruction. 4/11 Right percutaneous nephrostomy   4/12 TTE LVEF= 55% to 60% - grade 1 diastolic dysfunction - Left atrium mildly dilated PICC line right arm   Antibiotics: Aztreonam 4/11  Levofloxacin 4/11 > 4/13 Vancomycin 4/11 > 4/12  DVT prophylaxis: SQ heparin   HPI/Subjective: Sleepy today.  Objective: Blood pressure 140/66, pulse 72, temperature 98.6 F (37 C), temperature source Oral, resp. rate 20, height 6' (1.829 m), weight 108.8 kg (239 lb 13.8 oz), SpO2 95 %.  Intake/Output Summary (Last 24 hours) at 04/22/14 1155 Last data filed at 04/22/14 1109  Gross per 24 hour  Intake    784 ml  Output   1900 ml  Net  -1116 ml   Exam: General:  Asleep. Arousable but quickly falls back asleep. Lungs: Clear to auscultation bilaterally without wheezes or crackles Cardiovascular: Regular rate and rhythm without murmur gallop or rub normal S1 and S2 Abdomen: Nontender, nondistended, soft, bowel sounds positive, no rebound, no ascites, no appreciable mass - R neph tube draining lighter yellow urine without sediment GU: Foley catheter with minimal drainage. Extremities: No significant cyanosis, clubbing - 1+ B LE edema   Data Reviewed: Basic Metabolic Panel:  Recent Labs Lab 04/18/14 0415 04/19/14 0241 04/20/14 0947 04/21/14 0555  04/22/14 0910  NA 138 136 138 138 136  K 4.9 3.5 3.5 3.2* 3.4*  CL 106 104 107 106 106  CO2 22 18* 18* 22 21  GLUCOSE 131* 124* 95 97 128*  BUN 46* 43* 35* 26* 22  CREATININE 3.10* 2.66* 2.18* 1.92* 1.72*  CALCIUM 8.4 8.4 8.2* 8.2* 8.3*  MG  --  2.0  --   --   --   PHOS  --   --  3.4  --   --    Liver Function Tests:  Recent Labs Lab 04/17/14 1315 04/19/14 0241 04/20/14 0947  AST 15 20  --   ALT 12 5  --   ALKPHOS 86 78  --   BILITOT 0.9 0.9  --   PROT 7.0 6.3  --   ALBUMIN 2.8* 2.4* 2.2*   CBC:  Recent Labs Lab 04/17/14 1315 04/18/14 0415 04/19/14 0241 04/20/14 0947  WBC 11.4* 17.5* 12.8* 9.4  NEUTROABS 8.2*  --  11.2*  --   HGB 12.2* 10.9* 11.2* 10.7*  HCT 37.8* 34.0* 33.9* 32.9*  MCV 91.5 91.2 90.4 91.1  PLT 201 198 179 188   Cardiac Enzymes:  Recent Labs Lab 04/17/14 1803  TROPONINI <0.03   BNP (last 3 results)  Recent Labs  04/17/14 1315  BNP 359.5*   CBG:  Recent Labs Lab 04/21/14 0801 04/21/14 1153 04/21/14 1638 04/21/14 2146 04/22/14 0824  GLUCAP 86 105* 187* 138* 97    Recent Results (from the past 240 hour(s))  Urine culture     Status: None   Collection Time: 04/17/14 12:59 PM  Result Value Ref Range Status   Specimen Description URINE, CATHETERIZED  Final   Special Requests NONE  Final   Colony Count   Final    50,000 COLONIES/ML Performed at [facility]    Culture   Final    PROTEUS MIRABILIS Performed at [facility]    Report Status 04/19/2014 FINAL  Final   Organism ID, Bacteria PROTEUS MIRABILIS  Final      Susceptibility   Proteus mirabilis - MIC*    AMPICILLIN 16 INTERMEDIATE Intermediate     CEFAZOLIN <=4 SENSITIVE Sensitive     CEFTRIAXONE <=1 SENSITIVE Sensitive     CIPROFLOXACIN >=4 RESISTANT Resistant     GENTAMICIN <=1 SENSITIVE Sensitive     LEVOFLOXACIN >=8 RESISTANT Resistant     NITROFURANTOIN RESISTANT      TOBRAMYCIN <=1 SENSITIVE Sensitive     TRIMETH/SULFA >=320 RESISTANT  Resistant     PIP/TAZO <=4 SENSITIVE Sensitive     * PROTEUS MIRABILIS  Blood Culture (routine x 2)     Status: None (Preliminary result)   Collection Time: 04/17/14  1:15 PM  Result Value Ref Range Status   Specimen Description BLOOD LEFT ARM  Final   Special Requests BOTTLES DRAWN AEROBIC AND ANAEROBIC 5CC  Final   Culture   Final           BLOOD CULTURE RECEIVED NO GROWTH TO DATE CULTURE WILL BE HELD FOR 5 DAYS BEFORE ISSUING A FINAL NEGATIVE REPORT Performed at [facility]    Report Status PENDING  Incomplete  Blood Culture (routine x 2)     Status: None (Preliminary result)   Collection Time: 04/17/14  2:44 PM  Result Value Ref Range Status   Specimen Description BLOOD BLOOD RIGHT FOREARM  Final   Special Requests BOTTLES DRAWN AEROBIC AND ANAEROBIC  Final   Culture  Final           BLOOD CULTURE RECEIVED NO GROWTH TO DATE CULTURE WILL BE HELD FOR 5 DAYS BEFORE ISSUING A FINAL NEGATIVE REPORT Performed at [facility]END[facility]  Incomplete  MRSA PCR Screening     Status: None   Collection Time: 04/17/14  4:45 PM  Result Value Ref Range Status   MRSA by PCR NEGATIVE NEGATIVE Final    Comment:        The GeneXpert MRSA Assay (FDA approved for NASAL specimens only), is one component of a comprehensive MRSA colonization surveillance program. It is not intended to diagnose MRSA infection nor to guide or monitor treatment for MRSA infections.   Urine culture     Status: None   Collection Time: 04/17/14  9:15 PM  Result Value Ref Range Status   Specimen Description URINE, RANDOM  Final   Special Requests URINE FROM RIGHT KIDNEY  Final   Colony Count   Final    >=100,000 COLONIES/ML Performed at [facility]    Culture   Final    PROTEUS MIRABILIS Performed at [facility]    Report Status 04/20/2014 FINAL  Final   Organism ID, Bacteria PROTEUS MIRABILIS  Final      Susceptibility   Proteus mirabilis - MIC*     AMPICILLIN >=32 RESISTANT Resistant     CEFAZOLIN <=4 SENSITIVE Sensitive     CEFTRIAXONE <=1 SENSITIVE Sensitive     CIPROFLOXACIN >=4 RESISTANT Resistant     GENTAMICIN <=1 SENSITIVE Sensitive     LEVOFLOXACIN >=8 RESISTANT Resistant     NITROFURANTOIN RESISTANT      TOBRAMYCIN <=1 SENSITIVE Sensitive     TRIMETH/SULFA >=320 RESISTANT Resistant     PIP/TAZO <=4 SENSITIVE Sensitive     * PROTEUS MIRABILIS     Studies:   Scheduled Meds:  Scheduled Meds: . aztreonam  1 g Intravenous Q8H  . carbidopa-levodopa  1 tablet Oral TID  . feeding supplement (ENSURE ENLIVE)  237 mL Oral BID BM  . heparin  5,000 Units Subcutaneous 3 times per day  . insulin aspart  0-9 Units Subcutaneous TID WC  . insulin detemir  4 Units Subcutaneous QHS  . memantine  28 mg Oral Daily  . metoprolol succinate  25 mg Oral Once  . [START ON 04/23/2014] metoprolol succinate  50 mg Oral Daily  . mineral oil  0.1 mL Otic Daily  . rOPINIRole  0.5 mg Oral TID  . senna  1 tablet Oral QHS    Time spent on care of this patient: 15 mins  Crista Curb, MD [facility]       [email or web address]      password [facility]  04/22/2014, 11:55 AM   LOS: 5 days

## 2014-04-22 NOTE — Progress Notes (Signed)
Perc tube OK Afebrile Treat all positive c/s  F/up with Dr Isabel CapriceGrapey

## 2014-04-22 NOTE — Progress Notes (Addendum)
Patient not allowing staff to check CBG, patient told he has night time dose of Levemir, however, patient stated he does not want insulin or CBG check, patient irritated pushing at staff when CBG attempted, will continue to monitor CBG's as ordered.

## 2014-04-23 LAB — CULTURE, BLOOD (ROUTINE X 2)
Culture: NO GROWTH
Culture: NO GROWTH

## 2014-04-23 LAB — BASIC METABOLIC PANEL
Anion gap: 10 (ref 5–15)
BUN: 21 mg/dL (ref 6–23)
CO2: 24 mmol/L (ref 19–32)
CREATININE: 1.74 mg/dL — AB (ref 0.50–1.35)
Calcium: 8.5 mg/dL (ref 8.4–10.5)
Chloride: 105 mmol/L (ref 96–112)
GFR calc Af Amer: 43 mL/min — ABNORMAL LOW (ref >90)
GFR, EST NON AFRICAN AMERICAN: 37 mL/min — AB (ref >90)
Glucose, Bld: 132 mg/dL — ABNORMAL HIGH (ref 70–99)
Potassium: 3.8 mmol/L (ref 3.5–5.1)
Sodium: 139 mmol/L (ref 135–145)

## 2014-04-23 LAB — GLUCOSE, CAPILLARY
Glucose-Capillary: 118 mg/dL — ABNORMAL HIGH (ref 70–99)
Glucose-Capillary: 127 mg/dL — ABNORMAL HIGH (ref 70–99)

## 2014-04-23 MED ORDER — ENSURE ENLIVE PO LIQD: 237.0000 mL | Freq: Two times a day (BID) | ORAL | Status: AC

## 2014-04-23 MED ORDER — METOPROLOL SUCCINATE ER 50 MG PO TB24: 50.0000 mg | ORAL_TABLET | Freq: Every day | ORAL | Status: AC

## 2014-04-23 MED ORDER — HEPARIN SOD (PORK) LOCK FLUSH 100 UNIT/ML IV SOLN
250.0000 [IU] | INTRAVENOUS | Status: AC | PRN
  Administered 2014-04-23: 250 [IU]

## 2014-04-23 MED ORDER — DEXTROSE 5 % IV SOLN: 1.0000 g | Freq: Three times a day (TID) | INTRAVENOUS | Status: DC

## 2014-04-23 NOTE — Progress Notes (Signed)
Report called to Leticia Pennaonna Toy supervisor [facility].States she has his d/c summary in hand. Barbera Settersurner, Neythan Kozlov B RN

## 2014-04-23 NOTE — Discharge Summary (Signed)
Physician Discharge Summary  Henry Wyatt ZOX:[id number] DOB: 02-Aug-1940 DOA: 04/17/2014  PCP: Ginette Otto, MD  Admit date: 04/17/2014 Discharge date: 04/23/2014  Time spent: greater than 30 minutes  Recommendations for Outpatient Follow-up:  1. Continue aztreonam at least through 05/03/14, may need longer per Dr. Isabel Caprice procedure needed. 2. Would discuss with Dr. Isabel Caprice prior to discontinuing PICC line in case longer course of antibiotic needed. 3. Will need follow-up with Dr. Isabel Caprice for eventual removal of ureteral stone once infection treatment near completion. He reports his office will arrange follow-up. Please call the office if needed Monitor basic metabolic panel periodically Continue routine PICC line care. Continue Foley care and right nephrostomy tube care (flush with normal saline 5 mL 3 times a day).  And change dressing daily Back to skilled nursing facility Needs assistance with meals. Push fluids.  Discharge Diagnoses:  Principal Problem:   Acute encephalopathy, toxic metabolic Active Problems:   DM (diabetes mellitus), type 2 with renal complications   CKD (chronic kidney disease), stage III   Acute pyelonephritis   Chronic combined systolic and diastolic congestive heart failure, with improved ejection fraction during this hospitalization   Acute on chronic kidney failure secondary to obstruction, infection, and prerenal state.   Parkinson's disease   Right ureteral stone   Hydronephrosis, right Multiple reported antibiotic allergies  Discharge Condition: Stable  Diet recommendation: Heart healthy diabetic  Filed Weights   04/21/14 0500 04/22/14 0500 04/23/14 0507  Weight: 109.14 kg (240 lb 9.8 oz) 108.8 kg (239 lb 13.8 oz) 109 kg (240 lb 4.8 oz)    History of present illness:  74 year old gentleman with a history of systolic and diastolic heart failure, presenting with altered mental status and low-grade temperature, with a urinalysis with  evidence of an infection. Patient has renal failure with a creatinine of 3 from baseline of 1.3-.1.7.  A CT scan was obtained in the emergency room, which showed severe right-sided hydronephrosis due to a 1.3 cm right ureteral calculus. consulted urology, cardiology to evaluate patient as he has systolic heart failure for preop evaluation. Started on broad-spectrum antibiotics with vancomycin and levofloxacin as has multiple allergies, currently is normotensive and is not tachycardic. admitted to step down unit.  Hospital Course:   Sepsis due to Proteus Mirabilis Pyelonephritis complicated by R obstructing nephrolithiasis  Urology recommended percutaneous nephrostomy tube. IR placed on 411. Will need eventual stone removal once infection treated. Urine culture grew Proteus resistant to fluoroquinolones. Patient is allergic to penicillin, sulfa, cephalosporins, erythromycin. Therefore, antibiotics transitioned to aztreonam. Given complicated nature of his infection, would treat for 14 days. PICC line was placed and patient will return to skilled nursing facility to complete the course of therapy. Creatinine improving. at baseline creatinine about 1.3 or so. 1.7 today. Discussed with Dr. Isabel Caprice 4/15. After completion of antibiotic therapy, he would like PICC line to remain in place and then stone removal in case further antibiotics are needed. He will need follow-up in about a week.   Acute toxic metabolic encephalopathy Initially, very somnolent. Currently close to baseline. Able to communicate and cracked jokes. Tolerating a diet.  Tramadol stopped.  Acute renal failure on chronic kidney disease stage III Improving toward baseline. Marland Kitchen History of Chronic combined systolic and diastolic CHF  TTE January 2015 w/ EF of 35% with hypokinesis and grade 2 diastolic dysfunction - patient has had improvement in EF as well as diastolic function per TTE this admit. Currently euvolemic. Cardiology felt patient  euvolemic and signed off. Cleared  him for surgery.  Diabetes Mellitus type 2 controlled Currently well controlled - cont to follow CBG - A1c 7.1  Parkinson's disease Wife denies dementia. Patient lives permanently at skilled nursing facility at [facility].  Stable to go back today.  Essential hypertension: Blood pressure has been running high despite home Toprol dosing. Will increase to 50 mg daily  Code Status: FULL Family Communication: Wife at bedside Disposition Plan: Back to skilled nursing facility when stable  Consultants: Dr.Glenn Fredia Sorrow (IR) Dr.Thomas Patty Sermons (cardiology) Dr.Matthew Mena Goes (urology)  Procedure/Significant Events: 4/11 CT abdomen pelvis without contrast;-1.3 cm mid right ureteral calculus producing severe right hydroureteronephrosis proximally.-Bilateral radiopaque renal calculi. -Bowel containing ventral abdominal wall hernia without evidence for obstruction. 4/11 Right percutaneous nephrostomy  4/12 TTE LVEF= 55% to 60% - grade 1 diastolic dysfunction - Left atrium mildly dilated PICC line right arm   Antibiotics: Aztreonam 4/11 - Levofloxacin 4/11 > 4/13 Vancomycin 4/11 > 4/12  Discharge Exam: Filed Vitals:   04/23/14 0507  BP: 162/77  Pulse: 98  Temp: 99.1 F (37.3 C)  Resp: 18    General: Asleep. Arousable. Appropriate. Cardiovascular: Regular rate rhythm without murmurs gallops rubs Respiratory: Clear to auscultation bilaterally without wheeze rhonchi or rales Abdomen soft nontender nondistended GU Foley catheter with minimal amount of yellow urine Back right nephrostomy tube with yellow urine Extremities 1+ edema  Discharge Instructions   Discharge Instructions    Diet - low sodium heart healthy    Complete by:  As directed      Walk with assistance    Complete by:  As directed           Current Discharge Medication List    START taking these medications   Details  aztreonam 1 g in dextrose 5 % 50 mL Inject 1 g  into the vein every 8 (eight) hours. Through 05/03/14, then stop    feeding supplement, ENSURE ENLIVE, (ENSURE ENLIVE) LIQD Take 237 mLs by mouth 2 (two) times daily between meals. Qty: 237 mL, Refills: 12      CONTINUE these medications which have CHANGED   Details  metoprolol succinate (TOPROL-XL) 50 MG 24 hr tablet Take 1 tablet (50 mg total) by mouth daily.      CONTINUE these medications which have NOT CHANGED   Details  carbidopa-levodopa (SINEMET IR) 10-100 MG per tablet Take 1 tablet by mouth 3 (three) times daily.    memantine (NAMENDA XR) 28 MG CP24 24 hr capsule Take 28 mg by mouth daily.    senna (SENOKOT) 8.6 MG TABS tablet Take 1 tablet by mouth at bedtime.    acetaminophen (TYLENOL) 500 MG tablet Take 1,000 mg by mouth every 8 (eight) hours as needed for mild pain.    bisacodyl (DULCOLAX) 5 MG EC tablet Take 5-10 mg by mouth daily as needed for moderate constipation.    LEVEMIR 100 UNIT/ML injection Inject 4 Units into the skin daily.     mineral oil liquid Place 0.1 mLs into both ears daily.    nitroGLYCERIN (NITROSTAT) 0.4 MG SL tablet Place 0.4 mg under the tongue every 5 (five) minutes as needed for chest pain.    rOPINIRole (REQUIP) 0.5 MG tablet Take 0.5 mg by mouth 3 (three) times daily.      STOP taking these medications     traMADol (ULTRAM) 50 MG tablet        Allergies  Allergen Reactions  . Carbapenems Other (See Comments)    Per nursing home mar  .  Cephalosporins Other (See Comments)    Per nursing home mar  . Erythromycin Other (See Comments)    Per nursing home mar  . Penicillins Other (See Comments)    Per nursing home mar  . Sulfa Antibiotics Other (See Comments)    Per nursing home mar   Follow-up Information    Follow up with [facility] S, MD In 1 week.   Specialty:  Urology   Contact information:   [address] [place] [address] [phone]       Follow up with Ginette Otto, MD In 1 week.   Specialty:   Internal Medicine   Contact information:   [address] [place] [address] [phone]        The results of significant diagnostics from this hospitalization (including imaging, microbiology, ancillary and laboratory) are listed below for reference.    Significant Diagnostic Studies: Ct Abdomen Pelvis Wo Contrast  04/17/2014   CLINICAL DATA:  Code sepsis, acute renal injury, hydronephrosis  EXAM: CT ABDOMEN AND PELVIS WITHOUT CONTRAST  TECHNIQUE: Multidetector CT imaging of the abdomen and pelvis was performed following the standard protocol without IV contrast.  COMPARISON:  01/05/2013  FINDINGS: Lower chest: Images are degraded by patient motion. Probable minimal dependent bibasilar atelectasis is identified, suboptimally visualized.  Hepatobiliary: Extensive streak artifact due to patient's arms and motion. Unenhanced liver is normal in appearance. Gallbladder is normal.  Pancreas: Partly fatty infiltrated, normal.  Spleen: Normal  Adrenals/Urinary Tract: Left adrenal adenoma reidentified, suboptimally visualized due to motion. Bilateral radiopaque renal calculi are reidentified. There is severe right hydroureteronephrosis to the level of the 1.3 cm mid right ureteral calculus image 43. Ureters are otherwise decompressed without other radiopaque ureteral calculus identified. Trace perinephric stranding. Bladder is decompressed and unremarkable.  Stomach/Bowel: No bowel wall thickening or focal segmental dilatation is identified. A few colonic tics are incidentally noted. Normal appendix. Bowel is contained within otherwise fat containing ventral abdominal wall hernia without evidence for obstruction or entrapment at this time. Moderate stool impaction within the rectum.  Vascular/Lymphatic: Moderate atheromatous aortic calcification without aneurysm. No lymphadenopathy.  Reproductive: Not applicable  Other: Fat containing umbilical hernia is identified. No free fluid or air.   Musculoskeletal: L5-S1 fusion hardware noted. No evidence for hardware failure. Severe multilevel disc degenerative changes identified with leftward curvature centered at L2. Evidence of previous posterior decompression L1-L2.  IMPRESSION: 1.3 cm mid right ureteral calculus producing severe right hydroureteronephrosis proximally.  Bilateral radiopaque renal calculi.  Bowel containing ventral abdominal wall hernia without evidence for obstruction.   Electronically Signed   By: Christiana Pellant M.D.   On: 04/17/2014 16:09   Dg Chest Port 1 View  04/17/2014   CLINICAL DATA:  Altered mental status.  EXAM: PORTABLE CHEST - 1 VIEW  COMPARISON:  Single view of the chest 01/06/2013  FINDINGS: Lung volumes are low with mild basilar atelectasis. Small pleural effusion is noted. Heart size is upper normal with vascular congestion identified. No pneumothorax.  IMPRESSION: Pulmonary vascular congestion without frank edema. Very small left effusion is noted.   Electronically Signed   By: Drusilla Kanner M.D.   On: 04/17/2014 13:37   Ir Nephrostomy Placement Right  04/18/2014   CLINICAL DATA:  Urosepsis significant right hydronephrosis secondary to obstructing ureteral calculi. The patient requires emergent placement of a right-sided percutaneous nephrostomy tube.  EXAM: 1. ULTRASOUND GUIDANCE FOR PUNCTURE OF THE RIGHT RENAL COLLECTING SYSTEM. 2. RIGHT PERCUTANEOUS NEPHROSTOMY TUBE PLACEMENT.  COMPARISON:  CT of the abdomen and pelvis without contrast earlier today.  ANESTHESIA/SEDATION: 1.0 mg IV Versed; 50 mcg IV Fentanyl.  Total Moderate Sedation Time  20 minutes  CONTRAST:  10 ml Omnipaque 300  MEDICATIONS: The patient was still receiving a scheduled dose of 750 mg IV Levaquin at the time of initiation of the procedure. Additional prophylactic antibiotic was not therefore administered.  FLUOROSCOPY TIME:  54 seconds.  PROCEDURE: The procedure, risks, benefits, and alternatives were explained to the patient's wife.  Questions regarding the procedure were encouraged and answered. The patient's wife understands and consents to the procedure. The patient is demented and was confused prior to the procedure and could not consent himself.  A time-out was performed prior to the procedure. The right flank region was prepped with Betadine in a sterile fashion, and a sterile drape was applied covering the operative field. A sterile gown and sterile gloves were used for the procedure. Local anesthesia was provided with 1% Lidocaine.  Ultrasound was used to localize the right kidney. Under direct ultrasound guidance, a 21 gauge needle was advanced into the renal collecting system. Ultrasound image documentation was performed. Aspiration of urine sample was performed followed by contrast injection. A urine sample was sent for culture analysis.  A transitional dilator was advanced over a guidewire. Percutaneous tract dilatation was then performed over the guidewire. A 10 -French percutaneous nephrostomy tube was then advanced and formed in the collecting system. Catheter position was confirmed by fluoroscopy after contrast injection.  The catheter was secured at the skin with a Prolene retention suture and Stat-Lock device. A gravity bag was placed.  COMPLICATIONS: None.  FINDINGS: Ultrasound confirms significant right-sided hydronephrosis. After lower pole access, aspiration yielded grossly purulent material from the renal collecting system. A sample was sent for culture analysis. A 10 French nephrostomy tube was placed and formed at the level of the renal pelvis. The catheter is draining thick, purulent fluid after placement. Output will be followed.  IMPRESSION: Placement of right-sided percutaneous nephrostomy tube. There is evidence of gross pyonephrosis with purulent fluid present in the renal collecting system. A sample was sent for culture analysis. A 10 French tube was formed in the renal pelvis and connected to gravity bag  drainage.   Electronically Signed   By: Irish Lack M.D.   On: 04/18/2014 09:16    Microbiology: Recent Results (from the past 240 hour(s))  Urine culture     Status: None   Collection Time: 04/17/14 12:59 PM  Result Value Ref Range Status   Specimen Description URINE, CATHETERIZED  Final   Special Requests NONE  Final   Colony Count   Final    50,000 COLONIES/ML Performed at [facility]    Culture   Final    PROTEUS MIRABILIS Performed at [facility]    Report Status 04/19/2014 FINAL  Final   Organism ID, Bacteria PROTEUS MIRABILIS  Final      Susceptibility   Proteus mirabilis - MIC*    AMPICILLIN 16 INTERMEDIATE Intermediate     CEFAZOLIN <=4 SENSITIVE Sensitive     CEFTRIAXONE <=1 SENSITIVE Sensitive     CIPROFLOXACIN >=4 RESISTANT Resistant     GENTAMICIN <=1 SENSITIVE Sensitive     LEVOFLOXACIN >=8 RESISTANT Resistant     NITROFURANTOIN RESISTANT      TOBRAMYCIN <=1 SENSITIVE Sensitive     TRIMETH/SULFA >=320 RESISTANT Resistant     PIP/TAZO <=4 SENSITIVE Sensitive     * PROTEUS MIRABILIS  Blood  Culture (routine x 2)     Status: None   Collection Time: 04/17/14  1:15 PM  Result Value Ref Range Status   Specimen Description BLOOD LEFT ARM  Final   Special Requests BOTTLES DRAWN AEROBIC AND ANAEROBIC 5CC  Final   Culture   Final    NO GROWTH 5 DAYS [facility]formed at [facility]    Report Status 04/23/2014 FINAL  Final  Blood Culture (routine x 2)     Status: None   Collection Time: 04/17/14  2:44 PM  Result Value Ref Range Status   Specimen Description BLOOD BLOOD RIGHT FOREARM  Final   Special Requests BOTTLES DRAWN AEROBIC AND ANAEROBIC 10MLS  Final   Culture   Final    NO GROWTH 5 DAYS Performed at [facility]    Report Status 04/23/2014 FINAL  Final  MRSA PCR Screening     Status: None   Collection Time: 04/17/14  4:45 PM  Result Value Ref Range Status   MRSA by PCR NEGATIVE NEGATIVE Final    Comment:        The  GeneXpert MRSA Assay (FDA approved for NASAL specimens only), is one component of a comprehensive MRSA colonization surveillance program. It is not intended to diagnose MRSA infection nor to guide or monitor treatment for MRSA infections.   Urine culture     Status: None   Collection Time: 04/17/14  9:15 PM  Result Value Ref Range Status   Specimen Description URINE, RANDOM  Final   Special Requests URINE FROM RIGHT KIDNEY  Final   Colony Count   Final    >=100,000 COLONIES/ML Performed at [facility]    Culture   Final    PROTEUS MIRABILIS Performed at [facility]    Report Status 04/20/2014 FINAL  Final   Organism ID, Bacteria PROTEUS MIRABILIS  Final      Susceptibility   Proteus mirabilis - MIC*    AMPICILLIN >=32 RESISTANT Resistant     CEFAZOLIN <=4 SENSITIVE Sensitive     CEFTRIAXONE <=1 SENSITIVE Sensitive     CIPROFLOXACIN >=4 RESISTANT Resistant     GENTAMICIN <=1 SENSITIVE Sensitive     LEVOFLOXACIN >=8 RESISTANT Resistant     NITROFURANTOIN RESISTANT      TOBRAMYCIN <=1 SENSITIVE Sensitive     TRIMETH/SULFA >=320 RESISTANT Resistant     PIP/TAZO <=4 SENSITIVE Sensitive     * PROTEUS MIRABILIS     Labs: Basic Metabolic Panel:  Recent Labs Lab 04/19/14 0241 04/20/14 0947 04/21/14 0555 04/22/14 0910 04/23/14 0423  NA 136 138 138 136 139  K 3.5 3.5 3.2* 3.4* 3.8  CL 104 107 106 106 105  CO2 18* 18* 22 21 24   GLUCOSE 124* 95 97 128* 132*  BUN 43* 35* 26* 22 21  CREATININE 2.66* 2.18* 1.92* 1.72* 1.74*  CALCIUM 8.4 8.2* 8.2* 8.3* 8.5  MG 2.0  --   --   --   --   PHOS  --  3.4  --   --   --    Liver Function Tests:  Recent Labs Lab 04/17/14 1315 04/19/14 0241 04/20/14 0947  AST 15 20  --   ALT 12 5  --   ALKPHOS 86 78  --   BILITOT 0.9 0.9  --   PROT 7.0 6.3  --   ALBUMIN 2.8* 2.4* 2.2*   No results for input(s): LIPASE, AMYLASE in the last 168 hours. No results for input(s): AMMONIA  in the last 168  hours. CBC:  Recent Labs Lab 04/17/14 1315 04/18/14 0415 04/19/14 0241 04/20/14 0947  WBC 11.4* 17.5* 12.8* 9.4  NEUTROABS 8.2*  --  11.2*  --   HGB 12.2* 10.9* 11.2* 10.7*  HCT 37.8* 34.0* 33.9* 32.9*  MCV 91.5 91.2 90.4 91.1  PLT 201 198 179 188   Cardiac Enzymes:  Recent Labs Lab 04/17/14 1803  TROPONINI <0.03   BNP: BNP (last 3 results)  Recent Labs  04/17/14 1315  BNP 359.5*    ProBNP (last 3 results) No results for input(s): PROBNP in the last 8760 hours.  CBG:  Recent Labs Lab 04/21/14 2146 04/22/14 0824 04/22/14 1231 04/22/14 1709 04/23/14 0757  GLUCAP 138* 97 174* 195* 118*       Signed:  Adriauna Campton L  [facility] 04/23/2014, 12:12 PM

## 2014-05-01 ENCOUNTER — Encounter: Payer: Self-pay | Admitting: Podiatry

## 2014-05-01 ENCOUNTER — Ambulatory Visit (INDEPENDENT_AMBULATORY_CARE_PROVIDER_SITE_OTHER): Payer: Medicare Other | Admitting: Podiatry

## 2014-05-01 DIAGNOSIS — L609 Nail disorder, unspecified: Secondary | ICD-10-CM | POA: Diagnosis not present

## 2014-05-01 DIAGNOSIS — L608 Other nail disorders: Secondary | ICD-10-CM

## 2014-05-01 NOTE — Patient Instructions (Signed)
Return every 6 months for debridement of incurvated toenails

## 2014-05-02 NOTE — Progress Notes (Signed)
Patient ID: Henry SproutWilliam Wyatt, male   DOB: 05-16-1940, 74 y.o.   MRN: [id number]   Subjective: Patient presents with transporter from nursing home seated in a wheelchair and is requesting nail debridement  Objective: The toenails are normal trophic and mildly incurvated 6-10. There are no texture and color changes noted 6-10  Assessment: Incurvated non-dystrophic toenails 6-10 Minimal growth since last visit of 12/28 2015  Plan: Debrided non-dystrophic toenails 10 without any bleeding  Extend duration from visits to 6 months for toenail debridement

## 2014-05-03 ENCOUNTER — Other Ambulatory Visit: Payer: Self-pay | Admitting: Urology

## 2014-05-03 ENCOUNTER — Ambulatory Visit (HOSPITAL_COMMUNITY)
Admission: RE | Admit: 2014-05-03 | Discharge: 2014-05-03 | Disposition: A | Discharge status: Home / Self Care | Payer: Medicare Other | Source: Ambulatory Visit | Attending: Urology | Admitting: Urology

## 2014-05-03 ENCOUNTER — Other Ambulatory Visit (HOSPITAL_COMMUNITY): Payer: Self-pay | Admitting: Urology

## 2014-05-03 DIAGNOSIS — N201 Calculus of ureter: Secondary | ICD-10-CM

## 2014-05-04 ENCOUNTER — Other Ambulatory Visit (HOSPITAL_COMMUNITY): Payer: Self-pay | Admitting: Urology

## 2014-05-04 DIAGNOSIS — N200 Calculus of kidney: Secondary | ICD-10-CM

## 2014-05-12 ENCOUNTER — Encounter (HOSPITAL_COMMUNITY): Payer: Self-pay | Admitting: *Deleted

## 2014-05-12 NOTE — Progress Notes (Signed)
                 Your procedure is scheduled on: May 17, 2014  Report to [facility] at  0630 AM.   Call this number if you have problems the morning of surgery: [phone] Spoke with Gwendolyn Limaonya Payne, Rn supervisor [facility] [phone] fax number [phone] and reviewed the pre op instructions by telephone.   Will fax the instructions today, Friday, May 12, 2014 and call to confirm that the instructions were received and are clear.    Remember: Bring insurance card and picture ID, PLEASE SEND CURRENT  MEDICATION LIST AND TIME LAST MEDICATIONS TAKEN  WITH PATIENT MORNING OF SURGERY.    Do not drink liquids or  eat food:After Midnight. Tuesday night, May 16, 2014     Take these medicines the morning of surgery with A SIP OF WATER:Metoprolol, Sinemet, Namenda, May take Tramadol if needed for pain                                  SEE [facility] PREPARING FOR SURGERY SHEET    Do not wear jewelry, make-up or nail polish.  Do not wear lotions, powders, or perfumes. You may wear deodorant.             Men may shave face and neck.  Do not bring valuables to the hospital.  Contacts, dentures or bridgework may not be worn into surgery.  Leave suitcase in the car. After surgery it may be brought to your room.  For patients admitted to the hospital, checkout time is 11:00 AM the day of discharge.    Patients discharged the day of surgery will not be allowed to drive home.   Name and phone number of your driver: [facility] provides the transportation [phone] Mrs. Barbee CoughMargaret Snellings will not be able to come for the surgery.                      Call Jolyn Naponie Bryant, RN pre op nurse if needed [phone]  Main number [phone]               FAILURE TO FOLLOW THESE INSTRUCTIONS MAY RESULT IN THE CANCELLATION OF YOUR SURGERY.     PATIENT   SIGNATURE___________________________________________________

## 2014-05-12 NOTE — Progress Notes (Deleted)
Your procedure is scheduled on: Wednesday, May 17, 2014  Report to [facility] at 0630 AM.    Call this number if you have problems the morning of surgery: [phone]   Spoke with Gwendolyn Lima, Rn supervisor at [facility] fax number [phone] reviewed the pre op instructions on Friday, May 06-2014. The pre op instructions will be faxed today with a follow up call to confirm that the instructions were received and were clear.    Remember: Bring insurance card and picture ID, PLEASE SEND CURRENT MEDICATION LIST AND TIME LAST MEDICATIONS TAKEN  WITH PATIENT MORNING OF SURGERY.   Do not drink liquids or  eat food:After Midnight.Tuesday night, May 16, 2014     Take these medicines the morning of surgery with A SIP OF WATER: Metoprolol,Sinemet,Namenda with sip of water may take Tramadol if needed for pain                                 SEE [place] PREPARING FOR SURGERY SHEET    Do not wear jewelry, make-up or nail polish.                Your procedure is scheduled on:   [facility] at AM.  Call this number if you have problems the morning of surgery: [phone]   Remember: Bring insurance card and picture ID, PLEASE SEND CURRENT MEDICATION LIST AND TIME LAST MEDICATIONS TAKEN  WITH PATIENT MORNING OF SURGERY.  Do not drink liquids or  eat food:After Midnight.    Take these medicines the morning of surgery with A SIP OF WATER:                               SEE [place] PREPARING FOR SURGERY SHEET    Do not wear jewelry, make-up or nail polish.  Do not wear lotions, powders, or perfumes. You may wear deodorant.             Men may shave face and neck.  Do not bring valuables to the hospital.  Contacts, dentures or bridgework may not be worn into surgery.  Leave suitcase in the car. After surgery it may be brought to your room.  For patients admitted to the hospital, checkout time is 11:00 AM the day of  discharge.   Patients discharged the day of surgery will not be allowed to drive home.  Name and phone number of your driver:                  Your procedure is scheduled on:   [facility] at AM.  Call this number if you have problems the morning of surgery: [phone]   Remember: Bring insurance card and picture ID, PLEASE SEND CURRENT MEDICATION LIST AND TIME LAST MEDICATIONS TAKEN  WITH  PATIENT MORNING OF SURGERY.  Do not drink liquids or  eat food:After Midnight.    Take these medicines the morning of surgery with A SIP OF WATER:                               SEE [place] PREPARING FOR SURGERY SHEET  Do not wear jewelry, make-up or nail polish.  Do not wear lotions, powders, or perfumes. You may wear deodorant.             Men may shave face and neck.  Do not bring valuables to the hospital.  Contacts, dentures or bridgework may not be worn into surgery.  Leave suitcase in the car. After surgery it may be brought to your room.  For patients admitted to the hospital, checkout time is 11:00 AM the day of discharge.   Patients discharged the day of surgery will not be allowed to drive home.  Name and phone number of your driver:                      Call Jolyn Naponie Sherlie Boyum, RN pre op nurse if needed [phone]               FAILURE TO FOLLOW THESE INSTRUCTIONS MAY RESULT IN THE            CANCELLATION OF YOUR SURGERY.    PATIENT   SIGNATURE___________________________________________________                                   Your procedure is scheduled on:   [facility].  Call this number if you have problems the morning of surgery: [phone]   Remember: Bring insurance card and picture ID, PLEASE SEND CURRENT                  MEDICATION LIST AND TIME LAST MEDICATIONS TAKEN  WITH                             PATIENT MORNING OF SURGERY.  Do not drink liquids or  eat food:After Midnight.    Take these medicines the  morning of surgery with A SIP OF WATER:                               SEE [place] PREPARING FOR SURGERY SHEET    Do not wear jewelry, make-up or nail polish.  Do not wear lotions, powders, or perfumes. You may wear deodorant.             Men may shave face and neck.  Do not bring valuables to the hospital.  Contacts, dentures or bridgework may not be worn into surgery.  Leave suitcase in the car. After surgery it may be brought to your room.  For patients admitted to the hospital, checkout time is 11:00 AM the day of                         discharge.   Patients discharged the day of surgery will not be allowed to drive home.  Name and phone number of your driver:      Please read over the following fact sheets that you were given:MRSA Information                    Call Jolyn Naponie Denica Web, RN pre op nurse if needed [phone]               FAILURE TO FOLLOW THESE INSTRUCTIONS MAY RESULT IN THE  CANCELLATION OF YOUR SURGERY.    PATIENT   SIGNATURE___________________________________________________                                           Call Jolyn Naponie Soffia Doshier, RN pre op nurse if needed [phone]               FAILURE TO FOLLOW THESE INSTRUCTIONS MAY RESULT IN THE CANCELLATION OF YOUR SURGERY.    PATIENT   SIGNATURE___________________________________________________                       Do not wear lotions, powders, or perfumes. You may wear deodorant.             Men may shave face and neck.  Do not bring valuables to the hospital.  Contacts, dentures or bridgework may not be worn into surgery.  Leave suitcase in the car. After surgery it may be brought to your room.  For patients admitted to the hospital, checkout time is 11:00 AM the day of discharge.   Patients discharged the day of surgery will not be allowed to drive home.   Name and phone number of your driver: [facility] will provide transportation [phone]  Wife Barbee CoughMargaret Isley will not be able  to come the day of surgery                         Call Jolyn Naponie Lulie Hurd, RN pre op nurse if needed [phone]  Main Number [phone]               FAILURE TO FOLLOW THESE INSTRUCTIONS MAY RESULT IN THE CANCELLATION OF YOUR SURGERY.     PATIENT   SIGNATURE___________________________________________________                   OT Cancellation Note  Patient Details Name: Gwyneth SproutWilliam Wendland MRN: [id number] DOB: [birth year]   Cancelled Treatment:       Steve RattlerBryant, Benny Deutschman Blackwell 05/12/2014, 2:52 PM

## 2014-05-16 ENCOUNTER — Other Ambulatory Visit: Payer: Self-pay | Admitting: Radiology

## 2014-05-16 MED ORDER — GENTAMICIN SULFATE 40 MG/ML IJ SOLN
440.0000 mg | INTRAVENOUS | Status: AC
  Administered 2014-05-17: 440 mg via INTRAVENOUS
  Filled 2014-05-16: qty 11

## 2014-05-17 ENCOUNTER — Inpatient Hospital Stay (HOSPITAL_COMMUNITY)
Admission: RE | Admit: 2014-05-17 | Discharge: 2014-05-20 | DRG: 660 | Disposition: A | Discharge status: Skilled Nursing Facility | Payer: Medicare Other | Source: Ambulatory Visit | Attending: Urology | Admitting: Urology

## 2014-05-17 ENCOUNTER — Ambulatory Visit (HOSPITAL_COMMUNITY)
Admission: RE | Admit: 2014-05-17 | Discharge: 2014-05-17 | Disposition: A | Discharge status: Home / Self Care | Payer: Medicare Other | Source: Ambulatory Visit | Attending: Urology | Admitting: Urology

## 2014-05-17 ENCOUNTER — Ambulatory Visit (HOSPITAL_COMMUNITY): Payer: Medicare Other

## 2014-05-17 ENCOUNTER — Encounter (HOSPITAL_COMMUNITY): Payer: Self-pay

## 2014-05-17 ENCOUNTER — Ambulatory Visit (HOSPITAL_COMMUNITY): Payer: Medicare Other | Admitting: Anesthesiology

## 2014-05-17 ENCOUNTER — Encounter (HOSPITAL_COMMUNITY)
Admission: RE | Disposition: A | Discharge status: Skilled Nursing Facility | Payer: Self-pay | Source: Ambulatory Visit | Attending: Urology

## 2014-05-17 DIAGNOSIS — Z881 Allergy status to other antibiotic agents status: Secondary | ICD-10-CM

## 2014-05-17 DIAGNOSIS — I10 Essential (primary) hypertension: Secondary | ICD-10-CM | POA: Diagnosis present

## 2014-05-17 DIAGNOSIS — Z882 Allergy status to sulfonamides status: Secondary | ICD-10-CM

## 2014-05-17 DIAGNOSIS — Z880 Allergy status to penicillin: Secondary | ICD-10-CM

## 2014-05-17 DIAGNOSIS — Z87891 Personal history of nicotine dependence: Secondary | ICD-10-CM

## 2014-05-17 DIAGNOSIS — R627 Adult failure to thrive: Secondary | ICD-10-CM | POA: Diagnosis present

## 2014-05-17 DIAGNOSIS — F0390 Unspecified dementia without behavioral disturbance: Secondary | ICD-10-CM | POA: Diagnosis present

## 2014-05-17 DIAGNOSIS — E119 Type 2 diabetes mellitus without complications: Secondary | ICD-10-CM | POA: Diagnosis present

## 2014-05-17 DIAGNOSIS — N200 Calculus of kidney: Secondary | ICD-10-CM

## 2014-05-17 DIAGNOSIS — Z888 Allergy status to other drugs, medicaments and biological substances status: Secondary | ICD-10-CM

## 2014-05-17 DIAGNOSIS — Z86718 Personal history of other venous thrombosis and embolism: Secondary | ICD-10-CM

## 2014-05-17 DIAGNOSIS — Z79891 Long term (current) use of opiate analgesic: Secondary | ICD-10-CM

## 2014-05-17 DIAGNOSIS — E860 Dehydration: Secondary | ICD-10-CM | POA: Diagnosis present

## 2014-05-17 DIAGNOSIS — R630 Anorexia: Secondary | ICD-10-CM | POA: Diagnosis present

## 2014-05-17 DIAGNOSIS — N202 Calculus of kidney with calculus of ureter: Principal | ICD-10-CM | POA: Diagnosis present

## 2014-05-17 DIAGNOSIS — R1114 Bilious vomiting: Secondary | ICD-10-CM

## 2014-05-17 DIAGNOSIS — Z87442 Personal history of urinary calculi: Secondary | ICD-10-CM

## 2014-05-17 DIAGNOSIS — I5042 Chronic combined systolic (congestive) and diastolic (congestive) heart failure: Secondary | ICD-10-CM | POA: Diagnosis present

## 2014-05-17 DIAGNOSIS — Z79899 Other long term (current) drug therapy: Secondary | ICD-10-CM

## 2014-05-17 DIAGNOSIS — N179 Acute kidney failure, unspecified: Secondary | ICD-10-CM | POA: Diagnosis present

## 2014-05-17 HISTORY — PX: NEPHROLITHOTOMY: SHX5134

## 2014-05-17 LAB — GLUCOSE, CAPILLARY
GLUCOSE-CAPILLARY: 145 mg/dL — AB (ref 70–99)
GLUCOSE-CAPILLARY: 141 mg/dL — AB (ref 70–99)
Glucose-Capillary: 143 mg/dL — ABNORMAL HIGH (ref 70–99)

## 2014-05-17 LAB — BASIC METABOLIC PANEL
Anion gap: 7 (ref 5–15)
BUN: 40 mg/dL — ABNORMAL HIGH (ref 6–20)
CO2: 22 mmol/L (ref 22–32)
Calcium: 8.9 mg/dL (ref 8.9–10.3)
Chloride: 107 mmol/L (ref 101–111)
Creatinine, Ser: 1.83 mg/dL — ABNORMAL HIGH (ref 0.61–1.24)
GFR calc Af Amer: 40 mL/min — ABNORMAL LOW (ref >60)
GFR calc non Af Amer: 35 mL/min — ABNORMAL LOW (ref >60)
GLUCOSE: 167 mg/dL — AB (ref 70–99)
Potassium: 4.1 mmol/L (ref 3.5–5.1)
Sodium: 136 mmol/L (ref 135–145)

## 2014-05-17 LAB — CBC
HCT: 38.1 % — ABNORMAL LOW (ref 39.0–52.0)
Hemoglobin: 12.2 g/dL — ABNORMAL LOW (ref 13.0–17.0)
MCH: 29.4 pg (ref 26.0–34.0)
MCHC: 32 g/dL (ref 30.0–36.0)
MCV: 91.8 fL (ref 78.0–100.0)
PLATELETS: 230 10*3/uL (ref 150–400)
RBC: 4.15 MIL/uL — AB (ref 4.22–5.81)
RDW: 13.7 % (ref 11.5–15.5)
WBC: 11.8 10*3/uL — ABNORMAL HIGH (ref 4.0–10.5)

## 2014-05-17 SURGERY — NEPHROLITHOTOMY PERCUTANEOUS
Anesthesia: General | Laterality: Right

## 2014-05-17 MED ORDER — MORPHINE SULFATE 2 MG/ML IJ SOLN
2.0000 mg | INTRAMUSCULAR | Status: DC | PRN
  Administered 2014-05-18 (×2): 2 mg via INTRAVENOUS
  Filled 2014-05-17 (×2): qty 1

## 2014-05-17 MED ORDER — MEMANTINE HCL ER 28 MG PO CP24
28.0000 mg | ORAL_CAPSULE | Freq: Every day | ORAL | Status: DC
  Administered 2014-05-18 – 2014-05-20 (×3): 28 mg via ORAL
  Filled 2014-05-17 (×3): qty 1

## 2014-05-17 MED ORDER — SODIUM CHLORIDE 0.9 % IR SOLN
Status: DC | PRN
  Administered 2014-05-17: 10000 mL

## 2014-05-17 MED ORDER — LACTATED RINGERS IV SOLN
INTRAVENOUS | Status: DC | PRN
  Administered 2014-05-17: 09:00:00 via INTRAVENOUS

## 2014-05-17 MED ORDER — DEXAMETHASONE SODIUM PHOSPHATE 10 MG/ML IJ SOLN
INTRAMUSCULAR | Status: DC | PRN
  Administered 2014-05-17: 10 mg via INTRAVENOUS

## 2014-05-17 MED ORDER — DEXTROSE 5 % IV SOLN
7.0000 mg/kg | Freq: Once | INTRAVENOUS | Status: AC
  Administered 2014-05-18: 680 mg via INTRAVENOUS
  Filled 2014-05-17: qty 17

## 2014-05-17 MED ORDER — METOPROLOL SUCCINATE ER 50 MG PO TB24
50.0000 mg | ORAL_TABLET | Freq: Once | ORAL | Status: AC
  Administered 2014-05-17: 50 mg via ORAL
  Filled 2014-05-17: qty 1

## 2014-05-17 MED ORDER — PROMETHAZINE HCL 25 MG/ML IJ SOLN: 6.2500 mg | INTRAMUSCULAR | Status: DC | PRN

## 2014-05-17 MED ORDER — FENTANYL CITRATE (PF) 100 MCG/2ML IJ SOLN
25.0000 ug | INTRAMUSCULAR | Status: DC | PRN
  Administered 2014-05-17: 50 ug via INTRAVENOUS

## 2014-05-17 MED ORDER — FENTANYL CITRATE (PF) 100 MCG/2ML IJ SOLN
INTRAMUSCULAR | Status: DC | PRN
  Administered 2014-05-17 (×2): 25 ug via INTRAVENOUS
  Administered 2014-05-17: 50 ug via INTRAVENOUS

## 2014-05-17 MED ORDER — DEXTROSE 5 % IV SOLN
10.0000 mg | INTRAVENOUS | Status: DC | PRN
  Administered 2014-05-17: 20 ug/min via INTRAVENOUS

## 2014-05-17 MED ORDER — ENSURE ENLIVE PO LIQD
237.0000 mL | Freq: Two times a day (BID) | ORAL | Status: DC
  Administered 2014-05-18 – 2014-05-19 (×2): 237 mL via ORAL

## 2014-05-17 MED ORDER — ETOMIDATE 2 MG/ML IV SOLN
INTRAVENOUS | Status: DC | PRN
  Administered 2014-05-17: 10 mg via INTRAVENOUS

## 2014-05-17 MED ORDER — KCL IN DEXTROSE-NACL 20-5-0.45 MEQ/L-%-% IV SOLN
INTRAVENOUS | Status: DC
  Administered 2014-05-17 – 2014-05-18 (×3): via INTRAVENOUS
  Filled 2014-05-17 (×3): qty 1000

## 2014-05-17 MED ORDER — PHENYLEPHRINE HCL 10 MG/ML IJ SOLN
INTRAMUSCULAR | Status: DC | PRN
  Administered 2014-05-17: 120 ug via INTRAVENOUS

## 2014-05-17 MED ORDER — METOPROLOL TARTRATE 1 MG/ML IV SOLN
2.5000 mg | INTRAVENOUS | Status: DC | PRN
  Administered 2014-05-17: 2.5 mg via INTRAVENOUS

## 2014-05-17 MED ORDER — METOPROLOL SUCCINATE ER 50 MG PO TB24
50.0000 mg | ORAL_TABLET | Freq: Every day | ORAL | Status: DC
  Administered 2014-05-17 – 2014-05-19 (×3): 50 mg via ORAL
  Filled 2014-05-17 (×3): qty 1

## 2014-05-17 MED ORDER — FENTANYL CITRATE (PF) 100 MCG/2ML IJ SOLN
25.0000 ug | INTRAMUSCULAR | Status: AC | PRN
  Administered 2014-05-17 (×6): 25 ug via INTRAVENOUS

## 2014-05-17 MED ORDER — SUCCINYLCHOLINE CHLORIDE 20 MG/ML IJ SOLN
INTRAMUSCULAR | Status: DC | PRN
  Administered 2014-05-17: 100 mg via INTRAVENOUS

## 2014-05-17 MED ORDER — FENTANYL CITRATE (PF) 100 MCG/2ML IJ SOLN
INTRAMUSCULAR | Status: AC
  Filled 2014-05-17: qty 2

## 2014-05-17 MED ORDER — FENTANYL CITRATE (PF) 100 MCG/2ML IJ SOLN
INTRAMUSCULAR | Status: AC
Start: 2014-05-17 — End: 2014-05-17
  Filled 2014-05-17: qty 2

## 2014-05-17 MED ORDER — PHENYLEPHRINE 40 MCG/ML (10ML) SYRINGE FOR IV PUSH (FOR BLOOD PRESSURE SUPPORT)
PREFILLED_SYRINGE | INTRAVENOUS | Status: AC
  Filled 2014-05-17: qty 10

## 2014-05-17 MED ORDER — GLYCOPYRROLATE 0.2 MG/ML IJ SOLN
INTRAMUSCULAR | Status: AC
Start: 2014-05-17 — End: 2014-05-17
  Filled 2014-05-17: qty 4

## 2014-05-17 MED ORDER — ONDANSETRON HCL 4 MG/2ML IJ SOLN
INTRAMUSCULAR | Status: AC
  Filled 2014-05-17: qty 2

## 2014-05-17 MED ORDER — CARBIDOPA-LEVODOPA 10-100 MG PO TABS
1.0000 | ORAL_TABLET | Freq: Three times a day (TID) | ORAL | Status: DC
  Administered 2014-05-17 – 2014-05-20 (×9): 1 via ORAL
  Filled 2014-05-17 (×10): qty 1

## 2014-05-17 MED ORDER — PROPOFOL 10 MG/ML IV BOLUS
INTRAVENOUS | Status: AC
  Filled 2014-05-17: qty 20

## 2014-05-17 MED ORDER — CISATRACURIUM BESYLATE (PF) 10 MG/5ML IV SOLN
INTRAVENOUS | Status: DC | PRN
  Administered 2014-05-17: 2 mg via INTRAVENOUS
  Administered 2014-05-17: 4 mg via INTRAVENOUS

## 2014-05-17 MED ORDER — INSULIN DETEMIR 100 UNIT/ML ~~LOC~~ SOLN
4.0000 [IU] | Freq: Every day | SUBCUTANEOUS | Status: DC
  Administered 2014-05-17 – 2014-05-19 (×3): 4 [IU] via SUBCUTANEOUS
  Filled 2014-05-17 (×4): qty 0.04

## 2014-05-17 MED ORDER — PROPOFOL 10 MG/ML IV BOLUS
INTRAVENOUS | Status: DC | PRN
  Administered 2014-05-17: 100 mg via INTRAVENOUS

## 2014-05-17 MED ORDER — ONDANSETRON HCL 4 MG/2ML IJ SOLN
INTRAMUSCULAR | Status: DC | PRN
  Administered 2014-05-17: 4 mg via INTRAVENOUS

## 2014-05-17 MED ORDER — ONDANSETRON HCL 4 MG/2ML IJ SOLN
4.0000 mg | INTRAMUSCULAR | Status: DC | PRN
  Administered 2014-05-18 – 2014-05-19 (×4): 4 mg via INTRAVENOUS
  Filled 2014-05-17 (×4): qty 2

## 2014-05-17 MED ORDER — METOCLOPRAMIDE HCL 5 MG/ML IJ SOLN
INTRAMUSCULAR | Status: AC
  Filled 2014-05-17: qty 2

## 2014-05-17 MED ORDER — HYDROCODONE-ACETAMINOPHEN 5-325 MG PO TABS: 1.0000 | ORAL_TABLET | Freq: Four times a day (QID) | ORAL | Status: DC | PRN

## 2014-05-17 MED ORDER — ETOMIDATE 2 MG/ML IV SOLN
INTRAVENOUS | Status: AC
  Filled 2014-05-17: qty 10

## 2014-05-17 MED ORDER — METOPROLOL TARTRATE 1 MG/ML IV SOLN: 2.5000 mg | INTRAVENOUS | Status: DC | PRN

## 2014-05-17 MED ORDER — SENNA 8.6 MG PO TABS
1.0000 | ORAL_TABLET | Freq: Every day | ORAL | Status: DC
  Administered 2014-05-17 – 2014-05-19 (×3): 8.6 mg via ORAL
  Filled 2014-05-17 (×3): qty 1

## 2014-05-17 MED ORDER — CISATRACURIUM BESYLATE 20 MG/10ML IV SOLN
INTRAVENOUS | Status: AC
  Filled 2014-05-17: qty 10

## 2014-05-17 MED ORDER — METOPROLOL TARTRATE 1 MG/ML IV SOLN
INTRAVENOUS | Status: AC
  Filled 2014-05-17: qty 5

## 2014-05-17 MED ORDER — DEXAMETHASONE SODIUM PHOSPHATE 10 MG/ML IJ SOLN
INTRAMUSCULAR | Status: AC
  Filled 2014-05-17: qty 1

## 2014-05-17 MED ORDER — SODIUM CHLORIDE 0.9 % IJ SOLN
10.0000 mL | INTRAMUSCULAR | Status: DC | PRN
  Administered 2014-05-18: 10 mL
  Filled 2014-05-17: qty 40

## 2014-05-17 MED ORDER — LACTATED RINGERS IV SOLN: INTRAVENOUS | Status: DC

## 2014-05-17 MED ORDER — BISACODYL 5 MG PO TBEC: 5.0000 mg | DELAYED_RELEASE_TABLET | Freq: Every day | ORAL | Status: DC | PRN

## 2014-05-17 MED ORDER — ROPINIROLE HCL 0.5 MG PO TABS
0.5000 mg | ORAL_TABLET | Freq: Three times a day (TID) | ORAL | Status: DC
  Administered 2014-05-17 – 2014-05-20 (×9): 0.5 mg via ORAL
  Filled 2014-05-17 (×10): qty 1

## 2014-05-17 MED ORDER — MEPERIDINE HCL 50 MG/ML IJ SOLN: 6.2500 mg | INTRAMUSCULAR | Status: DC | PRN

## 2014-05-17 MED ORDER — METOCLOPRAMIDE HCL 5 MG/ML IJ SOLN
INTRAMUSCULAR | Status: DC | PRN
  Administered 2014-05-17: 10 mg via INTRAVENOUS

## 2014-05-17 MED ORDER — IOHEXOL 300 MG/ML  SOLN
INTRAMUSCULAR | Status: DC | PRN
  Administered 2014-05-17: 50 mL

## 2014-05-17 MED ORDER — ACETAMINOPHEN 500 MG PO TABS
1000.0000 mg | ORAL_TABLET | Freq: Three times a day (TID) | ORAL | Status: DC | PRN
  Administered 2014-05-18: 1000 mg via ORAL
  Filled 2014-05-17: qty 2

## 2014-05-17 SURGICAL SUPPLY — 46 items
BAG URINE DRAINAGE (UROLOGICAL SUPPLIES) ×3 IMPLANT
BASKET ZERO TIP NITINOL 2.4FR (BASKET) IMPLANT
BENZOIN TINCTURE PRP APPL 2/3 (GAUZE/BANDAGES/DRESSINGS) ×6 IMPLANT
CATH FOLEY 2W COUNCIL 20FR 5CC (CATHETERS) IMPLANT
CATH FOLEY 2W COUNCIL 5CC 18FR (CATHETERS) ×3 IMPLANT
CATH ROBINSON RED A/P 20FR (CATHETERS) IMPLANT
CATH X-FORCE N30 NEPHROSTOMY (TUBING) ×3 IMPLANT
COVER SURGICAL LIGHT HANDLE (MISCELLANEOUS) ×3 IMPLANT
DRAPE C-ARM 42X120 X-RAY (DRAPES) ×3 IMPLANT
DRAPE LINGEMAN PERC (DRAPES) ×3 IMPLANT
DRAPE SURG IRRIG POUCH 19X23 (DRAPES) ×3 IMPLANT
DRSG PAD ABDOMINAL 8X10 ST (GAUZE/BANDAGES/DRESSINGS) ×6 IMPLANT
DRSG TEGADERM 8X12 (GAUZE/BANDAGES/DRESSINGS) ×6 IMPLANT
FIBER LASER FLEXIVA 1000 (UROLOGICAL SUPPLIES) IMPLANT
FIBER LASER FLEXIVA 200 (UROLOGICAL SUPPLIES) IMPLANT
FIBER LASER FLEXIVA 365 (UROLOGICAL SUPPLIES) IMPLANT
FIBER LASER FLEXIVA 550 (UROLOGICAL SUPPLIES) IMPLANT
FIBER LASER TRAC TIP (UROLOGICAL SUPPLIES) IMPLANT
GAUZE SPONGE 4X4 12PLY STRL (GAUZE/BANDAGES/DRESSINGS) ×3 IMPLANT
GLOVE BIOGEL M STRL SZ7.5 (GLOVE) ×3 IMPLANT
GOWN STRL REUS W/TWL XL LVL3 (GOWN DISPOSABLE) ×6 IMPLANT
GUIDEWIRE AMPLAZ .035X145 (WIRE) ×6 IMPLANT
GUIDEWIRE STR DUAL SENSOR (WIRE) ×3 IMPLANT
KIT BASIN OR (CUSTOM PROCEDURE TRAY) ×3 IMPLANT
MANIFOLD NEPTUNE II (INSTRUMENTS) ×3 IMPLANT
NS IRRIG 1000ML POUR BTL (IV SOLUTION) IMPLANT
PACK BASIC VI WITH GOWN DISP (CUSTOM PROCEDURE TRAY) ×3 IMPLANT
POSITIONER SURGICAL ARM (MISCELLANEOUS) IMPLANT
PROBE LITHOCLAST ULTRA 3.8X403 (UROLOGICAL SUPPLIES) ×3 IMPLANT
PROBE PNEUMATIC 1.0MMX570MM (UROLOGICAL SUPPLIES) ×3 IMPLANT
SET IRRIG Y TYPE TUR BLADDER L (SET/KITS/TRAYS/PACK) ×3 IMPLANT
SET WARMING FLUID IRRIGATION (MISCELLANEOUS) IMPLANT
SHEATH PEELAWAY SET 9 (SHEATH) ×3 IMPLANT
SHEATH X FORCE 10MMX22CM (SHEATH) ×3 IMPLANT
SPONGE LAP 4X18 X RAY DECT (DISPOSABLE) ×3 IMPLANT
STONE CATCHER W/TUBE ADAPTER (UROLOGICAL SUPPLIES) ×3 IMPLANT
SUT SILK 2 0 30  PSL (SUTURE) ×2
SUT SILK 2 0 30 PSL (SUTURE) ×1 IMPLANT
SYR 20CC LL (SYRINGE) ×6 IMPLANT
SYRINGE 10CC LL (SYRINGE) ×3 IMPLANT
TOWEL OR 17X26 10 PK STRL BLUE (TOWEL DISPOSABLE) ×3 IMPLANT
TOWEL OR NON WOVEN STRL DISP B (DISPOSABLE) ×3 IMPLANT
TRAY FOLEY BAG SILVER LF 16FR (CATHETERS) ×3 IMPLANT
TUBING CONNECTING 10 (TUBING) ×6 IMPLANT
TUBING CONNECTING 10' (TUBING) ×3
WATER STERILE IRR 1500ML POUR (IV SOLUTION) IMPLANT

## 2014-05-17 NOTE — Anesthesia Postprocedure Evaluation (Signed)
  Anesthesia Post-op Note  Patient: Henry Wyatt  Procedure(s) Performed: Procedure(s) (LRB): NEPHROLITHOTOMY PERCUTANEOUS (Right)  Patient Location: PACU  Anesthesia Type: General  Level of Consciousness: awake and alert   Airway and Oxygen Therapy: Patient Spontanous Breathing  Post-op Pain: mild  Post-op Assessment: Post-op Vital signs reviewed, Patient's Cardiovascular Status Stable, Respiratory Function Stable, Patent Airway and No signs of Nausea or vomiting  Last Vitals:  Filed Vitals:   05/17/14 1239  BP: 151/92  Pulse: 42  Temp:   Resp: 14    Post-op Vital Signs: stable   Complications: No apparent anesthesia complications

## 2014-05-17 NOTE — Anesthesia Preprocedure Evaluation (Signed)
Anesthesia Evaluation  Patient identified by MRN, date of birth, ID band Patient confused  General Assessment Comment: Hypertension     .  Diabetes mellitus without complication     .  Constipation     .  DVT (deep venous thrombosis)     .  Back pain     .  Kidney injury     Reviewed: Allergy & Precautions, H&P , NPO status , Patient's Chart, lab work & pertinent test results  Airway Mallampati: II  TM Distance: >3 FB Neck ROM: Full    Dental no notable dental hx.    Pulmonary neg pulmonary ROS, pneumonia -, former smoker,  breath sounds clear to auscultation  Pulmonary exam normal       Cardiovascular hypertension, Pt. on medications +CHF negative cardio ROS Normal cardiovascular examRhythm:Regular Rate:Normal  EF 35%   Neuro/Psych PSYCHIATRIC DISORDERS negative neurological ROS  negative psych ROS   GI/Hepatic negative GI ROS, Neg liver ROS,   Endo/Other  negative endocrine ROSdiabetes, Type 1, Insulin Dependent  Renal/GU Renal diseasenegative Renal ROS  negative genitourinary   Musculoskeletal negative musculoskeletal ROS (+)   Abdominal   Peds negative pediatric ROS (+)  Hematology negative hematology ROS (+)   Anesthesia Other Findings   Reproductive/Obstetrics negative OB ROS                             Anesthesia Physical  Anesthesia Plan  ASA: IV  Anesthesia Plan: General   Post-op Pain Management:    Induction: Intravenous  Airway Management Planned: Oral ETT  Additional Equipment:   Intra-op Plan:   Post-operative Plan: Extubation in OR  Informed Consent: I have reviewed the patients History and Physical, chart, labs and discussed the procedure including the risks, benefits and alternatives for the proposed anesthesia with the patient or authorized representative who has indicated his/her understanding and acceptance.   Dental advisory given  Plan Discussed  with: CRNA  Anesthesia Plan Comments:         Anesthesia Quick Evaluation

## 2014-05-17 NOTE — Anesthesia Procedure Notes (Signed)
Procedure Name: Intubation Date/Time: 05/17/2014 9:43 AM Performed by: Paulla DollyJOYCE, Ulrick Methot A Pre-anesthesia Checklist: Patient identified, Emergency Drugs available, Suction available, Patient being monitored and Timeout performed Patient Re-evaluated:Patient Re-evaluated prior to inductionOxygen Delivery Method: Circle system utilized Preoxygenation: Pre-oxygenation with 100% oxygen Intubation Type: IV induction Ventilation: Mask ventilation without difficulty Laryngoscope Size: Mac and 4 Grade View: Grade I Tube type: Oral Tube size: 7.5 mm Number of attempts: 1 Airway Equipment and Method: Stylet Placement Confirmation: ETT inserted through vocal cords under direct vision,  positive ETCO2 and breath sounds checked- equal and bilateral Secured at: 21 cm Tube secured with: Tape Dental Injury: Teeth and Oropharynx as per pre-operative assessment

## 2014-05-17 NOTE — Progress Notes (Signed)
ANTIBIOTIC CONSULT NOTE - INITIAL  Pharmacy Consult for Gentamicin Indication: Urinary infection  Allergies  Allergen Reactions  . Carbapenems Other (See Comments)    Per nursing home [facility]  . Cephalosporins Other (See Comments)    Per nursing home mar  . Erythromycin Other (See Comments)    Per nursing home mar  . Penicillins Other (See Comments)    Per nursing home mar  . Sulfa Antibiotics Other (See Comments)    Per nursing home mar    Patient Measurements: Height: 6\' 1"  (185.4 cm) Weight: 274 lb 8 oz (124.512 kg) IBW/kg (Calculated) : 79.9 Adjusted Body Weight: 97.7 kg  Vital Signs: Temp: 97.4 F (36.3 C) (05/11 1255) Temp Source: Oral (05/11 0809) BP: 139/60 mmHg (05/11 1255) Pulse Rate: 75 (05/11 1255) Intake/Output from previous day:   Intake/Output from this shift: Total I/O In: 1000 [I.V.:1000] Out: 490 [Urine:490]  Labs:  Recent Labs  05/17/14 0823  WBC 11.8*  HGB 12.2*  PLT 230  CREATININE 1.83*   Estimated Creatinine Clearance: 48.9 mL/min (by C-G formula based on Cr of 1.83). No results for input(s): VANCOTROUGH, VANCOPEAK, VANCORANDOM, GENTTROUGH, GENTPEAK, GENTRANDOM, TOBRATROUGH, TOBRAPEAK, TOBRARND, AMIKACINPEAK, AMIKACINTROU, AMIKACIN in the last 72 hours.   Microbiology: Recent Results (from the past 720 hour(s))  Blood Culture (routine x 2)     Status: None   Collection Time: 04/17/14  2:44 PM  Result Value Ref Range Status   Specimen Description BLOOD BLOOD RIGHT FOREARM  Final   Special Requests BOTTLES DRAWN AEROBIC AND ANAEROBIC 10MLS  Final   Culture   Final    NO GROWTH 5 DAYS Performed at [facility]    Report Status 04/23/2014 FINAL  Final  MRSA PCR Screening     Status: None   Collection Time: 04/17/14  4:45 PM  Result Value Ref Range Status   MRSA by PCR NEGATIVE NEGATIVE Final    Comment:        The GeneXpert MRSA Assay (FDA approved for NASAL specimens only), is one component of a comprehensive MRSA  colonization surveillance program. It is not intended to diagnose MRSA infection nor to guide or monitor treatment for MRSA infections.   Urine culture     Status: None   Collection Time: 04/17/14  9:15 PM  Result Value Ref Range Status   Specimen Description URINE, RANDOM  Final   Special Requests URINE FROM RIGHT KIDNEY  Final   Colony Count   Final    >=100,000 COLONIES/ML Performed at [facility]    Culture   Final    PROTEUS MIRABILIS Performed at [facility]    Report Status 04/20/2014 FINAL  Final   Organism ID, Bacteria PROTEUS MIRABILIS  Final      Susceptibility   Proteus mirabilis - MIC*    AMPICILLIN >=32 RESISTANT Resistant     CEFAZOLIN <=4 SENSITIVE Sensitive     CEFTRIAXONE <=1 SENSITIVE Sensitive     CIPROFLOXACIN >=4 RESISTANT Resistant     GENTAMICIN <=1 SENSITIVE Sensitive     LEVOFLOXACIN >=8 RESISTANT Resistant     NITROFURANTOIN RESISTANT      TOBRAMYCIN <=1 SENSITIVE Sensitive     TRIMETH/SULFA >=320 RESISTANT Resistant     PIP/TAZO <=4 SENSITIVE Sensitive     * PROTEUS MIRABILIS    Medical History: Past Medical History  Diagnosis Date  . Hypertension   . Diabetes mellitus without complication   . Constipation   . DVT (deep venous thrombosis)   .  Back pain   . Septic shock     01/05/13- admitted to hospital   . Diarrhea     admitted to hospital on 01/05/13   . Pneumonia     health care associated- 01/05/13   . Altered mental status     01/05/13   . C. difficile diarrhea     01/05/13- admitted to hospital   . Influenza A     01/05/13 admitted to hospital   . Hypokalemia   . Dysphagia   . 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 Dementia   . Kidney injury   . Kidney stone     01/05/13   . Hydronephrosis     left kidney 01/05/13   . Acute kidney injury     01/05/13   . Acute pyelonephritis     01/05/13     Medications:  Anti-infectives    Start     Dose/Rate  Route Frequency Ordered Stop   05/17/14 0600  gentamicin (GARAMYCIN) 440 mg in dextrose 5 % 100 mL IVPB     440 mg 222 mL/hr over 30 Minutes Intravenous 30 min pre-op 05/16/14 1341 05/17/14 0930     Assessment: 73 yoM admitted 5/11 s/p perc nephrolithotomy.  PMH includes AMS with urosepsis from left ureteral stone requiring emergency stent and laser lithotripsy 01/2013.  He more recently had Proteus pyelonephritis complicated by obstructing nephrolithiasis 04/2014 and was on Aztreonam IV from 04/19/14 - 05/03/14.  He received gentamicin pre-operative antibiotic.  Pharmacy is now consulted to dose gentamicin for possible UTI.  Note reported allergies (unknown reaction) to Carbapenems, Cephs, Erythromycin, PCN, and Sulfa.  5/11 >> Gentamicin >>    Today, 05/17/2014:  Tmax: afebrile  WBC: 11.8  Renal: SCr 1.83, CrCl ~ 49 ml/min CG, ~ 36 ml/min N.  First pre-op dose Gent 440mg  given 5/11 at 0930.  Goal of Therapy:  Gentamicin trough level <2 mcg/ml  Plan:   Gentamicin 680mg  IV x1 dose, next dose 5/12 at 1000  Check Gentamicin trough level as needed.  Follow up renal fxn, culture results, and clinical course.  Lynann Beaverhristine Keymani Glynn PharmD, BCPS Pager [phone] 05/17/2014 1:51 PM

## 2014-05-17 NOTE — Progress Notes (Signed)
Patient's clothing, wheelchair, and Maxi Move lift pad taken to room 1411 from room 5 in Short Stay.

## 2014-05-17 NOTE — Interval H&P Note (Signed)
History and Physical Interval Note:  05/17/2014 9:05 AM  Henry Wyatt  has presented today for surgery, with the diagnosis of RIGHT RENAL / PROXIMAL URETERAL CALCULI  The various methods of treatment have been discussed with the patient and family. After consideration of risks, benefits and other options for treatment, the patient has consented to  Procedure(s): NEPHROLITHOTOMY PERCUTANEOUS (Right) HOLMIUM LASER APPLICATION (Right) as a surgical intervention .  The patient's history has been reviewed, patient examined, no change in status, stable for surgery.  I have reviewed the patient's chart and labs.  Questions were answered to the patient's satisfaction.     [name] S

## 2014-05-17 NOTE — Transfer of Care (Signed)
Immediate Anesthesia Transfer of Care Note  Patient: Henry Wyatt  Procedure(s) Performed: Procedure(s): NEPHROLITHOTOMY PERCUTANEOUS (Right)  Patient Location: PACU  Anesthesia Type:General  Level of Consciousness: awake, sedated and patient cooperative  Airway & Oxygen Therapy: Patient Spontanous Breathing and Patient connected to face mask oxygen  Post-op Assessment: Report given to RN and Post -op Vital signs reviewed and stable  Post vital signs: Reviewed and stable  Last Vitals:  Filed Vitals:   05/17/14 0809  BP: 164/90  Pulse: 69  Temp: 36.5 C  Resp: 18    Complications: No apparent anesthesia complications

## 2014-05-17 NOTE — Op Note (Signed)
Preoperative diagnosis:3 cm proximal right ureteral calculus with h/o recurrent urosepsis Postoperative diagnosis: Same Procedure: Percutaneous nephrolithotomy right side  Surgeon: Valetta Fulleravid S. Jagjit Riner M.D.  Anesthesia: Gen.  Indications: Mr. Grant RutsLavelle is 74 years of age. Approximately a year ago he presented with urosepsis and had a fairly large stone in his contralateral left ureter with resultant urosepsis. That required urgent stent placement with treatment of his sepsis and subsequent definitive ureteroscopy. The patient presented approximately year later with a fairly similar scenario this time with a very large 3 cm stone in his proximal right ureter. A nephrostomy tube was placed on urgent basis by interventional radiology to decompress the system. The patient subsequently had resolution of his urosepsis and went back to his nursing facility. He was seen and evaluated in our office recently. On KUB imaging the nephrostomy tube appear to be in good position and a faint 3 cm calcification was noted in the proximal right ureter. Urine culture in our office from the nephrostomy tube showed no growth.     Technique and findings: Patient was brought the operating room. He had successful induction of general endotracheal anesthesia. Foley catheter was placed. He was then carefully placed in the prone position making sure all extremities were carefully padded. He was then prepped and draped in usual manner. Appropriate surgical timeout was performed. Interventional radiology was available to beginning of the case to make sure that access was acceptable. Through the nephrostomy tube guidewire was placed down to the bladder along with a safety wire. Access sheath was then placed into the kidney utilizing balloon dilation with placement of the access sheath. Stone was encountered utilizing a rigid nephroscope. Stone was very crumbly and soft consistent with a probable struvite stone. There was no evidence of  collecting system perforation. We removed the rigid nephroscope and inserted a flexible cystoscope. I was able to get down several centimeters down the proximal ureter. I saw no obvious fragments or stones within the proximal ureter. Over one of the safety wires I placed a Kumpe catheter to the bladder to have access if needed. A council tip Foley catheter 18 [place] was placed as a nephrostomy tube. No gross evidence of extravasation. This was secured to the patient's scanning he was brought to recovery room in stable condition.

## 2014-05-17 NOTE — Progress Notes (Addendum)
[facility] to [facility] status and meds.   Phone [phone]

## 2014-05-17 NOTE — H&P (View-Only) (Signed)
Urology Consult   Physician requesting consult: Dr. Gherghe, MD  Reason for consult: Right ureteral stone  History of Present Illness: Henry Wyatt is a 73 y.o. male with history significant history for diastolic/systolic heart failure, dementia, diabetes, prior DVT, and history of nephrolithiasis in the past (most recently 12/2012) who presented to ED with several days of waxing and waning mental status and anorexia. His presentation was similar to his presentation in 12/2012 when he had urosepsis from a left ureteral stone that was emergently stented and then treated with laser lithotripsy on 01/19/2013.   He had a low grade temp to 101 in the ambulance. His temp was 99.6 in the ER. His vitals are all stable. He was given levaquin and vancomycin in ER. His mentation is poor.   History is obtained from medical record and nursing staff due to his mental status and lack of family at bedside.   Past Medical History  Diagnosis Date  . Hypertension   . Diabetes mellitus without complication   . Constipation   . DVT (deep venous thrombosis)   . Back pain   . Kidney injury   . Septic shock     01/05/13- admitted to hospital   . Diarrhea     admitted to hospital on 01/05/13   . Pneumonia     health care associated- 01/05/13   . Kidney stone     01/05/13   . Hydronephrosis     left kidney 01/05/13   . Altered mental status     01/05/13   . Acute kidney injury     01/05/13   . C. difficile diarrhea     01/05/13- admitted to hospital   . Influenza A     01/05/13 admitted to hospital   . Acute pyelonephritis     01/05/13   . Hypokalemia   . Dysphagia   . 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 Dementia     Past Surgical History  Procedure Laterality Date  . Cystoscopy w/ ureteral stent placement Left 01/05/2013    Procedure: CYSTOSCOPY WITH RETROGRADE PYELOGRAM/URETERAL STENT PLACEMENT;  Surgeon: Khadim Lundberg S Grapey, MD;  Location: [facility]  [facility];  Service: Urology;  Laterality: Left;  . Back surgery    . Cystoscopy with ureteroscopy Left 01/19/2013    Procedure: CYSTOSCOPY WITH URETEROSCOPY;  Surgeon: Bohdi Leeds S Grapey, MD;  Location: [facility]S;  Service: Urology;  Laterality: Left;  REMOVAL OF JJ STENT      . Holmium laser application Left 01/19/2013    Procedure: HOLMIUM LASER APPLICATION;  Surgeon: Dresean Beckel S Grapey, MD;  Location: [facility];  Service: Urology;  Laterality: Left;    Medications:  Home meds:    Medication List    ASK your doctor about these medications        acetaminophen 500 MG tablet  Commonly known as:  TYLENOL  Take 1,000 mg by mouth every 8 (eight) hours as needed for mild pain.     bisacodyl 5 MG EC tablet  Commonly known as:  DULCOLAX  Take 5-10 mg by mouth daily as needed for moderate constipation.     carbidopa-levodopa 10-100 MG per tablet  Commonly known as:  SINEMET IR  Take 1 tablet by mouth 3 (three) times daily.     LEVEMIR 100 UNIT/ML injection  Generic drug:  insulin detemir  Inject 4 Units into the skin daily.     memantine 28 MG Cp24   24 hr capsule  Commonly known as:  NAMENDA XR  Take 28 mg by mouth daily.     metoprolol succinate 25 MG 24 hr tablet  Commonly known as:  TOPROL-XL  Take 25 mg by mouth daily.     mineral oil liquid  Place 0.1 mLs into both ears daily.     nitroGLYCERIN 0.4 MG SL tablet  Commonly known as:  NITROSTAT  Place 0.4 mg under the tongue every 5 (five) minutes as needed for chest pain.     rOPINIRole 0.5 MG tablet  Commonly known as:  REQUIP  Take 0.5 mg by mouth 3 (three) times daily.     senna 8.6 MG Tabs tablet  Commonly known as:  SENOKOT  Take 1 tablet by mouth at bedtime.     traMADol 50 MG tablet  Commonly known as:  ULTRAM  Take 100 mg by mouth 2 (two) times daily as needed.        Scheduled Meds: . carbidopa-levodopa  1 tablet Oral TID  . heparin  5,000 Units Subcutaneous 3 times per day  . insulin aspart  0-9 Units Subcutaneous  6 times per day  . insulin detemir  4 Units Subcutaneous QHS  . levofloxacin (LEVAQUIN) IV  750 mg Intravenous Q48H  . [START ON 04/18/2014] memantine  28 mg Oral Daily  . [START ON 04/18/2014] metoprolol succinate  25 mg Oral Daily  . [START ON 04/18/2014] mineral oil  0.1 mL Otic Daily  . rOPINIRole  0.5 mg Oral TID  . senna  1 tablet Oral QHS  . sodium chloride  3 mL Intravenous Q12H  . sodium chloride  3 mL Intravenous Q12H  . vancomycin  1,500 mg Intravenous Once  . [START ON 04/18/2014] vancomycin  1,000 mg Intravenous Q24H   Continuous Infusions:  PRN Meds:.sodium chloride, acetaminophen, alum & mag hydroxide-simeth, bisacodyl, ondansetron **OR** ondansetron (ZOFRAN) IV, sodium chloride, traMADol  Allergies:  Allergies  Allergen Reactions  . Carbapenems Other (See Comments)    Per [facility]  . Cephalosporins Other (See Comments)    Per [facility]  . Erythromycin Other (See Comments)    Per [facility]  . Penicillins Other (See Comments)    Per [facility]  . Sulfa Antibiotics Other (See Comments)    Per [facility]    History reviewed. No pertinent family history.  Social History:  reports that he has quit smoking. He does not have any smokeless tobacco history on file. He reports that he does not drink alcohol or use illicit drugs.  ROS: A complete review of systems was performed.  All systems are negative except for pertinent findings as noted.  Physical Exam:  Vital signs in last 24 hours: Temp:  [99.6 F (37.6 C)] 99.6 F (37.6 C) (04/11 1249) Pulse Rate:  [74-85] 74 (04/11 1715) Resp:  [14-25] 19 (04/11 1715) BP: (118-183)/(70-135) 137/70 mmHg (04/11 1715) SpO2:  [92 %-97 %] 95 % (04/11 1715) Weight:  [200 lb (90.719 kg)] 200 lb (90.719 kg) (04/11 1324) Constitutional:  Arousable but not able to interact in any meaningful way. Cardiovascular: Regular rate and rhythm. Respiratory: Normal respiratory effort, Lungs clear  bilaterally GI: Abdomen is soft, nontender, nondistended, no abdominal masses Genitourinary: No CVAT.  Neurologic: Grossly intact, no focal deficits Psychiatric: Altered  Laboratory Data:   Recent Labs  04/17/14 1315  WBC 11.4*  HGB 12.2*  HCT 37.8*  PLT 201     Recent Labs    04/17/14 1315  NA 137  K 4.3  CL 104  GLUCOSE 188*  BUN 51*  CALCIUM 8.6  CREATININE 3.14*  Cr 01/12/13 - 1.4  Urinalysis    Component Value Date/Time   COLORURINE YELLOW 04/17/2014 1259   APPEARANCEUR TURBID* 04/17/2014 1259   LABSPEC 1.018 04/17/2014 1259   PHURINE 6.0 04/17/2014 1259   GLUCOSEU NEGATIVE 04/17/2014 1259   HGBUR MODERATE* 04/17/2014 1259   BILIRUBINUR NEGATIVE 04/17/2014 1259   KETONESUR NEGATIVE 04/17/2014 1259   PROTEINUR >300* 04/17/2014 1259   UROBILINOGEN 1.0 04/17/2014 1259   NITRITE NEGATIVE 04/17/2014 1259   LEUKOCYTESUR LARGE* 04/17/2014 1259   Radiologic Imaging: Ct Abdomen Pelvis Wo Contrast  04/17/2014   CLINICAL DATA:  Code sepsis, acute renal injury, hydronephrosis  EXAM: CT ABDOMEN AND PELVIS WITHOUT CONTRAST  TECHNIQUE: Multidetector CT imaging of the abdomen and pelvis was performed following the standard protocol without IV contrast.  COMPARISON:  01/05/2013  FINDINGS: Lower chest: Images are degraded by patient motion. Probable minimal dependent bibasilar atelectasis is identified, suboptimally visualized.  Hepatobiliary: Extensive streak artifact due to patient's arms and motion. Unenhanced liver is normal in appearance. Gallbladder is normal.  Pancreas: Partly fatty infiltrated, normal.  Spleen: Normal  Adrenals/Urinary Tract: Left adrenal adenoma reidentified, suboptimally visualized due to motion. Bilateral radiopaque renal calculi are reidentified. There is severe right hydroureteronephrosis to the level of the 1.3 cm mid right ureteral calculus image 43. Ureters are otherwise decompressed without other radiopaque ureteral calculus identified. Trace  perinephric stranding. Bladder is decompressed and unremarkable.  Stomach/Bowel: No bowel wall thickening or focal segmental dilatation is identified. A few colonic tics are incidentally noted. Normal appendix. Bowel is contained within otherwise fat containing ventral abdominal wall hernia without evidence for obstruction or entrapment at this time. Moderate stool impaction within the rectum.  Vascular/Lymphatic: Moderate atheromatous aortic calcification without aneurysm. No lymphadenopathy.  Reproductive: Not applicable  Other: Fat containing umbilical hernia is identified. No free fluid or air.  Musculoskeletal: L5-S1 fusion hardware noted. No evidence for hardware failure. Severe multilevel disc degenerative changes identified with leftward curvature centered at L2. Evidence of previous posterior decompression L1-L2.  IMPRESSION: 1.3 cm mid right ureteral calculus producing severe right hydroureteronephrosis proximally.  Bilateral radiopaque renal calculi.  Bowel containing ventral abdominal wall hernia without evidence for obstruction.   Electronically Signed   By: Gretchen  Green M.D.   On: 04/17/2014 16:09   Dg Chest Port 1 View  04/17/2014   CLINICAL DATA:  Altered mental status.  EXAM: PORTABLE CHEST - 1 VIEW  COMPARISON:  Single view of the chest 01/06/2013  FINDINGS: Lung volumes are low with mild basilar atelectasis. Small pleural effusion is noted. Heart size is upper normal with vascular congestion identified. No pneumothorax.  IMPRESSION: Pulmonary vascular congestion without frank edema. Very small left effusion is noted.   Electronically Signed   By: Thomas  Dalessio M.D.   On: 04/17/2014 13:37    I independently reviewed the above imaging studies.  Impression/Recommendation 73M with significant systolic and diastolic heart failure, dementia, diabetes, history of DVT, and nephrolithiasis who presents with a 1.3 by 3 cm ureteral stone as well as sizable renal stones and concern for  infection. Needing urgent decompression. Cardiology evaluated him and feels he would be a reasonable candidate for both general anesthesia for stent or sedation for nephrostomy tube.  1. Right ureterolithiasis and nephrolithiasis with urinary tract infection and sepsis - 1.3 x 3 cm column of soft, low-HU stone resulting in severe right   hydronephrosis, also with UA positive for UTI (LE, blood, bacteria), mild leukocytosis.   Stent would likely be technically possible, however subsequent treatment of the stone would require at least 2, if not 3 procedures ureteroscopically with prolonged OR and anesthesia time.  Alternative would be a percutaneous nephrostomy tube with subsequent percutaneous nephrolithotomy which would more more likely to render him stone free with 1 procedure under shorter anesthesia time. After discussion with Dr. Eskridge, we feel this is the best option for both acute management of sepsis and subsequent stone management.  2. Acute kidney injury - likely due to obstruction from the stone as well as dehydration and sepsis     

## 2014-05-18 ENCOUNTER — Encounter (HOSPITAL_COMMUNITY): Payer: Self-pay | Admitting: Urology

## 2014-05-18 ENCOUNTER — Ambulatory Visit (HOSPITAL_COMMUNITY): Payer: Medicare Other

## 2014-05-18 ENCOUNTER — Observation Stay (HOSPITAL_COMMUNITY): Payer: Medicare Other

## 2014-05-18 DIAGNOSIS — Z888 Allergy status to other drugs, medicaments and biological substances status: Secondary | ICD-10-CM | POA: Diagnosis not present

## 2014-05-18 DIAGNOSIS — R627 Adult failure to thrive: Secondary | ICD-10-CM | POA: Diagnosis present

## 2014-05-18 DIAGNOSIS — Z79891 Long term (current) use of opiate analgesic: Secondary | ICD-10-CM | POA: Diagnosis not present

## 2014-05-18 DIAGNOSIS — E119 Type 2 diabetes mellitus without complications: Secondary | ICD-10-CM | POA: Diagnosis present

## 2014-05-18 DIAGNOSIS — F0390 Unspecified dementia without behavioral disturbance: Secondary | ICD-10-CM | POA: Diagnosis present

## 2014-05-18 DIAGNOSIS — Z87891 Personal history of nicotine dependence: Secondary | ICD-10-CM | POA: Diagnosis not present

## 2014-05-18 DIAGNOSIS — N179 Acute kidney failure, unspecified: Secondary | ICD-10-CM | POA: Diagnosis present

## 2014-05-18 DIAGNOSIS — Z87442 Personal history of urinary calculi: Secondary | ICD-10-CM | POA: Diagnosis not present

## 2014-05-18 DIAGNOSIS — I10 Essential (primary) hypertension: Secondary | ICD-10-CM | POA: Diagnosis present

## 2014-05-18 DIAGNOSIS — Z79899 Other long term (current) drug therapy: Secondary | ICD-10-CM | POA: Diagnosis not present

## 2014-05-18 DIAGNOSIS — R630 Anorexia: Secondary | ICD-10-CM | POA: Diagnosis present

## 2014-05-18 DIAGNOSIS — Z881 Allergy status to other antibiotic agents status: Secondary | ICD-10-CM | POA: Diagnosis not present

## 2014-05-18 DIAGNOSIS — N202 Calculus of kidney with calculus of ureter: Secondary | ICD-10-CM | POA: Diagnosis present

## 2014-05-18 DIAGNOSIS — Z86718 Personal history of other venous thrombosis and embolism: Secondary | ICD-10-CM | POA: Diagnosis not present

## 2014-05-18 DIAGNOSIS — I5042 Chronic combined systolic (congestive) and diastolic (congestive) heart failure: Secondary | ICD-10-CM | POA: Diagnosis present

## 2014-05-18 DIAGNOSIS — Z882 Allergy status to sulfonamides status: Secondary | ICD-10-CM | POA: Diagnosis not present

## 2014-05-18 DIAGNOSIS — E860 Dehydration: Secondary | ICD-10-CM | POA: Diagnosis present

## 2014-05-18 DIAGNOSIS — Z880 Allergy status to penicillin: Secondary | ICD-10-CM | POA: Diagnosis not present

## 2014-05-18 LAB — BASIC METABOLIC PANEL
ANION GAP: 11 (ref 5–15)
BUN: 32 mg/dL — ABNORMAL HIGH (ref 6–20)
CO2: 23 mmol/L (ref 22–32)
Calcium: 8.8 mg/dL — ABNORMAL LOW (ref 8.9–10.3)
Chloride: 105 mmol/L (ref 101–111)
Creatinine, Ser: 1.68 mg/dL — ABNORMAL HIGH (ref 0.61–1.24)
GFR calc Af Amer: 45 mL/min — ABNORMAL LOW (ref >60)
GFR calc non Af Amer: 38 mL/min — ABNORMAL LOW (ref >60)
Glucose, Bld: 291 mg/dL — ABNORMAL HIGH (ref 65–99)
Potassium: 4.4 mmol/L (ref 3.5–5.1)
SODIUM: 139 mmol/L (ref 135–145)

## 2014-05-18 LAB — HEMOGLOBIN AND HEMATOCRIT, BLOOD
HCT: 36.8 % — ABNORMAL LOW (ref 39.0–52.0)
HEMOGLOBIN: 11.6 g/dL — AB (ref 13.0–17.0)

## 2014-05-18 MED ORDER — HYDRALAZINE HCL 20 MG/ML IJ SOLN
10.0000 mg | INTRAMUSCULAR | Status: DC | PRN
  Administered 2014-05-18 (×2): 10 mg via INTRAVENOUS
  Filled 2014-05-18 (×2): qty 1

## 2014-05-18 MED ORDER — DEXTROSE 5 % IV SOLN
7.0000 mg/kg | Freq: Once | INTRAVENOUS | Status: DC
  Filled 2014-05-18: qty 17

## 2014-05-18 NOTE — Clinical Social Work Note (Addendum)
Clinical Social Work Assessment  Patient Details  Name: Henry Wyatt MRN: [id number] Date of Birth: October 02, 1940  Date of referral:  05/18/14               Reason for consult:  Facility Placement                Permission sought to share information with:  Facility Industrial/product designerContact Representative Permission granted to share information::  Yes, Verbal Permission Granted  Name::        Agency::     Relationship::     Contact Information:     Housing/Transportation Living arrangements for the past 2 months:  Skilled Nursing Facility Source of Information:  Patient, Spouse Patient Interpreter Needed:  None Criminal Activity/Legal Involvement Pertinent to Current Situation/Hospitalization:    Significant Relationships:  Spouse Lives with:  Facility Resident Do you feel safe going back to the place where you live?  Yes Need for family participation in patient care:  Yes (Comment)  Care giving concerns:  CSW received consult that patient was [facility].    Social Worker assessment / plan:  CSW confirmed with patient & wife, Henry Wyatt that plan is for patient to [facility] at discharge. CSW confirmed with Henry Wyatt at [facility] that they would be able to take patient back when stable for discharge.   Employment status:  Retired Engineer, miningnsurance information:  Medicare PT Recommendations:  Skilled [facility]ursing Facility, Not assessed at this time Information / Referral to community resources:  Skilled Nursing Facility  Patient/Family's Response to care:  Patient is anxious to get back home, though per RN he has been having nausea/vomiting so until that is stable, he will stay here in hospital.   Patient/Family's Understanding of and Emotional Response to Diagnosis, Current Treatment, and Prognosis:  Patient seemed confused when CSW spoke with patient, he was calling out to anyone who walked by his room and when CSW entered patient seemed like he was trying to collect his thoughts  and then said "this chair shouldn't be in here". Per RN patient has been confused.   Emotional Assessment Appearance:  Appears stated age Attitude/Demeanor/Rapport:    Affect (typically observed):  Anxious, Angry Orientation:  Oriented to Self, Oriented to  Time Alcohol / Substance use:    Psych involvement (Current and /or in the community):  No (Comment)  Discharge Needs  Concerns to be addressed:    Readmission within the last 30 days:    Current discharge risk:    Barriers to Discharge:      Henry Wyatt, Henry Wyatt F, LCSW 05/18/2014, 1:25 PM

## 2014-05-18 NOTE — Progress Notes (Signed)
ANTIBIOTIC CONSULT NOTE - INITIAL  Pharmacy Consult for Gentamicin Indication: Urinary infection  Allergies  Allergen Reactions  . Carbapenems Other (See Comments)    Per nursing home [facility]  . Cephalosporins Other (See Comments)    Per nursing home [facility]  . Erythromycin Other (See Comments)    Per nursing home [facility]  . Penicillins Other (See Comments)    Per nursing home [facility]  . Sulfa Antibiotics Other (See Comments)    Per nursing home [facility]    Patient Measurements: Height: 6\' 1"  (185.4 cm) Weight: 274 lb 8 oz (124.512 kg) IBW/kg (Calculated) : 79.9 Adjusted Body Weight: 97.7 kg  Vital Signs: Temp: 97.9 F (36.6 C) (05/12 1540) Temp Source: Oral (05/12 1540) BP: 166/99 mmHg (05/12 1540) Pulse Rate: 80 (05/12 1540) Intake/Output from previous day: 05/11 0701 - 05/12 0700 In: 2943.8 [P.O.:800; I.V.:2143.8] Out: 2990 [Urine:2990] Intake/Output from this shift: Total I/O In: -  Out: 750 [Urine:750]  Labs:  Recent Labs  05/17/14 0823 05/18/14 0634  WBC 11.8*  --   HGB 12.2* 11.6*  PLT 230  --   CREATININE 1.83* 1.68*   Estimated Creatinine Clearance: 53.3 mL/min (by C-G formula based on Cr of 1.68). No results for input(s): VANCOTROUGH, VANCOPEAK, VANCORANDOM, GENTTROUGH, GENTPEAK, GENTRANDOM, TOBRATROUGH, TOBRAPEAK, TOBRARND, AMIKACINPEAK, AMIKACINTROU, AMIKACIN in the last 72 hours.   Microbiology: No results found for this or any previous visit (from the past 720 hour(s)).  Medical History: Past Medical History  Diagnosis Date  . Hypertension   . Diabetes mellitus without complication   . Constipation   . DVT (deep venous thrombosis)   . Back pain   . Septic shock     01/05/13- admitted to hospital   . Diarrhea     admitted to hospital on 01/05/13   . Pneumonia     health care associated- 01/05/13   . Altered mental status     01/05/13   . C. difficile diarrhea     01/05/13- admitted to hospital   . Influenza A     01/05/13 admitted to hospital   .  Hypokalemia   . Dysphagia   . 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 Dementia   . Kidney injury   . Kidney stone     01/05/13   . Hydronephrosis     left kidney 01/05/13   . Acute kidney injury     01/05/13   . Acute pyelonephritis     01/05/13     Medications:  Anti-infectives    Start     Dose/Rate Route Frequency Ordered Stop   05/18/14 1000  gentamicin (GARAMYCIN) 680 mg in dextrose 5 % 100 mL IVPB     7 mg/kg  97.7 kg (Adjusted) 117 mL/hr over 60 Minutes Intravenous  Once 05/17/14 1523 05/18/14 1139   05/17/14 0600  gentamicin (GARAMYCIN) 440 mg in dextrose 5 % 100 mL IVPB     440 mg 222 mL/hr over 30 Minutes Intravenous 30 min pre-op 05/16/14 1341 05/17/14 0930     Assessment: 73 yoM admitted 5/11 s/p perc nephrolithotomy.  PMH includes AMS with urosepsis from left ureteral stone requiring emergency stent and laser lithotripsy 01/2013.  He more recently had Proteus pyelonephritis complicated by obstructing nephrolithiasis 04/2014 and was on Aztreonam IV from 04/19/14 - 05/03/14.  He received gentamicin pre-operative antibiotic.  Pharmacy is now consulted to dose gentamicin for possible UTI.  Note reported allergies (unknown  reaction) to Carbapenems, Cephs, Erythromycin, PCN, and Sulfa.  5/11 >> Gentamicin >>    Today, 05/18/2014:  Tmax: afebrile  WBC: 11.8 (5/11)  Renal: SCr improved to 1.68, CrCl ~ 53 ml/min CG, ~ 39 ml/min N.  Goal of Therapy:  Gentamicin trough level <2 mcg/ml  Plan:   Gentamicin 680mg  IV x1 dose, next dose 5/13 at 1000  Check Gentamicin trough level as needed.  Follow up renal fxn, culture results, and clinical course.  Lynann Beaverhristine Oreta Soloway PharmD, BCPS Pager [phone] 05/18/2014 3:55 PM

## 2014-05-18 NOTE — Progress Notes (Signed)
Patient had several episodes of emesis this morning, BP elevated at 207/91. Daily Metoprolol give early for elevated BP.

## 2014-05-18 NOTE — Progress Notes (Signed)
RUA PICC d/c'ed per order.  Site cleaned with CHG, covered with vaseline guaze and dry 4x4.  No ADN.  Pt confused.

## 2014-05-18 NOTE — Care Management Note (Signed)
Case Management Note  Patient Details  Name: Henry SproutWilliam Wyatt MRN: [id number] Date of Birth: 1940/10/26  Subjective/Objective:     74 y/o m admitted w/Nephrolithiasis.               Action/Plan:From [facility].   Expected Discharge Date:                  Expected Discharge Plan:  Skilled Nursing Facility  In-House Referral:  Clinical Social Work  Discharge planning Services  CM Consult  Post Acute Care Choice:    Choice offered to:     DME Arranged:    DME Agency:     HH Arranged:    HH Agency:     Status of Service:  In process, will continue to follow  Medicare Important Message Given:    Date Medicare IM Given:    Medicare IM give by:    Date Additional Medicare IM Given:    Additional Medicare Important Message give by:     If discussed at Long Length of Stay Meetings, dates discussed:    Additional Comments:  Lanier ClamMahabir, Idolina Mantell, RN 05/18/2014, 12:24 PM

## 2014-05-19 LAB — GLUCOSE, CAPILLARY: Glucose-Capillary: 149 mg/dL — ABNORMAL HIGH (ref 65–99)

## 2014-05-19 LAB — BASIC METABOLIC PANEL
Anion gap: 11 (ref 5–15)
BUN: 28 mg/dL — ABNORMAL HIGH (ref 6–20)
CALCIUM: 8.4 mg/dL — AB (ref 8.9–10.3)
CO2: 23 mmol/L (ref 22–32)
Chloride: 104 mmol/L (ref 101–111)
Creatinine, Ser: 1.71 mg/dL — ABNORMAL HIGH (ref 0.61–1.24)
GFR calc Af Amer: 44 mL/min — ABNORMAL LOW (ref >60)
GFR calc non Af Amer: 38 mL/min — ABNORMAL LOW (ref >60)
Glucose, Bld: 174 mg/dL — ABNORMAL HIGH (ref 65–99)
POTASSIUM: 4.6 mmol/L (ref 3.5–5.1)
Sodium: 138 mmol/L (ref 135–145)

## 2014-05-19 MED ORDER — ONDANSETRON HCL 4 MG PO TABS: 4.0000 mg | ORAL_TABLET | Freq: Three times a day (TID) | ORAL | Status: AC | PRN

## 2014-05-19 NOTE — Progress Notes (Signed)
Postop day 1 Subjective: Patient reports  no significant discomfort. He has had ongoing nausea. He had multiple episodes of emesis this morning including some bilious emesis. Objective: Vital signs in last 24 hours: Temp:  [97.8 F (36.6 C)-98.2 F (36.8 C)] 97.8 F (36.6 C) (05/13 0548) Pulse Rate:  [72-80] 75 (05/13 0548) Resp:  [16-18] 18 (05/13 0548) BP: (157-175)/(87-99) 157/99 mmHg (05/13 0548) SpO2:  [95 %-98 %] 95 % (05/13 0548) Weight:  [105.416 kg (232 lb 6.4 oz)-105.96 kg (233 lb 9.6 oz)] 105.416 kg (232 lb 6.4 oz) (05/13 0626)  Intake/Output from previous day: 05/12 0701 - 05/13 0700 In: 1161.3 [P.O.:240; I.V.:921.3] Out: 1675 [Urine:1675] Intake/Output this shift:    Physical Exam:  Constitutional: Vital signs reviewed. WD WN in NAD   Eyes: PERRL, No scleral icterus.   Cardiovascular: RRR Pulmonary/Chest: Normal effort Abdominal: Soft. Non-tender, non-distended, bowel sounds are normal, no masses, organomegaly, or guarding present.  Genitourinary: Nephrostomy tube draining light pink urine. Foley draining light pink urine Extremities: No cyanosis or edema   Lab Results:  Recent Labs  05/17/14 0823 05/18/14 0634  HGB 12.2* 11.6*  HCT 38.1* 36.8*   BMET  Recent Labs  05/17/14 0823 05/18/14 0634  NA 136 139  K 4.1 4.4  CL 107 105  CO2 22 23  GLUCOSE 167* 291*  BUN 40* 32*  CREATININE 1.83* 1.68*  CALCIUM 8.9 8.8*   No results for input(s): LABPT, INR in the last 72 hours. No results for input(s): LABURIN in the last 72 hours. Results for orders placed or performed during the hospital encounter of 04/17/14  Urine culture     Status: None   Collection Time: 04/17/14 12:59 PM  Result Value Ref Range Status   Specimen Description URINE, CATHETERIZED  Final   Special Requests NONE  Final   Colony Count   Final    50,000 COLONIES/ML Performed at [facility]    Culture   Final    PROTEUS MIRABILIS Performed at [facility]     Report Status 04/19/2014 FINAL  Final   Organism ID, Bacteria PROTEUS MIRABILIS  Final      Susceptibility   Proteus mirabilis - MIC*    AMPICILLIN 16 INTERMEDIATE Intermediate     CEFAZOLIN <=4 SENSITIVE Sensitive     CEFTRIAXONE <=1 SENSITIVE Sensitive     CIPROFLOXACIN >=4 RESISTANT Resistant     GENTAMICIN <=1 SENSITIVE Sensitive     LEVOFLOXACIN >=8 RESISTANT Resistant     NITROFURANTOIN RESISTANT      TOBRAMYCIN <=1 SENSITIVE Sensitive     TRIMETH/SULFA >=320 RESISTANT Resistant     PIP/TAZO <=4 SENSITIVE Sensitive     * PROTEUS MIRABILIS  Blood Culture (routine x 2)     Status: None   Collection Time: 04/17/14  1:15 PM  Result Value Ref Range Status   Specimen Description BLOOD LEFT ARM  Final   Special Requests BOTTLES DRAWN AEROBIC AND ANAEROBIC 5CC  Final   Culture   Final    NO GROWTH 5 DAYS Performed at [facility]    Report Status 04/23/2014 FINAL  Final  Blood Culture (routine x 2)     Status: None   Collection Time: 04/17/14  2:44 PM  Result Value Ref Range Status   Specimen Description BLOOD BLOOD RIGHT FOREARM  Final   Special Requests BOTTLES DRAWN AEROBIC AND ANAEROBIC  Final   Culture   Final    NO GROWTH 5 DAYS Performed at  [facility]    Report Status 04/23/2014 FINAL  Final  MRSA PCR Screening     Status: None   Collection Time: 04/17/14  4:45 PM  Result Value Ref Range Status   MRSA by PCR NEGATIVE NEGATIVE Final    Comment:        The GeneXpert MRSA Assay (FDA approved for NASAL specimens only), is one component of a comprehensive MRSA colonization surveillance program. It is not intended to diagnose MRSA infection nor to guide or monitor treatment for MRSA infections.   Urine culture     Status: None   Collection Time: 04/17/14  9:15 PM  Result Value Ref Range Status   Specimen Description URINE, RANDOM  Final   Special Requests URINE FROM RIGHT KIDNEY  Final   Colony Count   Final    >=100,000  COLONIES/ML Performed at [facility]    Culture   Final    PROTEUS MIRABILIS Performed at [facility]    Report Status 04/20/2014 FINAL  Final   Organism ID, Bacteria PROTEUS MIRABILIS  Final      Susceptibility   Proteus mirabilis - MIC*    AMPICILLIN >=32 RESISTANT Resistant     CEFAZOLIN <=4 SENSITIVE Sensitive     CEFTRIAXONE <=1 SENSITIVE Sensitive     CIPROFLOXACIN >=4 RESISTANT Resistant     GENTAMICIN <=1 SENSITIVE Sensitive     LEVOFLOXACIN >=8 RESISTANT Resistant     NITROFURANTOIN RESISTANT      TOBRAMYCIN <=1 SENSITIVE Sensitive     TRIMETH/SULFA >=320 RESISTANT Resistant     PIP/TAZO <=4 SENSITIVE Sensitive     * PROTEUS MIRABILIS    Studies/Results: Ct Abdomen Pelvis Wo Contrast  05/18/2014   CLINICAL DATA:  Nephrolithiasis.  Kidney stone retrieval.  EXAM: CT ABDOMEN AND PELVIS WITHOUT CONTRAST  TECHNIQUE: Multidetector CT imaging of the abdomen and pelvis was performed following the standard protocol without IV contrast.  COMPARISON:  04/17/2014  FINDINGS: Mild motion degradation throughout.  Lower chest: Bibasilar atelectasis. Mild cardiomegaly. Mild bilateral gynecomastia.  Hepatobiliary: Normal liver. Normal gallbladder, without biliary ductal dilatation.  Pancreas: Normal, without mass or ductal dilatation.  Spleen: Normal  Adrenals/Urinary Tract: 2.8 cm low-density left adrenal nodule is most consistent with an adenoma. Normal right adrenal gland.  Bilateral renal cortical thinning. Punctate left renal collecting system calculi, without hydronephrosis or hydroureter.  A right-sided nephrostomy sheath which terminates in the proximal right ureter. Small bore ureteric catheter extends into the right-sided urinary bladder. Air within the right renal collecting system which is likely secondary. Residual right renal collecting system stone measures 5 mm.  The dominant right ureteric stone is no longer identified. Increased density along the tract of the  nephrostomy sheath. 8 mm on transverse image 37 and coronal image 64. Improvement to resolution of right sided hydronephrosis. Mild caliectasis remains. No distal ureteric or bladder calculi. Foley catheter within the urinary bladder.  Stomach/Bowel: Normal stomach, without wall thickening. 9.2 cm stool ball within the rectum. Normal terminal ileum. Small bowel again positioned within ventral pelvic laxity.  Vascular/Lymphatic: Aortic and branch vessel atherosclerosis. Small retroperitoneal nodes are not pathologic by size criteria and likely reactive. No pelvic adenopathy.  Reproductive: Normal prostate.  Other: No significant free fluid.  Ventral abdominal wall laxity.  Musculoskeletal: Lumbosacral spine fixation. Spinal curvature is convex left.  IMPRESSION: 1. Mildly motion degraded exam. 2. Placement of right sided nephrostomy catheter and small bore ureteric catheter. Improvement in appearance of the right kidney with  removal of the right ureteric stone. Increased density along the nephrostomy catheter (in the right ureter) could represent small volume residual stone or stone fragments versus contrast. 3. Bilateral nephrolithiasis. 4. Rectal stool burden suggests constipation or even mild fecal impaction. 5. Bilateral gynecomastia. 6. Left adrenal adenoma.   Electronically Signed   By: Jeronimo Greaves M.D.   On: 05/18/2014 08:56   Ir Dil Ureter Right  05/17/2014   CLINICAL DATA:  74 year old male with obstructed pyonephrosis in the setting of nephrolithiasis. Percutaneous nephrostomy tube was placed on 04/17/2014. Patient now set to undergo percutaneous nephrolithotomy. Dr. Archer Asa assisting Dr. Isabel Caprice.  EXAM: DIL URETER W/O STENT*R*; DG C-ARM 1-60 MIN - NRPT [facility]  Date: 05/17/2014  PROCEDURE: 1. Catheterization of the ureter and bladder from the existing percutaneous nephrostomy access 2. Balloon dilatation of percutaneous tract into the collecting system followed by placement of a large-bore sheath.   ANESTHESIA/SEDATION: General anesthesia provided by the anesthesiology service.  MEDICATIONS: Gentamicin administered intravenously.  FLUOROSCOPY TIME:  Please see operative report for further detail  CONTRAST:  10 mL contrast administered into the collecting system  TECHNIQUE: Informed consent was obtained from the patient following explanation of the procedure, risks, benefits and alternatives. The patient understands, agrees and consents for the procedure. All questions were addressed. A time out was performed.  Maximal barrier sterile technique utilized including caps, mask, sterile gowns, sterile gloves, large sterile drape, hand hygiene, and Betadine skin prep.  The existing retention suture was cut. The percutaneous nephrostomy tube hub was transected and a wire advanced into the renal pelvis. The tube was removed over the wire. A 5 [place] Kumpe the catheter was then introduced into the renal pelvis. A hand injection of contrast material demonstrates no evidence of hydronephrosis. There is a large filling defects centrally within the renal pelvis. The Kumpe the catheter was then navigated into the bladder. An Amplatz wire was coiled within the bladder in the Kumpe the catheter removed.  A peel-away sheath was then advanced over the wire and into the mid ureter. A second safety Amplatz wire was then advanced into the bladder. The peel-away sheath was removed.  A 7 mm balloon dilatation catheter with an accompanying large-bore sheath was then introduced over the wire and positioned in the subcutaneous tract renal parenchyma. The balloon was dilated and the large-bore sheath advanced over the balloon and into the renal collecting system.  I then state in the operating room into will we were are short that the percutaneous renal access was satisfactory. Percutaneous nephrolithotomy was then carried out by Dr. Isabel Caprice.  COMPLICATIONS: None  IMPRESSION: Ureteral and bladder catheterization via existing percutaneous  nephrostomy tube followed by tract dilatation and large-bore sheath placement.  Percutaneous nephrolithotomy then performed by Dr. Isabel Caprice.  Signed,  Sterling Big, MD  [facility]  [facility]   Electronically Signed   By: Malachy Moan M.D.   On: 05/17/2014 18:00   Dg C-arm 1-60 Min-no Report  05/17/2014   CLINICAL DATA: surgery   C-ARM 1-60 MINUTES  Fluoroscopy was utilized by the requesting physician.  No radiographic  interpretation.     Assessment/Plan:   Apparent successful nephrolithotomy. The patient has had significant ongoing nausea. We will assess the right kidney for presence of obstruction and/or urinoma. Patient will need BASIC metabolic study assessed today include a sodium level has potential causes of severe nausea. Mental status is pretty much at baseline based on my previous encounters with Mr. Rosiak. He  is afebrile. He is significantly hypertensive and we've written for some when necessary antihypertensive medicine to go along with his regularly scheduled antihypertensives. We will assess CT scan and then determine what we need to do with nephrostomy tube.   LOS: 1 day   Kyan Giannone S 05/19/2014, 7:46 AM

## 2014-05-19 NOTE — Progress Notes (Signed)
2 Days Post-Op Subjective: Patient reports  Ongoing nausea. He denies pain.  Foley catheter was removed and he has been voiding.  He's had a moderate amount of drainage out of his nephrostomy tube site status post removal of the nephrostomy tube.  He still has a ureteral catheter that goes down to the bladder. The etiology for his ongoing nausea and emesis is unclear.  CT did not reveal any evidence of obstruction or urinoma.  Metabolic studies were normal.  He may have some gastroparesis secondary to his diabetes mellitus.  If he continues to have failure to thrive will ask hospitalist service to consult. If he is better in the a.m. Hopefully he can transfer back to his nursing home.  Objective: Vital signs in last 24 hours: Temp:  [97.8 F (36.6 C)-98.2 F (36.8 C)] 98 F (36.7 C) (05/13 1430) Pulse Rate:  [72-80] 80 (05/13 1430) Resp:  [18] 18 (05/13 1430) BP: (154-175)/(80-99) 154/80 mmHg (05/13 1430) SpO2:  [95 %-99 %] 99 % (05/13 1430) Weight:  [105.416 kg (232 lb 6.4 oz)-105.96 kg (233 lb 9.6 oz)] 105.416 kg (232 lb 6.4 oz) (05/13 0626)  Intake/Output from previous day: 05/12 0701 - 05/13 0700 In: 1161.3 [P.O.:240; I.V.:921.3] Out: 1675 [Urine:1675] Intake/Output this shift: Total I/O In: 360 [P.O.:360] Out: -   Physical Exam:  Constitutional: Vital signs reviewed. WD WN in NAD   Eyes: PERRL, No scleral icterus.   Cardiovascular: RRR Pulmonary/Chest: Normal effort Abdominal: Soft. Non-tender, non-distended, bowel sounds are normal, no masses, organomegaly, or guarding present.  Genitourinary:nephrostomy tube site draining moderate amount of urine.  Confee catheter   In place Extremities: No cyanosis or edema   Lab Results:  Recent Labs  05/17/14 0823 05/18/14 0634  HGB 12.2* 11.6*  HCT 38.1* 36.8*   BMET  Recent Labs  05/18/14 0634 05/19/14 0748  NA 139 138  K 4.4 4.6  CL 105 104  CO2 23 23  GLUCOSE 291* 174*  BUN 32* 28*  CREATININE 1.68* 1.71*   CALCIUM 8.8* 8.4*   No results for input(s): LABPT, INR in the last 72 hours. No results for input(s): LABURIN in the last 72 hours. Results for orders placed or performed during the hospital encounter of 04/17/14  Urine culture     Status: None   Collection Time: 04/17/14 12:59 PM  Result Value Ref Range Status   Specimen Description URINE, CATHETERIZED  Final   Special Requests NONE  Final   Colony Count   Final    50,000 COLONIES/ML Performed at [facility]    Culture   Final    PROTEUS MIRABILIS Performed at [facility]    Report Status 04/19/2014 FINAL  Final   Organism ID, Bacteria PROTEUS MIRABILIS  Final      Susceptibility   Proteus mirabilis - MIC*    AMPICILLIN 16 INTERMEDIATE Intermediate     CEFAZOLIN <=4 SENSITIVE Sensitive     CEFTRIAXONE <=1 SENSITIVE Sensitive     CIPROFLOXACIN >=4 RESISTANT Resistant     GENTAMICIN <=1 SENSITIVE Sensitive     LEVOFLOXACIN >=8 RESISTANT Resistant     NITROFURANTOIN RESISTANT      TOBRAMYCIN <=1 SENSITIVE Sensitive     TRIMETH/SULFA >=320 RESISTANT Resistant     PIP/TAZO <=4 SENSITIVE Sensitive     * PROTEUS MIRABILIS  Blood Culture (routine x 2)     Status: None   Collection Time: 04/17/14  1:15 PM  Result Value Ref Range Status   Specimen Description  BLOOD LEFT ARM  Final   Special Requests BOTTLES DRAWN AEROBIC AND ANAEROBIC 5CC  Final   Culture   Final    NO GROWTH 5 DAYS Performed at [facility]    Report Status 04/23/2014 FINAL  Final  Blood Culture (routine x 2)     Status: None   Collection Time: 04/17/14  2:44 PM  Result Value Ref Range Status   Specimen Description BLOOD BLOOD RIGHT FOREARM  Final   Special Requests BOTTLES DRAWN AEROBIC AND ANAEROBIC 10MLS  Final   Culture   Final    NO GROWTH 5 DAYS Performed at [facility]    Report Status 04/23/2014 FINAL  Final  MRSA PCR Screening     Status: None   Collection Time: 04/17/14  4:45 PM  Result Value Ref Range  Status   MRSA by PCR NEGATIVE NEGATIVE Final    Comment:        The GeneXpert MRSA Assay (FDA approved for NASAL specimens only), is one component of a comprehensive MRSA colonization surveillance program. It is not intended to diagnose MRSA infection nor to guide or monitor treatment for MRSA infections.   Urine culture     Status: None   Collection Time: 04/17/14  9:15 PM  Result Value Ref Range Status   Specimen Description URINE, RANDOM  Final   Special Requests URINE FROM RIGHT KIDNEY  Final   Colony Count   Final    >=100,000 COLONIES/ML Performed at [facility]    Culture   Final    PROTEUS MIRABILIS Performed at [facility]    Report Status 04/20/2014 FINAL  Final   Organism ID, Bacteria PROTEUS MIRABILIS  Final      Susceptibility   Proteus mirabilis - MIC*    AMPICILLIN >=32 RESISTANT Resistant     CEFAZOLIN <=4 SENSITIVE Sensitive     CEFTRIAXONE <=1 SENSITIVE Sensitive     CIPROFLOXACIN >=4 RESISTANT Resistant     GENTAMICIN <=1 SENSITIVE Sensitive     LEVOFLOXACIN >=8 RESISTANT Resistant     NITROFURANTOIN RESISTANT      TOBRAMYCIN <=1 SENSITIVE Sensitive     TRIMETH/SULFA >=320 RESISTANT Resistant     PIP/TAZO <=4 SENSITIVE Sensitive     * PROTEUS MIRABILIS    Studies/Results: Ct Abdomen Pelvis Wo Contrast  05/18/2014   CLINICAL DATA:  Nephrolithiasis.  Kidney stone retrieval.  EXAM: CT ABDOMEN AND PELVIS WITHOUT CONTRAST  TECHNIQUE: Multidetector CT imaging of the abdomen and pelvis was performed following the standard protocol without IV contrast.  COMPARISON:  04/17/2014  FINDINGS: Mild motion degradation throughout.  Lower chest: Bibasilar atelectasis. Mild cardiomegaly. Mild bilateral gynecomastia.  Hepatobiliary: Normal liver. Normal gallbladder, without biliary ductal dilatation.  Pancreas: Normal, without mass or ductal dilatation.  Spleen: Normal  Adrenals/Urinary Tract: 2.8 cm low-density left adrenal nodule is most consistent  with an adenoma. Normal right adrenal gland.  Bilateral renal cortical thinning. Punctate left renal collecting system calculi, without hydronephrosis or hydroureter.  A right-sided nephrostomy sheath which terminates in the proximal right ureter. Small bore ureteric catheter extends into the right-sided urinary bladder. Air within the right renal collecting system which is likely secondary. Residual right renal collecting system stone measures 5 mm.  The dominant right ureteric stone is no longer identified. Increased density along the tract of the nephrostomy sheath. 8 mm on transverse image 37 and coronal image 64. Improvement to resolution of right sided hydronephrosis. Mild caliectasis remains. No distal ureteric or  bladder calculi. Foley catheter within the urinary bladder.  Stomach/Bowel: Normal stomach, without wall thickening. 9.2 cm stool ball within the rectum. Normal terminal ileum. Small bowel again positioned within ventral pelvic laxity.  Vascular/Lymphatic: Aortic and branch vessel atherosclerosis. Small retroperitoneal nodes are not pathologic by size criteria and likely reactive. No pelvic adenopathy.  Reproductive: Normal prostate.  Other: No significant free fluid.  Ventral abdominal wall laxity.  Musculoskeletal: Lumbosacral spine fixation. Spinal curvature is convex left.  IMPRESSION: 1. Mildly motion degraded exam. 2. Placement of right sided nephrostomy catheter and small bore ureteric catheter. Improvement in appearance of the right kidney with removal of the right ureteric stone. Increased density along the nephrostomy catheter (in the right ureter) could represent small volume residual stone or stone fragments versus contrast. 3. Bilateral nephrolithiasis. 4. Rectal stool burden suggests constipation or even mild fecal impaction. 5. Bilateral gynecomastia. 6. Left adrenal adenoma.   Electronically Signed   By: Jeronimo Greaves M.D.   On: 05/18/2014 08:56    Assessment/Plan:    successful  percutaneous nephrolithotomy.  No obvious complications on CT imaging.  Only issue is been some failure to thrive with poor appetite and ongoing nausea with intermittent emesis.  No obvious etiology established.  If better in the morning will have ureteral catheter removed and hopefully transfer to his nursing home facility.  If he persists in having failure to thrive we'll see if hospitalist service can provide consultation.   LOS: 1 day   Vaunda Gutterman S 05/19/2014, 4:31 PM

## 2014-05-19 NOTE — Discharge Instructions (Signed)
DISCHARGE INSTRUCTIONS FOR [facility]  MEDICATIONS:  1. DO NOT RESUME YOUR IBUPROFEN, or any other medicines like aspirin, motrin, excedrin, advil, aleve, vitamin E, fish oil as these can all cause bleeding x 10 days.  2. Resume all your other meds from home - except do not take any other pain meds that you may have at home. ACTIVITY 1. No strenuous activity, sexual activity, or lifting greater than 10 pounds for 3 weeks. 2. No driving while on narcotic pain medications 3. Drink plenty of water 4. Continue to walk at home - you can still get blood clots when you are at home, so keep active, but don't over do it. 5. May return to work in 1 week (but not heavy or strenuous activity).  BATHING 1. You can shower and we recommend daily showers.  Cover your wound with a dressing and remove the dressing immediately after the shower.  Do not submerge wound under water.  WOUND CARE Your wound will drain bloody fluid and may do so for 7-14 days. You have 2 options for dressings:  1. You may use kerlex (rolled up gauze) and tape to dress your wound.  If you choose this method, then change the dressing as it becomes soaked.  Change it at least once daily until it stops draining. 2. You may use and ostomy device.  This is a bag with an andhesive circle.  The circle has a hole in the middle of it and you cut the hole to the size needed to fit the wound.  This will collect the drainage in the bag and allow you to drain the bag as needed.  SIGNS/SYMPTOMS TO CALL: 1. Please call us if you have a fever greater than 101.5, uncontrolled nausea/vomiting, uncontrolled pain, dizziness, unable to urinate, bloody urine, chest pain, shortness of breath, leg swelling, leg pain, redness around wound, drainage from wound, or any other concerns or questions. 2. You can reach us at [phone]. FOLLOW-UP 1.   We will call the nursing facility to arrange follow-up.  No medication changes Adding Zofran for nausea when necessary

## 2014-05-20 MED ORDER — TRAMADOL-ACETAMINOPHEN 37.5-325 MG PO TABS: 1.0000 | ORAL_TABLET | Freq: Four times a day (QID) | ORAL | Status: AC | PRN

## 2014-05-20 NOTE — Clinical Social Work Note (Addendum)
CSW received a call from RN stating that pt was ready for discharge  CSW called and [facility] from to provide discharge paperwork  CSW left message for MD to obtain script for Oxycodone medication.  MD changed oxycodone medication and replaced it with new medication  CSW re-faxed d/c summary to SNF with corrected medication  CSW prepared discharge packet, provided it to RN and called [facility]  CSW attempted to call pt wife but mailbox was full  No further CSW needs  CSW signing off  .Elray Bubaegina Abbegayle Denault, LCSW [facility] Clinical Social Worker - Weekend Coverage cell #: [phone]

## 2014-05-20 NOTE — Progress Notes (Signed)
Discharge [facility], spoke to [employer] RN, , Patients own wheelchair  Will be picked [facility] by [facility]. PIV removed by NT, no s/s of infiltration or swelling noted.

## 2014-05-20 NOTE — Progress Notes (Signed)
Urology Progress Note  3 Days Post-Op   Subjective: Norco changed to Ultracet    No acute urologic events overnight. Ambulation:   negative Flatus:    negative Bowel movement  negative  Pain: some relief  Objective:  Blood pressure 159/63, pulse 64, temperature 98.5 F (36.9 C), temperature source Oral, resp. rate 20, height 6\' 1"  (1.854 m), weight 104.2 kg (229 lb 11.5 oz), SpO2 95 %.  Physical Exam:  General:  No acute distress, awake Extremities: extremities normal, atraumatic, no cyanosis or edema Genitourinary:   Foley:    I/O last 3 completed shifts: In: 1200.7 [P.O.:840; I.V.:360.7] Out: 925 [Urine:925]  Recent Labs     05/18/14  0634  HGB  11.6*    Recent Labs     05/18/14  0634  05/19/14  0748  NA  139  138  K  4.4  4.6  CL  105  104  CO2  23  23  BUN  32*  28*  CREATININE  1.68*  1.71*  CALCIUM  8.8*  8.4*  GFRNONAA  38*  38*  GFRAA  45*  44*     No results for input(s): INR, APTT in the last 72 hours.  Invalid input(s): PT   Invalid input(s): ABG  Assessment/Plan:  Follow up per Dr/ Isabel CapriceGrapey.

## 2014-05-20 NOTE — Discharge Summary (Signed)
Patient ID: Henry SproutWilliam Paddock MRN: [id number] DOB/AGE: November 05, 1940 74 y.o.  Admit date: 05/17/2014 Discharge date: 05/20/2014    Discharge Diagnoses:   Present on Admission:  **None**  Consults:  None    Discharge Medications:   Medication List    STOP taking these medications        aztreonam 1 g in dextrose 5 % 50 mL      TAKE these medications        acetaminophen 500 MG tablet  Commonly known as:  TYLENOL  Take 1,000 mg by mouth every 8 (eight) hours as needed for mild pain.     bisacodyl 5 MG EC tablet  Commonly known as:  DULCOLAX  Take 5-10 mg by mouth daily as needed for moderate constipation.     carbidopa-levodopa 10-100 MG per tablet  Commonly known as:  SINEMET IR  Take 1 tablet by mouth 3 (three) times daily.     feeding supplement (ENSURE ENLIVE) Liqd  Take 237 mLs by mouth 2 (two) times daily between meals.     HYDROcodone-acetaminophen 5-325 MG per tablet  Commonly known as:  NORCO/VICODIN  Take 1-2 tablets by mouth every 6 (six) hours as needed for moderate pain.     LEVEMIR 100 UNIT/ML injection  Generic drug:  insulin detemir  Inject 4 Units into the skin at bedtime.     memantine 28 MG Cp24 24 hr capsule  Commonly known as:  NAMENDA XR  Take 28 mg by mouth daily with breakfast.     metoprolol succinate 50 MG 24 hr tablet  Commonly known as:  TOPROL-XL  Take 1 tablet (50 mg total) by mouth daily.     mineral oil liquid  Place 0.1 mLs into both ears daily.     nitroGLYCERIN 0.4 MG SL tablet  Commonly known as:  NITROSTAT  Place 0.4 mg under the tongue every 5 (five) minutes as needed for chest pain.     ondansetron 4 MG tablet  Commonly known as:  ZOFRAN  Take 1 tablet (4 mg total) by mouth every 8 (eight) hours as needed for nausea or vomiting.     rOPINIRole 0.5 MG tablet  Commonly known as:  REQUIP  Take 0.5 mg by mouth 3 (three) times daily.     senna 8.6 MG Tabs tablet  Commonly known as:  SENOKOT  Take 1 tablet by mouth at  bedtime.         Significant Diagnostic Studies:  Ct Abdomen Pelvis Wo Contrast  05/18/2014   CLINICAL DATA:  Nephrolithiasis.  Kidney stone retrieval.  EXAM: CT ABDOMEN AND PELVIS WITHOUT CONTRAST  TECHNIQUE: Multidetector CT imaging of the abdomen and pelvis was performed following the standard protocol without IV contrast.  COMPARISON:  04/17/2014  FINDINGS: Mild motion degradation throughout.  Lower chest: Bibasilar atelectasis. Mild cardiomegaly. Mild bilateral gynecomastia.  Hepatobiliary: Normal liver. Normal gallbladder, without biliary ductal dilatation.  Pancreas: Normal, without mass or ductal dilatation.  Spleen: Normal  Adrenals/Urinary Tract: 2.8 cm low-density left adrenal nodule is most consistent with an adenoma. Normal right adrenal gland.  Bilateral renal cortical thinning. Punctate left renal collecting system calculi, without hydronephrosis or hydroureter.  A right-sided nephrostomy sheath which terminates in the proximal right ureter. Small bore ureteric catheter extends into the right-sided urinary bladder. Air within the right renal collecting system which is likely secondary. Residual right renal collecting system stone measures 5 mm.  The dominant right ureteric stone is no longer identified. Increased density along the  tract of the nephrostomy sheath. 8 mm on transverse image 37 and coronal image 64. Improvement to resolution of right sided hydronephrosis. Mild caliectasis remains. No distal ureteric or bladder calculi. Foley catheter within the urinary bladder.  Stomach/Bowel: Normal stomach, without wall thickening. 9.2 cm stool ball within the rectum. Normal terminal ileum. Small bowel again positioned within ventral pelvic laxity.  Vascular/Lymphatic: Aortic and branch vessel atherosclerosis. Small retroperitoneal nodes are not pathologic by size criteria and likely reactive. No pelvic adenopathy.  Reproductive: Normal prostate.  Other: No significant free fluid.  Ventral  abdominal wall laxity.  Musculoskeletal: Lumbosacral spine fixation. Spinal curvature is convex left.  IMPRESSION: 1. Mildly motion degraded exam. 2. Placement of right sided nephrostomy catheter and small bore ureteric catheter. Improvement in appearance of the right kidney with removal of the right ureteric stone. Increased density along the nephrostomy catheter (in the right ureter) could represent small volume residual stone or stone fragments versus contrast. 3. Bilateral nephrolithiasis. 4. Rectal stool burden suggests constipation or even mild fecal impaction. 5. Bilateral gynecomastia. 6. Left adrenal adenoma.   Electronically Signed   By: Jeronimo GreavesKyle  Talbot M.D.   On: 05/18/2014 08:56   Ir Dil Ureter Right  05/17/2014   CLINICAL DATA:  74 year old male with obstructed pyonephrosis in the setting of nephrolithiasis. Percutaneous nephrostomy tube was placed on 04/17/2014. Patient now set to undergo percutaneous nephrolithotomy. Dr. Archer AsaMcCullough assisting Dr. Isabel CapriceGrapey.  EXAM: DIL URETER W/O STENT*R*; DG C-ARM 1-60 MIN - NRPT [facility]  Date: 05/17/2014  PROCEDURE: 1. Catheterization of the ureter and bladder from the existing percutaneous nephrostomy access 2. Balloon dilatation of percutaneous tract into the collecting system followed by placement of a large-bore sheath.  ANESTHESIA/SEDATION: General anesthesia provided by the anesthesiology service.  MEDICATIONS: Gentamicin administered intravenously.  FLUOROSCOPY TIME:  Please see operative report for further detail  CONTRAST:  10 mL contrast administered into the collecting system  TECHNIQUE: Informed consent was obtained from the patient following explanation of the procedure, risks, benefits and alternatives. The patient understands, agrees and consents for the procedure. All questions were addressed. A time out was performed.  Maximal barrier sterile technique utilized including caps, mask, sterile gowns, sterile gloves, large sterile drape, hand hygiene, and  Betadine skin prep.  The existing retention suture was cut. The percutaneous nephrostomy tube hub was transected and a wire advanced into the renal pelvis. The tube was removed over the wire. A 5 [place] Kum[place] the catheter was then introduced into the renal pelvis. A hand injection of contrast material demonstrates no evidence of hydronephrosis. There is a large filling defects centrally within the renal pelvis. The Kumpe the catheter was then navigated into the bladder. An Amplatz wire was coiled within the bladder in the Kumpe the catheter removed.  A peel-away sheath was then advanced over the wire and into the mid ureter. A second safety Amplatz wire was then advanced into the bladder. The peel-away sheath was removed.  A 7 mm balloon dilatation catheter with an accompanying large-bore sheath was then introduced over the wire and positioned in the subcutaneous tract renal parenchyma. The balloon was dilated and the large-bore sheath advanced over the balloon and into the renal collecting system.  I then state in the operating room into will we were are short that the percutaneous renal access was satisfactory. Percutaneous nephrolithotomy was then carried out by Dr. Isabel CapriceGrapey.  COMPLICATIONS: None  IMPRESSION: Ureteral and bladder catheterization via existing percutaneous nephrostomy tube followed by tract dilatation and large-bore sheath  placement.  Percutaneous nephrolithotomy then performed by Dr. Isabel Caprice.  Signed,  Sterling Big, MD  [facility]  [facility]   Electronically Signed   By: Malachy Moan M.D.   On: 05/17/2014 18:00   Dg C-arm 1-60 Min-no Report  05/17/2014   CLINICAL DATA: surgery   C-ARM 1-60 MINUTES  Fluoroscopy was utilized by the requesting physician.  No radiographic  interpretation.       Hospital Course:  Active Problems:   Nephrolithiasis Mr. Lagrange is 74 years of age. Approximately a year ago he presented with urosepsis  and had a fairly large stone in his contralateral left ureter with resultant urosepsis. That required urgent stent placement with treatment of his sepsis and subsequent definitive ureteroscopy. The patient presented approximately year later with a fairly similar scenario this time with a very large 3 cm stone in his proximal right ureter. A nephrostomy tube was placed on urgent basis by interventional radiology to decompress the system. The patient subsequently had resolution of his urosepsis and went back to his nursing facility. He was seen and evaluated in our office recently. On KUB imaging the nephrostomy tube appear to be in good position and a faint 3 cm calcification was noted in the proximal right ureter. Urine culture in our office from the nephrostomy tube showed no growth. On the day of admission he underwent a percutaneous nephrolithotomy. Nephrostomy tube was left in place. The next morning CT showed no evidence of extravasation/urinoma. No hydronephrosis. No definitive residual stone. The patient will ever continue to complain of fairly significant nausea with recurrent emesis for approximately 36 hours status post surgery. We kept him for observation and supportive care. The nausea resolved and his by mouth intake increased. He is now without complaints of flank pain. He has no ongoing nausea or emesis. He is afebrile.  Day of Discharge BP 159/63 mmHg  Pulse 64  Temp(Src) 98.5 F (36.9 C) (Oral)  Resp 20  Ht  (1.854 m)  Wt 104.2 kg (229 lb 11.5 oz)  BMI 30.31 kg/m2  SpO2 95%   In no acute distress. Mildly confused as baseline Respiratory effort normal Abdomen soft and nontender nephrostomy tube site okay Extremities mild symmetric edema  Results for orders placed or performed during the hospital encounter of 05/17/14 (from the past 24 hour(s))  Glucose, capillary     Status: Abnormal   Collection Time: 05/19/14  8:48 PM  Result Value Ref Range   Glucose-Capillary 149 (H) 65  - 99 mg/dL   Comment 1 Notify RN    Comment 2 Document in Chart

## 2014-10-30 ENCOUNTER — Ambulatory Visit: Payer: Medicare Other | Admitting: Podiatry

## 2014-11-01 ENCOUNTER — Ambulatory Visit (INDEPENDENT_AMBULATORY_CARE_PROVIDER_SITE_OTHER): Payer: Medicare Other | Admitting: Podiatry

## 2014-11-01 ENCOUNTER — Encounter: Payer: Self-pay | Admitting: Podiatry

## 2014-11-01 DIAGNOSIS — L609 Nail disorder, unspecified: Secondary | ICD-10-CM | POA: Diagnosis not present

## 2014-11-01 DIAGNOSIS — L608 Other nail disorders: Secondary | ICD-10-CM

## 2014-11-01 NOTE — Patient Instructions (Signed)
Return every 6 months for trimming of toenails or sooner if you have concern Diabetes and Foot Care Diabetes may cause you to have problems because of poor blood supply (circulation) to your feet and legs. This may cause the skin on your feet to become thinner, break easier, and heal more slowly. Your skin may become dry, and the skin may peel and crack. You may also have nerve damage in your legs and feet causing decreased feeling in them. You may not notice minor injuries to your feet that could lead to infections or more serious problems. Taking care of your feet is one of the most important things you can do for yourself.  HOME CARE INSTRUCTIONS  Wear shoes at all times, even in the house. Do not go barefoot. Bare feet are easily injured.  Check your feet daily for blisters, cuts, and redness. If you cannot see the bottom of your feet, use a mirror or ask someone for help.  Wash your feet with warm water (do not use hot water) and mild soap. Then pat your feet and the areas between your toes until they are completely dry. Do not soak your feet as this can dry your skin.  Apply a moisturizing lotion or petroleum jelly (that does not contain alcohol and is unscented) to the skin on your feet and to dry, brittle toenails. Do not apply lotion between your toes.  Trim your toenails straight across. Do not dig under them or around the cuticle. File the edges of your nails with an emery board or nail file.  Do not cut corns or calluses or try to remove them with medicine.  Wear clean socks or stockings every day. Make sure they are not too tight. Do not wear knee-high stockings since they may decrease blood flow to your legs.  Wear shoes that fit properly and have enough cushioning. To break in new shoes, wear them for just a few hours a day. This prevents you from injuring your feet. Always look in your shoes before you put them on to be sure there are no objects inside.  Do not cross your legs.  This may decrease the blood flow to your feet.  If you find a minor scrape, cut, or break in the skin on your feet, keep it and the skin around it clean and dry. These areas may be cleansed with mild soap and water. Do not cleanse the area with peroxide, alcohol, or iodine.  When you remove an adhesive bandage, be sure not to damage the skin around it.  If you have a wound, look at it several times a day to make sure it is healing.  Do not use heating pads or hot water bottles. They may burn your skin. If you have lost feeling in your feet or legs, you may not know it is happening until it is too late.  Make sure your health care provider performs a complete foot exam at least annually or more often if you have foot problems. Report any cuts, sores, or bruises to your health care provider immediately. SEEK MEDICAL CARE IF:   You have an injury that is not healing.  You have cuts or breaks in the skin.  You have an ingrown nail.  You notice redness on your legs or feet.  You feel burning or tingling in your legs or feet.  You have pain or cramps in your legs and feet.  Your legs or feet are numb.  Your feet  always feel cold. SEEK IMMEDIATE MEDICAL CARE IF:   There is increasing redness, swelling, or pain in or around a wound.  There is a red line that goes up your leg.  Pus is coming from a wound.  You develop a fever or as directed by your health care provider.  You notice a bad smell coming from an ulcer or wound.   This information is not intended to replace advice given to you by your health care provider. Make sure you discuss any questions you have with your health care provider.   Document Released: 12/21/1999 Document Revised: 08/25/2012 Document Reviewed: 06/01/2012 Elsevier Interactive Patient Education [employer].

## 2014-11-01 NOTE — Progress Notes (Signed)
Patient ID: Henry SproutWilliam Kimberley, male   DOB: [birth year], 74 y.o.   MRN: [id number]  Subjective: This patient presents with his wife in the treatment room requesting nail debridement. He is seated in a wheelchair not able to transfer  Objective: No open skin lesions noted bilaterally The toenails are incurvated and mildly elongated. 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

## 2015-04-25 ENCOUNTER — Ambulatory Visit (INDEPENDENT_AMBULATORY_CARE_PROVIDER_SITE_OTHER): Payer: Medicare Other | Admitting: Podiatry

## 2015-04-25 ENCOUNTER — Encounter: Payer: Self-pay | Admitting: Podiatry

## 2015-04-25 ENCOUNTER — Ambulatory Visit: Payer: Medicare Other | Admitting: Podiatry

## 2015-04-25 DIAGNOSIS — L608 Other nail disorders: Secondary | ICD-10-CM

## 2015-04-25 DIAGNOSIS — L609 Nail disorder, unspecified: Secondary | ICD-10-CM

## 2015-04-25 NOTE — Patient Instructions (Signed)
Subjective: This patient presents with his wife in the treatment room requesting nail debridement. He is seated in a wheelchair not able to transfer  Objective: No open skin lesions noted bilaterally The toenails are incurvated and mildly elongated. 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

## 2015-04-25 NOTE — Progress Notes (Signed)
Patient ID: Henry SproutWilliam Wyatt, male   DOB: 1940/04/12, 75 y.o.   MRN: [id number]  Subjective: This patient presents with his wife in the treatment room requesting nail debridement. He is seated in a wheelchair not able to transfer  Objective: No open skin lesions noted bilaterally The toenails are incurvated and mildly elongated. 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

## 2015-09-20 ENCOUNTER — Encounter (HOSPITAL_COMMUNITY): Payer: Self-pay

## 2015-09-20 ENCOUNTER — Encounter (HOSPITAL_COMMUNITY): Payer: Self-pay | Admitting: Emergency Medicine

## 2015-09-20 ENCOUNTER — Other Ambulatory Visit (HOSPITAL_COMMUNITY): Payer: Self-pay | Admitting: Geriatric Medicine

## 2015-09-20 ENCOUNTER — Ambulatory Visit (HOSPITAL_COMMUNITY)
Admission: RE | Admit: 2015-09-20 | Discharge: 2015-09-20 | Disposition: A | Discharge status: Home / Self Care | Payer: Medicare Other | Source: Ambulatory Visit | Attending: Vascular Surgery | Admitting: Vascular Surgery

## 2015-09-20 ENCOUNTER — Emergency Department (HOSPITAL_COMMUNITY): Payer: Medicare Other

## 2015-09-20 ENCOUNTER — Emergency Department (HOSPITAL_COMMUNITY)
Admission: EM | Admit: 2015-09-20 | Discharge: 2015-09-20 | Disposition: A | Discharge status: Home / Self Care | Payer: Medicare Other | Attending: Emergency Medicine | Admitting: Emergency Medicine

## 2015-09-20 DIAGNOSIS — M545 Low back pain: Secondary | ICD-10-CM | POA: Insufficient documentation

## 2015-09-20 DIAGNOSIS — S0093XA Contusion of unspecified part of head, initial encounter: Secondary | ICD-10-CM | POA: Insufficient documentation

## 2015-09-20 DIAGNOSIS — E1122 Type 2 diabetes mellitus with diabetic chronic kidney disease: Secondary | ICD-10-CM | POA: Diagnosis not present

## 2015-09-20 DIAGNOSIS — Y929 Unspecified place or not applicable: Secondary | ICD-10-CM | POA: Insufficient documentation

## 2015-09-20 DIAGNOSIS — Z79899 Other long term (current) drug therapy: Secondary | ICD-10-CM | POA: Insufficient documentation

## 2015-09-20 DIAGNOSIS — M79605 Pain in left leg: Principal | ICD-10-CM

## 2015-09-20 DIAGNOSIS — Y999 Unspecified external cause status: Secondary | ICD-10-CM | POA: Insufficient documentation

## 2015-09-20 DIAGNOSIS — G20 Parkinson's disease: Secondary | ICD-10-CM | POA: Diagnosis not present

## 2015-09-20 DIAGNOSIS — W19XXXA Unspecified fall, initial encounter: Secondary | ICD-10-CM

## 2015-09-20 DIAGNOSIS — W228XXA Striking against or struck by other objects, initial encounter: Secondary | ICD-10-CM | POA: Insufficient documentation

## 2015-09-20 DIAGNOSIS — I5042 Chronic combined systolic (congestive) and diastolic (congestive) heart failure: Secondary | ICD-10-CM | POA: Diagnosis not present

## 2015-09-20 DIAGNOSIS — N183 Chronic kidney disease, stage 3 (moderate): Secondary | ICD-10-CM | POA: Insufficient documentation

## 2015-09-20 DIAGNOSIS — S0990XA Unspecified injury of head, initial encounter: Secondary | ICD-10-CM | POA: Diagnosis present

## 2015-09-20 DIAGNOSIS — M79641 Pain in right hand: Secondary | ICD-10-CM | POA: Diagnosis not present

## 2015-09-20 DIAGNOSIS — I130 Hypertensive heart and chronic kidney disease with heart failure and stage 1 through stage 4 chronic kidney disease, or unspecified chronic kidney disease: Secondary | ICD-10-CM | POA: Insufficient documentation

## 2015-09-20 DIAGNOSIS — Y939 Activity, unspecified: Secondary | ICD-10-CM | POA: Diagnosis not present

## 2015-09-20 DIAGNOSIS — Z87891 Personal history of nicotine dependence: Secondary | ICD-10-CM | POA: Insufficient documentation

## 2015-09-20 DIAGNOSIS — M79604 Pain in right leg: Secondary | ICD-10-CM

## 2015-09-20 MED ORDER — ACETAMINOPHEN 500 MG PO TABS
1000.0000 mg | ORAL_TABLET | Freq: Once | ORAL | Status: AC
  Administered 2015-09-20: 1000 mg via ORAL
  Filled 2015-09-20: qty 2

## 2015-09-20 NOTE — ED Provider Notes (Signed)
[facility]EMER[facility]ENCY DEPT Provider Note   CSN: [id number] Arrival date & time: 09/20/15  1227     History   Chief Complaint Chief Complaint  Patient presents with  . Fall    HPI Henry Wyatt is a 75 y.o. male.  The history is provided by the patient and the EMS personnel.  Fall  This is a new problem. The current episode started 1 to 2 hours ago. The problem occurs rarely. The problem has not changed since onset.Pertinent negatives include no chest pain, no abdominal pain, no headaches and no shortness of breath. Nothing aggravates the symptoms. Nothing relieves the symptoms. He has tried nothing for the symptoms.   75 year old male with history of Parkinson's, hypertension, diabetes, and mild dementia who presents after fall. Patient is wheelchair-bound. States that people were helping put his feet up on a chair while he was in his wheelchair when his chair rolled backwards. He fell backwards and hit his head on the ground. He did not have loss of consciousness. Complains of chronic low back pain, which is now worse. Has not taken anything for pain. Pain worse with movement, and with no major alleviating factors. Reports hematoma to the back of the head. Denies any chest pain, difficulty breathing, abdominal pain, nausea or vomiting, numbness or weakness. Also complains of right wrist pain.  Past Medical History:  Diagnosis Date  . Acute kidney injury (HCC)    01/05/13   . Acute pyelonephritis    01/05/13   . Altered mental status    01/05/13   . Back pain   . C. difficile diarrhea    01/05/13- admitted to hospital   . CHF (congestive heart failure) (HCC)    combined sys and dias heart failure 01/05/13   . Constipation   . Dementia   . Diabetes mellitus without complication (HCC)   . Diarrhea    admitted to hospital on 01/05/13   . DVT (deep venous thrombosis) (HCC)   . Dysphagia   . Hydronephrosis    left kidney 01/05/13   . Hypertension   . Hypokalemia   . Influenza A     01/05/13 admitted to hospital   . Kidney injury   . Kidney stone    01/05/13   . Pneumonia    health care associated- 01/05/13   . Septic shock (HCC)    01/05/13- admitted to hospital     Patient Active Problem List   Diagnosis Date Noted  . Nephrolithiasis 05/17/2014  . Right ureteral stone   . Hydronephrosis, right   . Chronic diastolic CHF (congestive heart failure) (HCC)   . Acute on chronic kidney failure (HCC)   . Parkinson's disease (HCC)   . UTI (urinary tract infection) 04/17/2014  . Acute encephalopathy 04/17/2014  . Hypokalemia 01/10/2013  . Other dysphagia 01/08/2013  . Acute pyelonephritis 01/06/2013  . Chronic combined systolic and diastolic congestive heart failure (HCC) 01/06/2013  . Severe sepsis(995.92) 01/05/2013  . Diarrhea 01/05/2013  . Septic shock(785.52) 01/05/2013  . HCAP (healthcare-associated pneumonia) 01/05/2013  . Renal stone 01/05/2013  . Hydronephrosis of left kidney 01/05/2013  . Altered mental status 01/05/2013  . DVT (deep venous thrombosis) (HCC) 01/05/2013  . DM (diabetes mellitus), type 2 with renal complications (HCC) 01/05/2013  . AKI (acute kidney injury) (HCC) 01/05/2013  . CKD (chronic kidney disease), stage III 01/05/2013  . C. difficile diarrhea 01/05/2013  . Influenza A 01/05/2013    Past Surgical History:  Procedure Laterality Date  . BACK SURGERY    .  CYSTOSCOPY W/ URETERAL STENT PLACEMENT Left 01/05/2013   Procedure: CYSTOSCOPY WITH RETROGRADE PYELOGRAM/URETERAL STENT PLACEMENT;  Surgeon: Valetta Fulleravid S Grapey, MD;  Location: [facility];  Service: Urology;  Laterality: Left;  . CYSTOSCOPY WITH URETEROSCOPY Left 01/19/2013   Procedure: CYSTOSCOPY WITH URETEROSCOPY;  Surgeon: Valetta Fulleravid S Grapey, MD;  Location: [facility];  Service: Urology;  Laterality: Left;  REMOVAL OF JJ STENT      . HOLMIUM LASER APPLICATION Left 01/19/2013   Procedure: HOLMIUM LASER APPLICATION;  Surgeon: Valetta Fulleravid S Grapey, MD;  Location: [facility];  Service: Urology;   Laterality: Left;  . NEPHROLITHOTOMY Right 05/17/2014   Procedure: NEPHROLITHOTOMY PERCUTANEOUS;  Surgeon: Barron Alvineavid Grapey, MD;  Location: [facility];  Service: Urology;  Laterality: Right;       Home Medications    Prior to Admission medications   Medication Sig Start Date End Date Taking? Authorizing Provider  acetaminophen (TYLENOL) 500 MG tablet Take 1,000 mg by mouth every 8 (eight) hours as needed for mild pain.    Historical Provider, MD  bisacodyl (DULCOLAX) 5 MG EC tablet Take 5-10 mg by mouth daily as needed for moderate constipation.    Historical Provider, MD  carbidopa-levodopa (SINEMET IR) 10-100 MG per tablet Take 1 tablet by mouth 3 (three) times daily.    Historical Provider, MD  feeding supplement, ENSURE ENLIVE, (ENSURE ENLIVE) LIQD Take 237 mLs by mouth 2 (two) times daily between meals. 04/23/14   Christiane Haorinna L Sullivan, MD  HYDROcodone-acetaminophen (NORCO/VICODIN) 5-325 MG per tablet Take 1-2 tablets by mouth every 6 (six) hours as needed for moderate pain.  05/14/14   Historical Provider, MD  LEVEMIR 100 UNIT/ML injection Inject 4 Units into the skin at bedtime.  06/24/13   Historical Provider, MD  memantine (NAMENDA XR) 28 MG CP24 24 hr capsule Take 28 mg by mouth daily with breakfast.     Historical Provider, MD  metoprolol succinate (TOPROL-XL) 50 MG 24 hr tablet Take 1 tablet (50 mg total) by mouth daily. 04/23/14   Christiane Haorinna L Sullivan, MD  mineral oil liquid Place 0.1 mLs into both ears daily.    Historical Provider, MD  nitroGLYCERIN (NITROSTAT) 0.4 MG SL tablet Place 0.4 mg under the tongue every 5 (five) minutes as needed for chest pain.    Historical Provider, MD  ondansetron (ZOFRAN) 4 MG tablet Take 1 tablet (4 mg total) by mouth every 8 (eight) hours as needed for nausea or vomiting. 05/19/14   Barron Alvineavid Grapey, MD  rOPINIRole (REQUIP) 0.5 MG tablet Take 0.5 mg by mouth 3 (three) times daily.    Historical Provider, MD  senna (SENOKOT) 8.6 MG TABS tablet Take 1 tablet by mouth at  bedtime.    Historical Provider, MD  traMADol-acetaminophen (ULTRACET) 37.5-325 MG per tablet Take 1 tablet by mouth every 6 (six) hours as needed. 05/20/14   Jethro BolusSigmund Tannenbaum, MD    Family History History reviewed. No pertinent family history.  Social History Social History  Substance Use Topics  . Smoking status: Former Games developermoker  . Smokeless tobacco: Never Used  . Alcohol use No     Allergies   Carbapenems; Cephalosporins; Erythromycin; Penicillins; and Sulfa antibiotics   Review of Systems Review of Systems  Constitutional: Negative for fever.  Respiratory: Negative for shortness of breath.   Cardiovascular: Negative for chest pain.  Gastrointestinal: Negative for abdominal pain.  Neurological: Negative for headaches.  All other systems reviewed and are negative.    Physical Exam Updated Vital Signs BP 152/96   Pulse 64  Temp 97.6 F (36.4 C) (Oral)   Resp 15   Ht 6\' 1"  (1.854 m)   Wt 220 lb (99.8 kg)   SpO2 100%   BMI 29.03 kg/m   Physical Exam  Physical Exam  Nursing note and vitals reviewed. Constitutional: Well developed, well nourished, non-toxic, and in no acute distress Head: Normocephalic and small hematoma to back of the head.  Mouth/Throat: Oropharynx is clear and moist.  Neck: Normal range of motion. Neck supple. Eyes: PERRL  Cardiovascular: Normal rate and regular rhythm.   Pulmonary/Chest: Effort normal and breath sounds normal.  Abdominal: Soft. There is no tenderness. There is no rebound and no guarding.  Musculoskeletal: Normal range of motion of all 4 extremities. No defromities. Pain w/ palpation over dorsum of right hand w/o deformity or bruising. Lumbar spine tenderness w/o step offs.  Neurological: Alert, no facial droop, fluent speech, moves all extremities symmetrically, sensation to light touch in tact throughout Skin: Skin is warm and dry.  Psychiatric: Cooperative  ED Treatments / Results  Labs (all labs ordered are listed,  but only abnormal results are displayed) Labs Reviewed - No data to display  EKG  EKG Interpretation None       Radiology No results found.  Procedures Procedures (including critical care time)  Medications Ordered in ED Medications  acetaminophen (TYLENOL) tablet 1,000 mg (not administered)     Initial Impression / Assessment and Plan / ED Course  I have reviewed the triage vital signs and the nursing notes.  Pertinent labs & imaging results that were available during my care of the patient were reviewed by me and considered in my medical decision making (see chart for details).  Clinical Course   75 year old male who presents after fall from wheelchair. He is nontoxic in no acute distress. Vital signs are stable. Grossly neurologically intact. With right hand and low back pain to palpation. CT head and cervical spine are negative for acute traumatic injuries. X-rays of the right hand does not show fracture. X-ray of the lumbar spine with chronic changes as well as old fractures but no acute findings. Stable for discharge back to his facility.  Strict return and follow-up instructions reviewed. He expressed understanding of all discharge instructions and felt comfortable with the plan of care.   Final Clinical Impressions(s) / ED Diagnoses   Final diagnoses:  Fall    New Prescriptions New Prescriptions   No medications on file     Lavera Guise, MD 09/20/15 1556

## 2015-09-20 NOTE — ED Notes (Signed)
No abrasions or bruising noted.

## 2015-09-20 NOTE — ED Notes (Signed)
Patient cleaned and changed into new depends. Patients discharge instructions reviewed with patient and care giver at bedside. [facility] has been called for patient to return to facility. Patient with no needs at this time.

## 2015-09-20 NOTE — ED Notes (Addendum)
[facility] here for patient transport, paperwork given

## 2015-09-20 NOTE — ED Notes (Signed)
[facility] coffee[facility].

## 2015-09-20 NOTE — ED Triage Notes (Signed)
Pt was sitting in wheelchair and tipped over backwards. Pt hit head and right wrist hurts. Pt has chronic back pain. EMS reports small hematoma on back of head. Pt alert and oriented per normal. Pt is wheelchair bound. No bloodthinners. 142/78, HR 74, resp 18

## 2015-09-20 NOTE — Discharge Instructions (Signed)
It does not appear you have serious injury from your fall.  Return for worsening symptoms, including confusion, intractable vomiting, or any other symptoms concerning to you.

## 2015-10-30 ENCOUNTER — Ambulatory Visit (INDEPENDENT_AMBULATORY_CARE_PROVIDER_SITE_OTHER): Payer: Medicare Other | Admitting: Podiatry

## 2015-10-30 ENCOUNTER — Encounter: Payer: Self-pay | Admitting: Podiatry

## 2015-10-30 DIAGNOSIS — L608 Other nail disorders: Secondary | ICD-10-CM | POA: Diagnosis not present

## 2015-10-30 DIAGNOSIS — E0841 Diabetes mellitus due to underlying condition with diabetic mononeuropathy: Secondary | ICD-10-CM | POA: Diagnosis not present

## 2015-10-30 NOTE — Progress Notes (Signed)
Patient ID: Henry SproutWilliam Cantrell, male   DOB: Aug 13, 1940, 75 y.o.   MRN: [id number]    Subjective: This patient presents with his wife in the treatment room requesting nail debridement. He is seated in a wheelchair not able to transfer . Patient presents for ongoing examination and debridement of toenails  Objective: Patient wearing lower extremity compression hose bilaterally Trace palpable pedal pulses bilaterally Bilateral peripheral pitting edema Capillary reflex immediate bilaterally Sensation to 10 g monofilament wire intact 3/5 right and 2/5 left Vibratory sensation nonreactive bilaterally No open skin lesions noted bilaterally The toenails are incurvated and mildly elongated. There is no texture or color changes for the nail plate [id number]  Assessment: Incurvated non-dystrophic toenails 6-10 Diabetic peripheral neuropathy Vascular status difficult to evaluate because of peripheral edema and compression hose  Plan: Debrided toenails electrically 10 without any bleeding Extend visits to 6 months

## 2015-10-30 NOTE — Patient Instructions (Signed)
Today the visit demonstrated no open skin lesions. Peripheral pitting edema bilaterally Decreased sensation bilaterally Incurvated toenails 6-10 Diabetic The toenails 6-10 were debrided mechanically and electronically without any bleeding Reappoint 6 months or sooner if there is concern Carrington Clampichard C Laynie Espy DPM [facility] 10/30/2015   Diabetes and Foot Care Diabetes may cause you to have problems because of poor blood supply (circulation) to your feet and legs. This may cause the skin on your feet to become thinner, break easier, and heal more slowly. Your skin may become dry, and the skin may peel and crack. You may also have nerve damage in your legs and feet causing decreased feeling in them. You may not notice minor injuries to your feet that could lead to infections or more serious problems. Taking care of your feet is one of the most important things you can do for yourself.  HOME CARE INSTRUCTIONS  Wear shoes at all times, even in the house. Do not go barefoot. Bare feet are easily injured.  Check your feet daily for blisters, cuts, and redness. If you cannot see the bottom of your feet, use a mirror or ask someone for help.  Wash your feet with warm water (do not use hot water) and mild soap. Then pat your feet and the areas between your toes until they are completely dry. Do not soak your feet as this can dry your skin.  Apply a moisturizing lotion or petroleum jelly (that does not contain alcohol and is unscented) to the skin on your feet and to dry, brittle toenails. Do not apply lotion between your toes.  Trim your toenails straight across. Do not dig under them or around the cuticle. File the edges of your nails with an emery board or nail file.  Do not cut corns or calluses or try to remove them with medicine.  Wear clean socks or stockings every day. Make sure they are not too tight. Do not wear knee-high stockings since they may decrease blood flow to your  legs.  Wear shoes that fit properly and have enough cushioning. To break in new shoes, wear them for just a few hours a day. This prevents you from injuring your feet. Always look in your shoes before you put them on to be sure there are no objects inside.  Do not cross your legs. This may decrease the blood flow to your feet.  If you find a minor scrape, cut, or break in the skin on your feet, keep it and the skin around it clean and dry. These areas may be cleansed with mild soap and water. Do not cleanse the area with peroxide, alcohol, or iodine.  When you remove an adhesive bandage, be sure not to damage the skin around it.  If you have a wound, look at it several times a day to make sure it is healing.  Do not use heating pads or hot water bottles. They may burn your skin. If you have lost feeling in your feet or legs, you may not know it is happening until it is too late.  Make sure your health care provider performs a complete foot exam at least annually or more often if you have foot problems. Report any cuts, sores, or bruises to your health care provider immediately. SEEK MEDICAL CARE IF:   You have an injury that is not healing.  You have cuts or breaks in the skin.  You have an ingrown nail.  You notice redness on your  legs or feet.  You feel burning or tingling in your legs or feet.  You have pain or cramps in your legs and feet.  Your legs or feet are numb.  Your feet always feel cold. SEEK IMMEDIATE MEDICAL CARE IF:   There is increasing redness, swelling, or pain in or around a wound.  There is a red line that goes up your leg.  Pus is coming from a wound.  You develop a fever or as directed by your health care provider.  You notice a bad smell coming from an ulcer or wound.   This information is not intended to replace advice given to you by your health care provider. Make sure you discuss any questions you have with your health care provider.    Document Released: 12/21/1999 Document Revised: 08/25/2012 Document Reviewed: 06/01/2012 Elsevier Interactive Patient Education [employer]

## 2015-12-07 DEATH — deceased

## 2016-04-29 ENCOUNTER — Ambulatory Visit: Payer: Medicare Other | Admitting: Podiatry

## 2016-10-24 IMAGING — CT CT ABD-PELV W/O CM
2 of 4 series · 16 of 46 positions shown, 18 images · non-contrast
Comparison: 01/05/2013

CLINICAL DATA: Code sepsis, acute renal injury, hydronephrosis

EXAM:
CT ABDOMEN AND PELVIS WITHOUT CONTRAST
TECHNIQUE: Multidetector CT imaging of the abdomen and pelvis was performed
following the standard protocol without IV contrast.

[Series 2: abd/ pelvis 5.0 i30f 1 · axial · 0.98mm/px · z∈[-411,-21]mm · 13 of 89 slices shown, 15 images]
[im 7/89  soft-tissue]
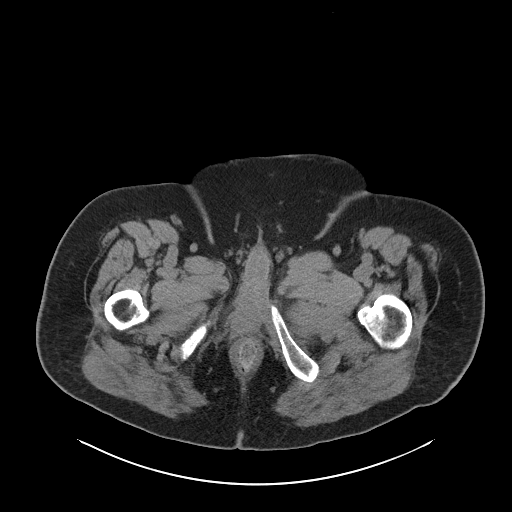
[im 7/89  bone]
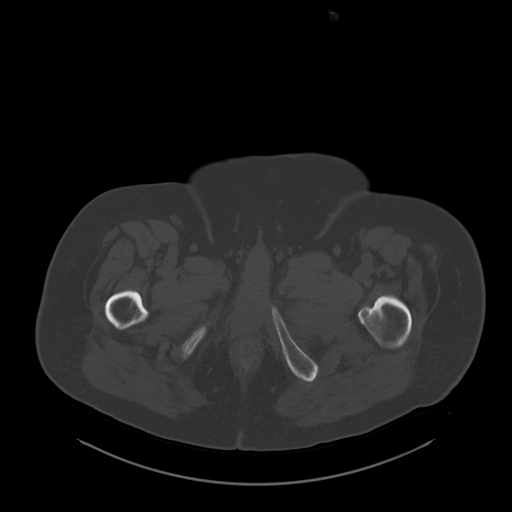
[im 14/89  soft-tissue]
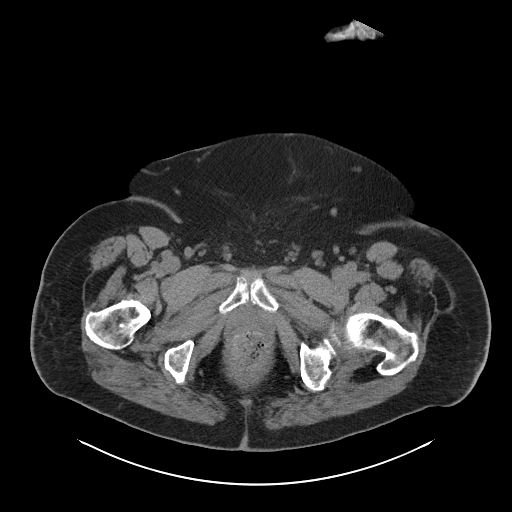
[im 20/89  soft-tissue]
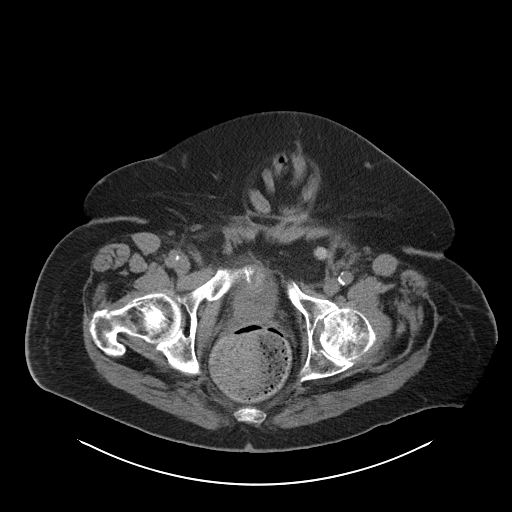
[im 27/89  soft-tissue]
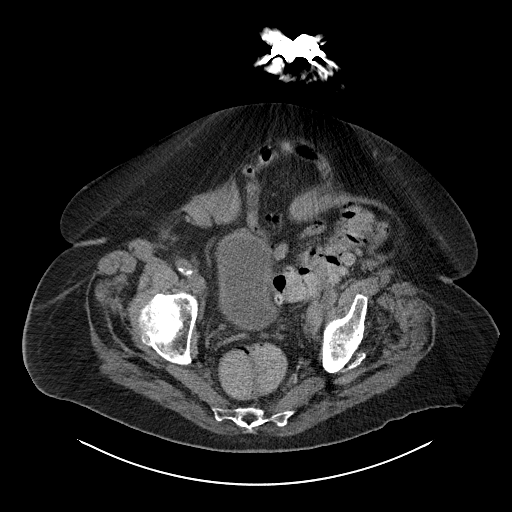
[im 33/89  soft-tissue]
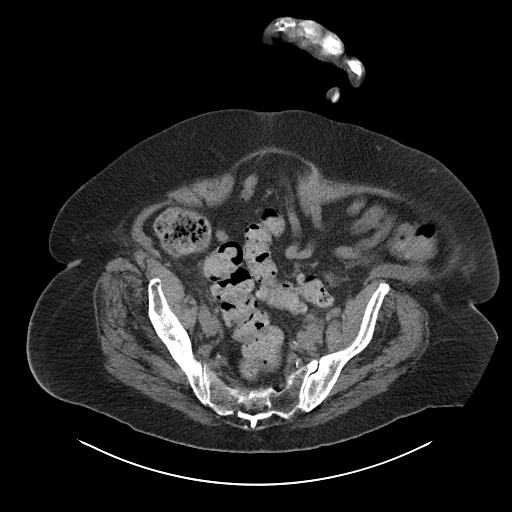
[im 40/89  soft-tissue]
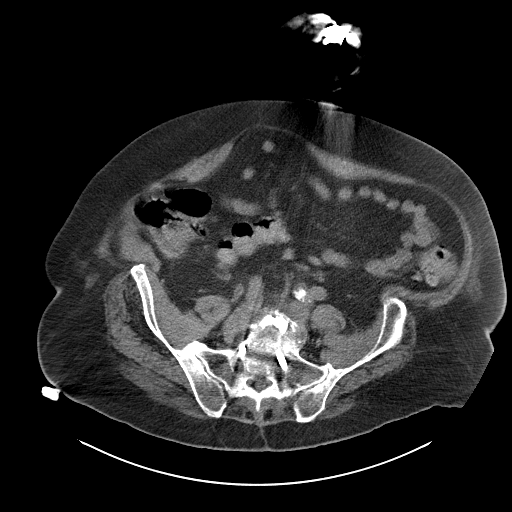
[im 46/89  soft-tissue]
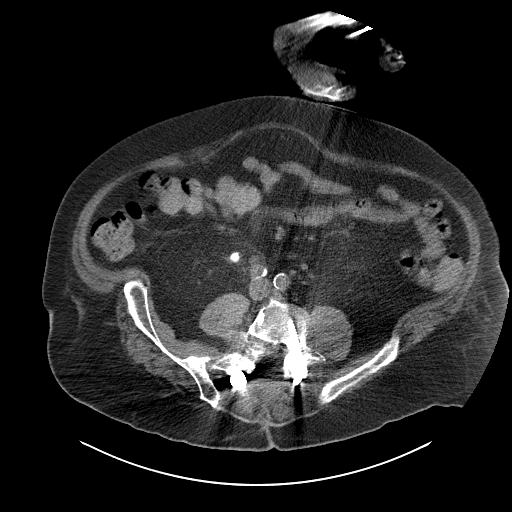
[im 53/89  soft-tissue]
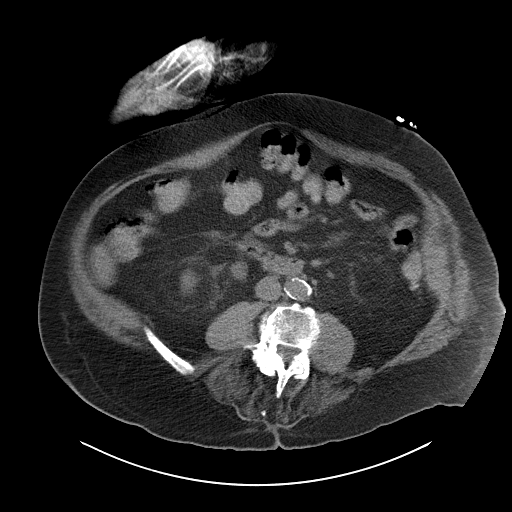
[im 59/89  soft-tissue]
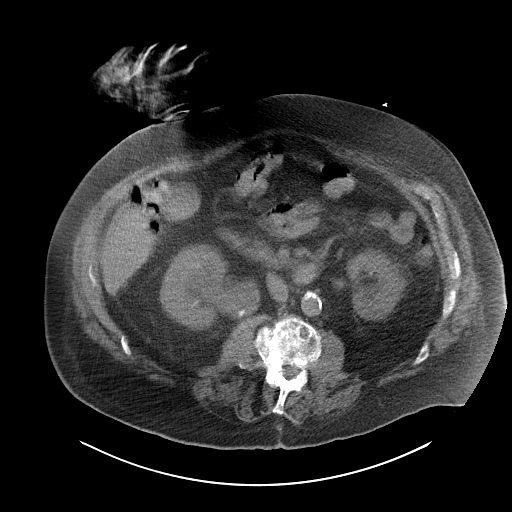
[im 59/89  bone]
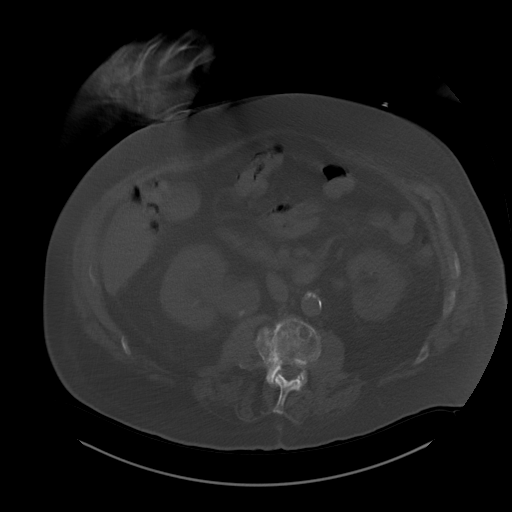
[im 66/89  soft-tissue]
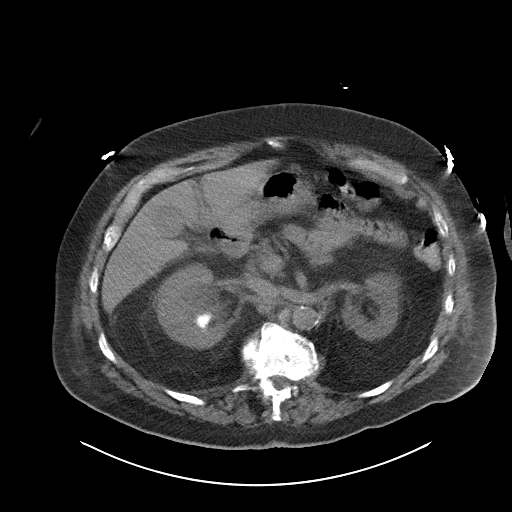
[im 72/89  soft-tissue]
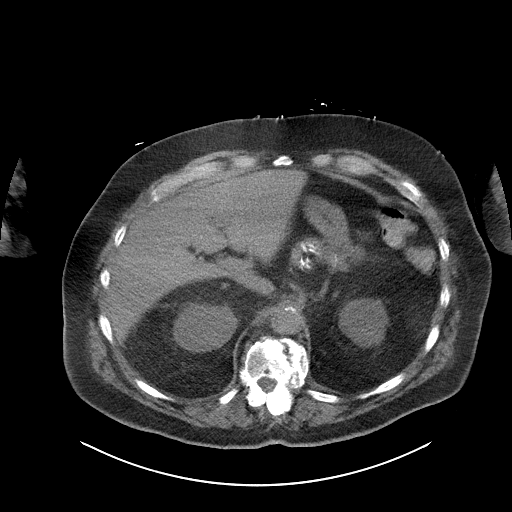
[im 79/89  soft-tissue]
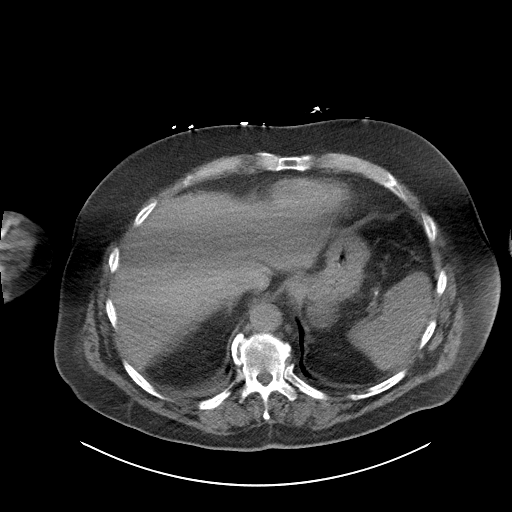
[im 85/89  soft-tissue]
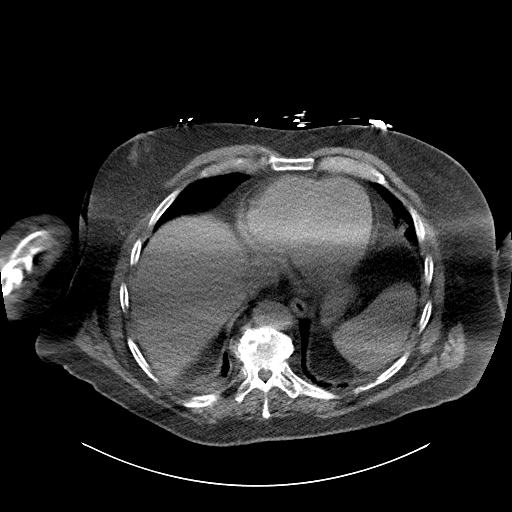

[Series 5: cor st · coronal · 0.87mm/px · 3 of 121 slices shown]
[im 41/121  soft-tissue]
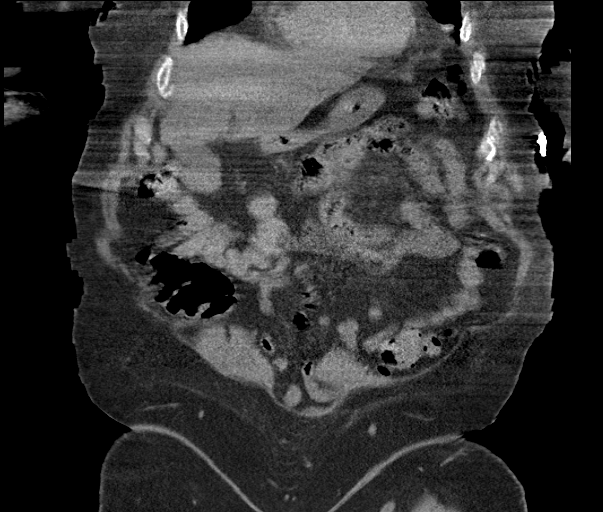
[im 54/121  soft-tissue]
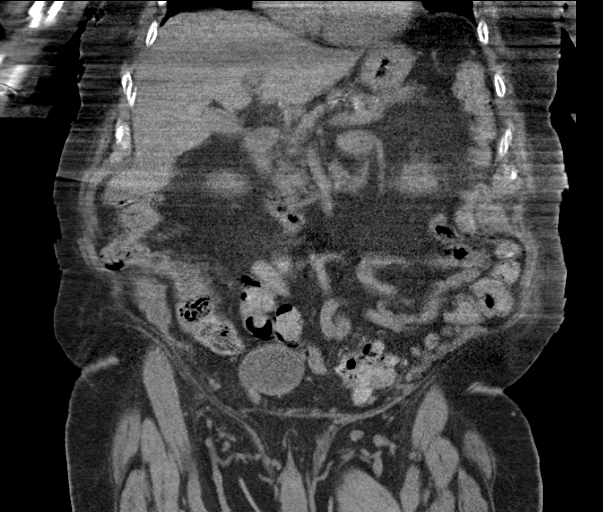
[im 67/121  soft-tissue]
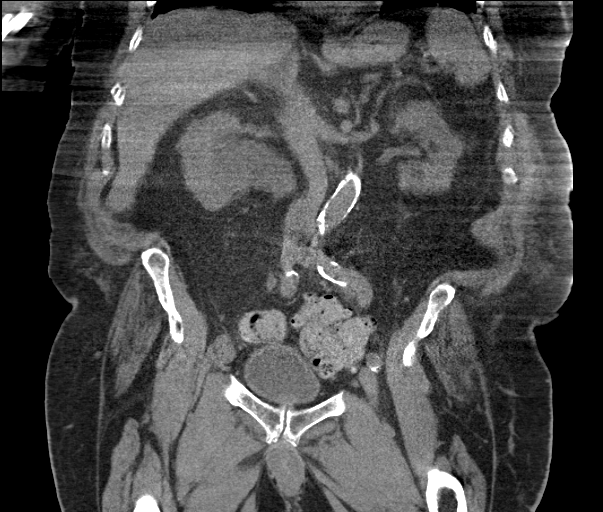

[16 of 46 positions shown; findings below may reference images not displayed]

FINDINGS: Lower chest: Images are degraded by patient motion. Probable minimal
dependent bibasilar atelectasis is identified, suboptimally
visualized.

Hepatobiliary: Extensive streak artifact due to patient's arms and
motion. Unenhanced liver is normal in appearance. Gallbladder is
normal.

Pancreas: Partly fatty infiltrated, normal.

Spleen: Normal

Adrenals/Urinary Tract: Left adrenal adenoma reidentified,
suboptimally visualized due to motion. Bilateral radiopaque renal
calculi are reidentified. There is severe right
hydroureteronephrosis to the level of the 1.3 cm mid right ureteral
calculus image 43. Ureters are otherwise decompressed without other
radiopaque ureteral calculus identified. Trace perinephric
stranding. Bladder is decompressed and unremarkable.

Stomach/Bowel: No bowel wall thickening or focal segmental
dilatation is identified. A few colonic tics are incidentally noted.
Normal appendix. Bowel is contained within otherwise fat containing
ventral abdominal wall hernia without evidence for obstruction or
entrapment at this time. Moderate stool impaction within the rectum.

Vascular/Lymphatic: Moderate atheromatous aortic calcification
without aneurysm. No lymphadenopathy.

Reproductive: Not applicable

Other: Fat containing umbilical hernia is identified. No free fluid
or air.

Musculoskeletal: L5-S1 fusion hardware noted. No evidence for
hardware failure. Severe multilevel disc degenerative changes
identified with leftward curvature centered at L2. Evidence of
previous posterior decompression L1-L2.
IMPRESSION: 1.3 cm mid right ureteral calculus producing severe right
hydroureteronephrosis proximally.

Bilateral radiopaque renal calculi.

Bowel containing ventral abdominal wall hernia without evidence for
obstruction.

## 2016-10-24 IMAGING — XA IR NEPHROSTOMY PLACEMENT RIGHT
1 series · 4 of 4 positions shown · non-contrast
Comparison: CT of the abdomen and pelvis without contrast earlier
today.

CLINICAL DATA: Urosepsis significant right hydronephrosis secondary
to obstructing ureteral calculi. The patient requires emergent
placement of a right-sided percutaneous nephrostomy tube.

EXAM:
1. ULTRASOUND GUIDANCE FOR PUNCTURE OF THE RIGHT RENAL COLLECTING
SYSTEM.
2. RIGHT PERCUTANEOUS NEPHROSTOMY TUBE PLACEMENT.

[Series 1: run · 4 of 4 slices shown]
[im 1/4]
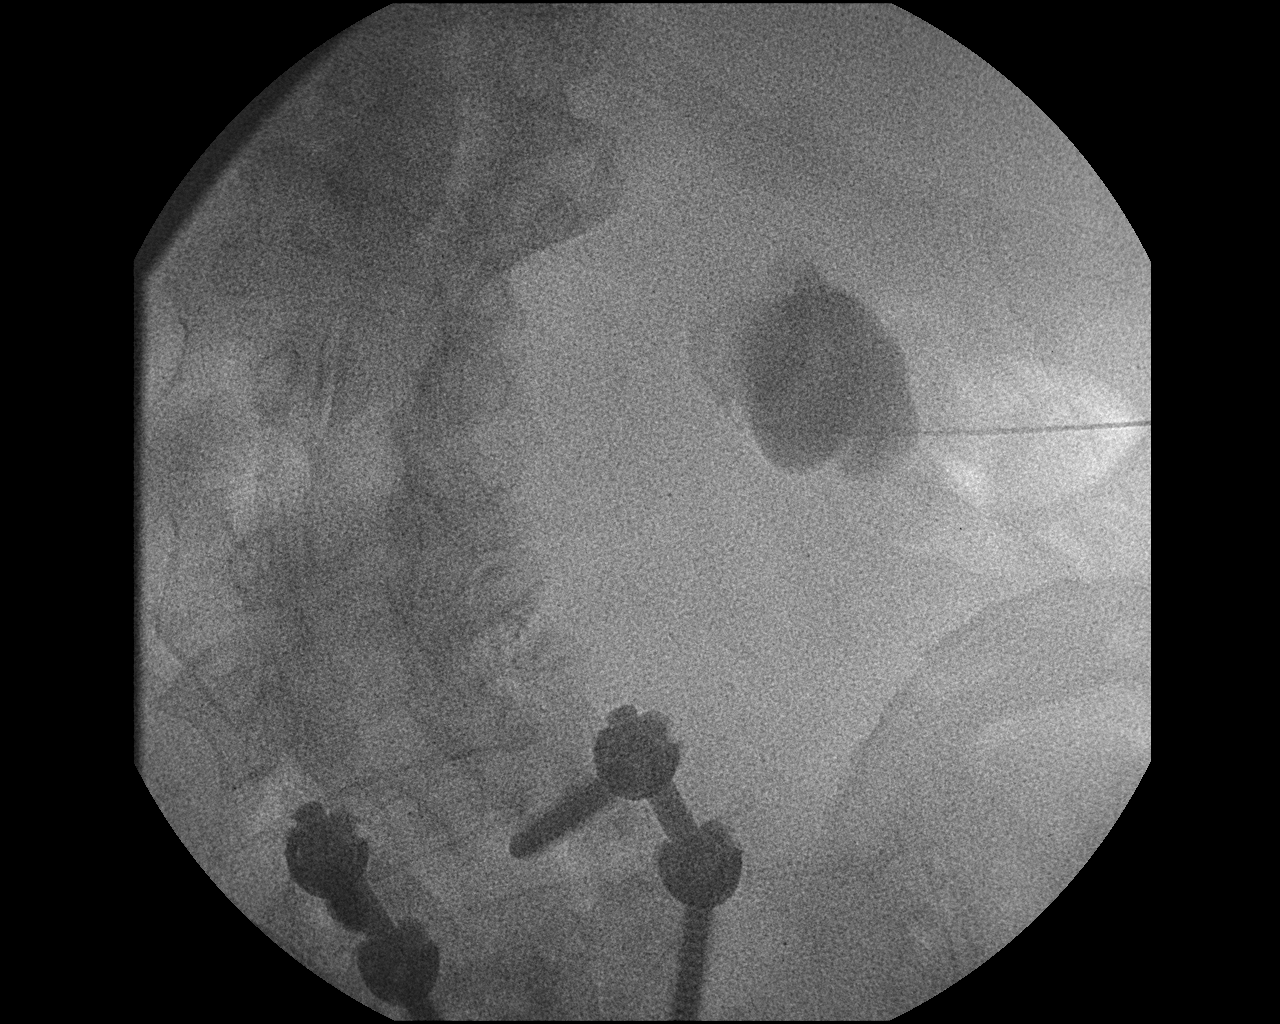
[im 2/4]
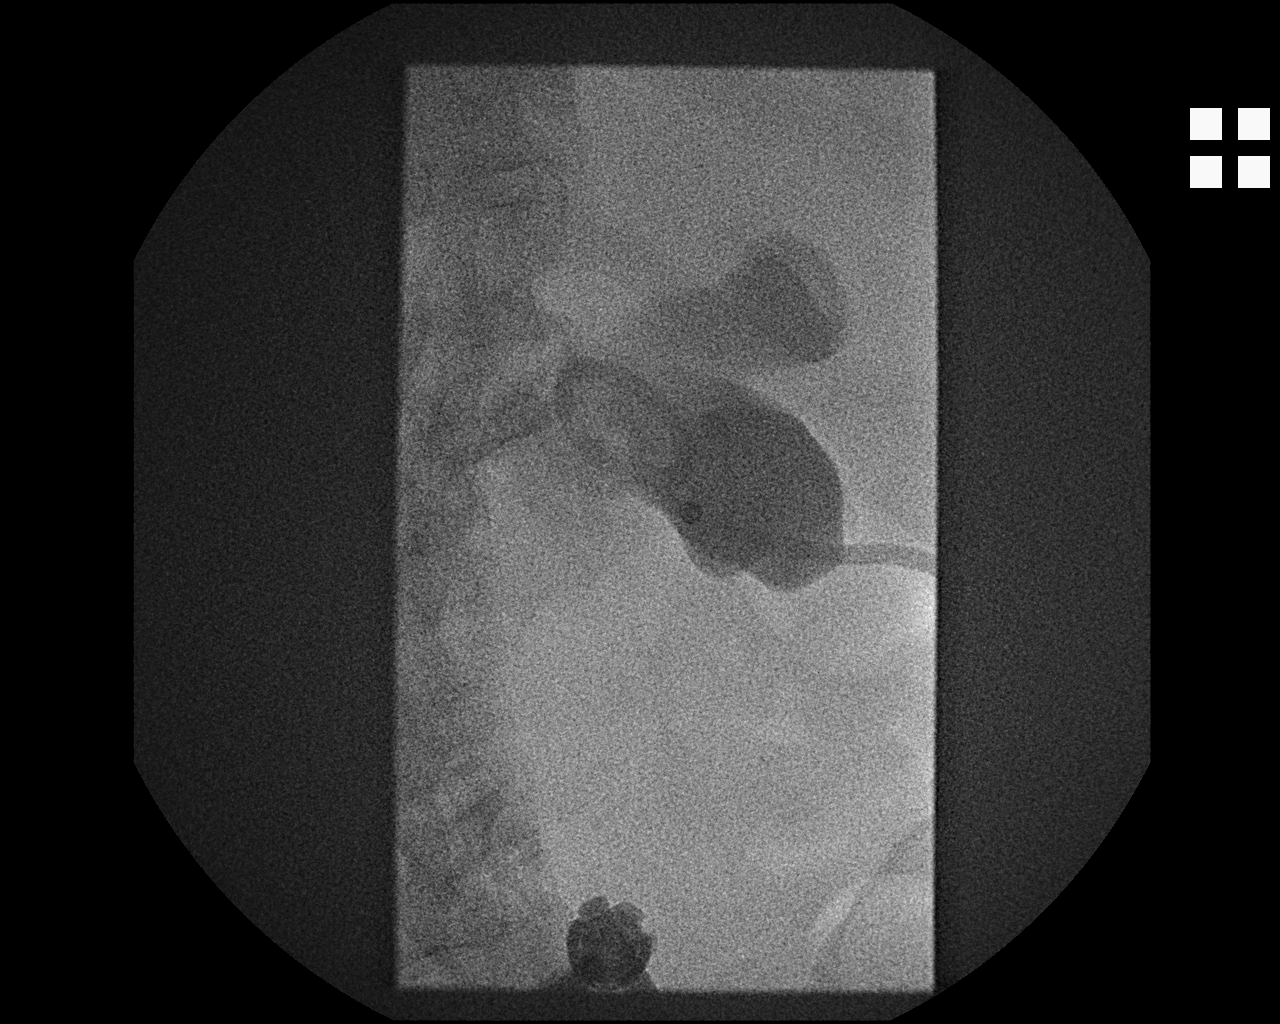
[im 3/4]
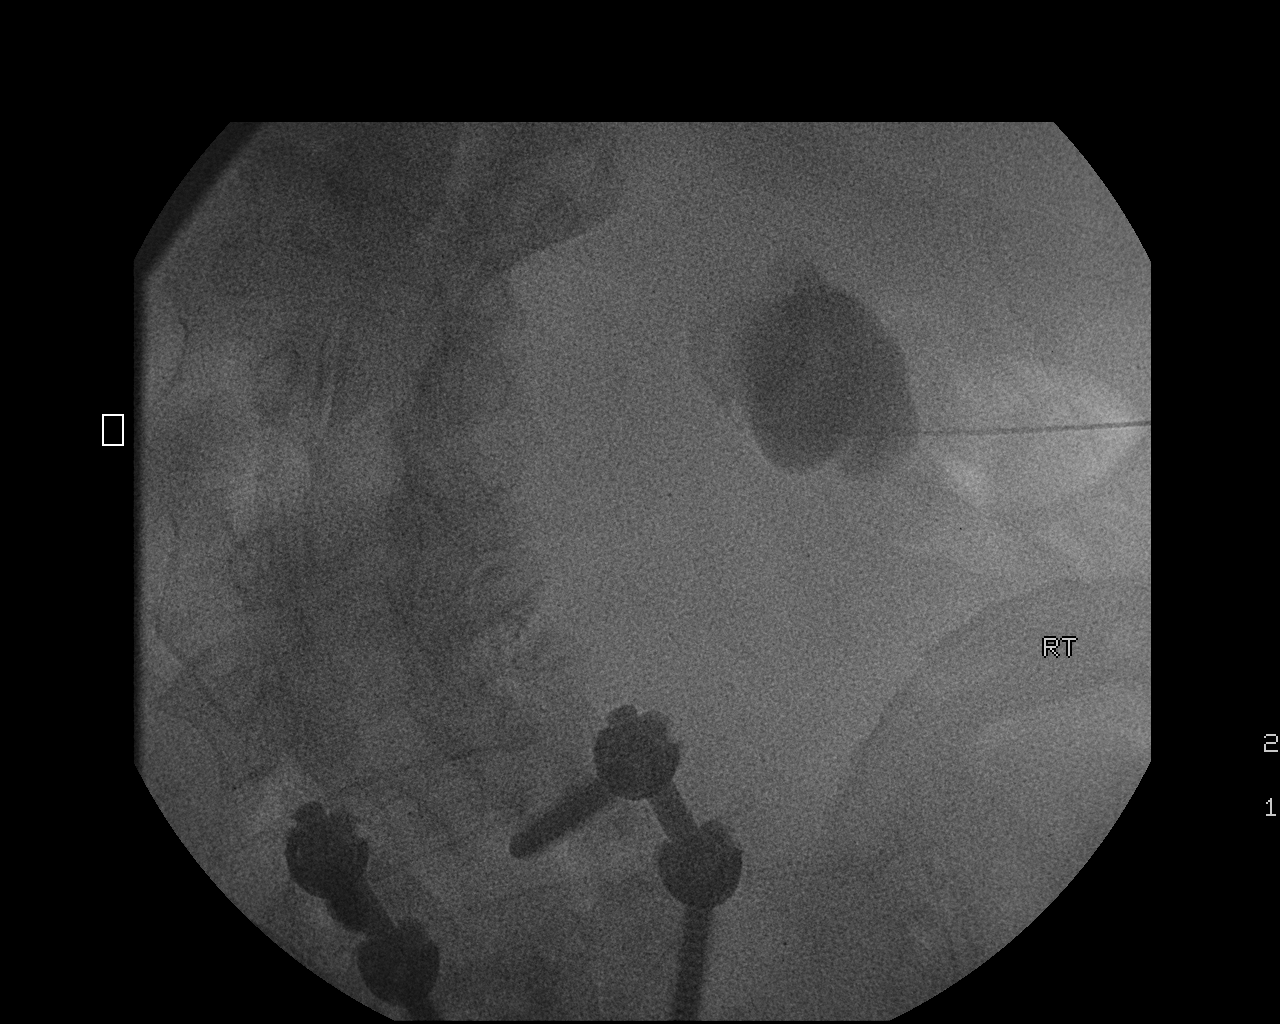
[im 4/4]
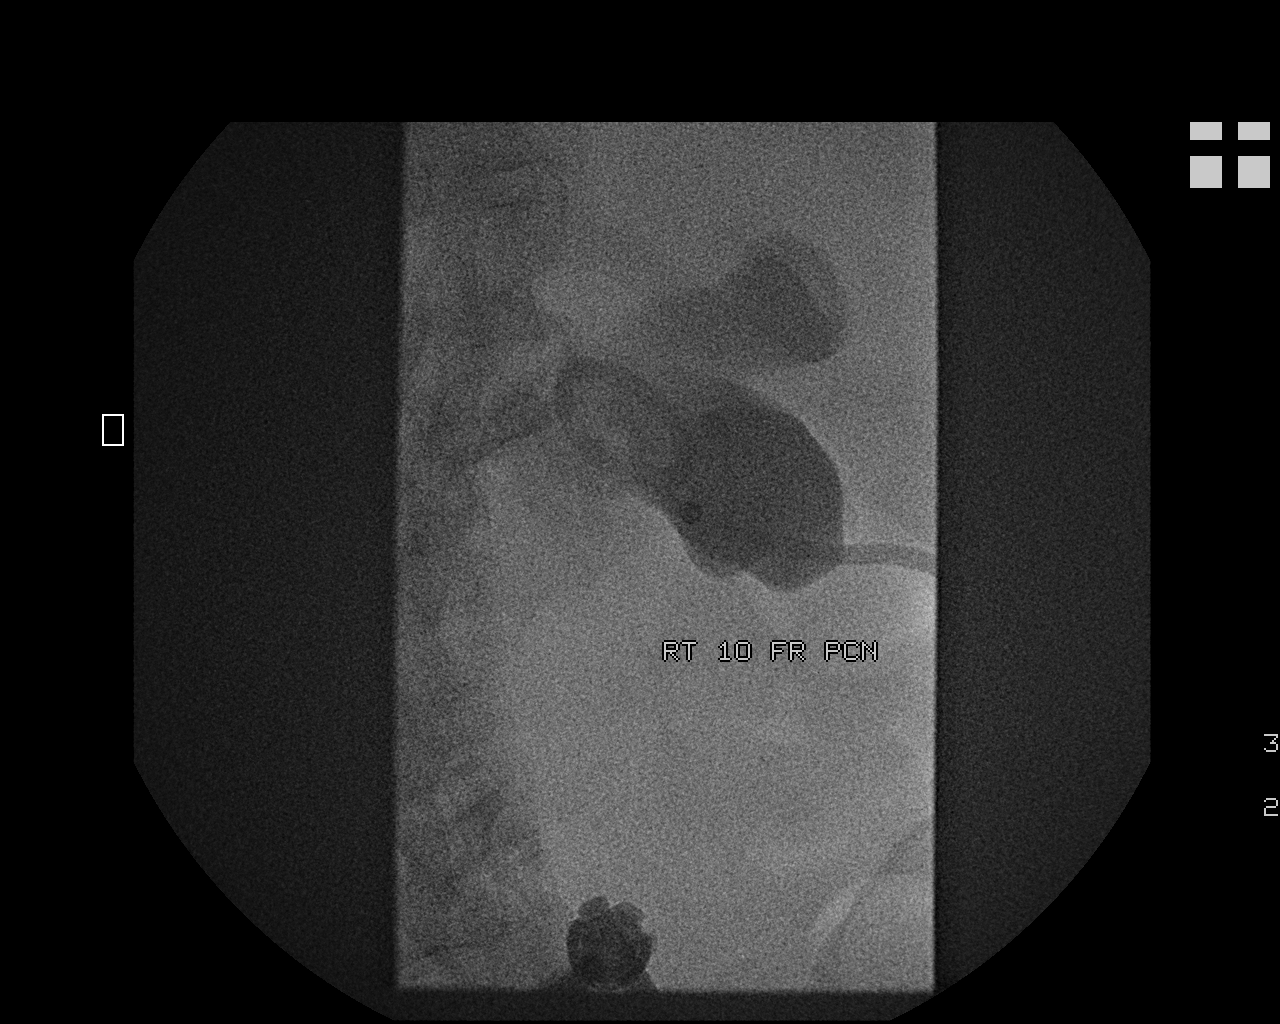

[4 of 4 positions shown; findings below may reference images not displayed]

ANESTHESIA/SEDATION:
1.0 mg IV Versed; 50 mcg IV Fentanyl.

Total Moderate Sedation Time

20 minutes

CONTRAST:  10 ml Omnipaque 300

MEDICATIONS:
The patient was still receiving a scheduled dose of 750 mg IV
Levaquin at the time of initiation of the procedure. Additional
prophylactic antibiotic was not therefore administered.

FLUOROSCOPY TIME:  54 seconds.

PROCEDURE:
The procedure, risks, benefits, and alternatives were explained to
the patient's wife. Questions regarding the procedure were
encouraged and answered. The patient's wife understands and consents
to the procedure. The patient is demented and was confused prior to
the procedure and could not consent himself.

A time-out was performed prior to the procedure. The right flank
region was prepped with Betadine in a sterile fashion, and a sterile
drape was applied covering the operative field. A sterile gown and
sterile gloves were used for the procedure. Local anesthesia was
provided with 1% Lidocaine.

Ultrasound was used to localize the right kidney. Under direct
ultrasound guidance, a 21 gauge needle was advanced into the renal
collecting system. Ultrasound image documentation was performed.
Aspiration of urine sample was performed followed by contrast
injection. A urine sample was sent for culture analysis.

A transitional dilator was advanced over a guidewire. Percutaneous
tract dilatation was then performed over the guidewire. A 10 -French
percutaneous nephrostomy tube was then advanced and formed in the
collecting system. Catheter position was confirmed by fluoroscopy
after contrast injection.

The catheter was secured at the skin with a Prolene retention suture
and Stat-Lock device. A gravity bag was placed.

COMPLICATIONS:
None.
FINDINGS: Ultrasound confirms significant right-sided hydronephrosis. After
lower pole access, aspiration yielded grossly purulent material from
the renal collecting system. A sample was sent for culture analysis.
A 10 French nephrostomy tube was placed and formed at the level of
the renal pelvis. The catheter is draining thick, purulent fluid
after placement. Output will be followed.
IMPRESSION: Placement of right-sided percutaneous nephrostomy tube. There is
evidence of gross pyonephrosis with purulent fluid present in the
renal collecting system. A sample was sent for culture analysis. A
10 French tube was formed in the renal pelvis and connected to
gravity bag drainage.

## 2016-10-24 IMAGING — CR DG CHEST 1V PORT
1 series · 1 of 1 positions shown · non-contrast
Comparison: Single view of the chest 01/06/2013

CLINICAL DATA: Altered mental status.

EXAM:
PORTABLE CHEST - 1 VIEW

[AP]
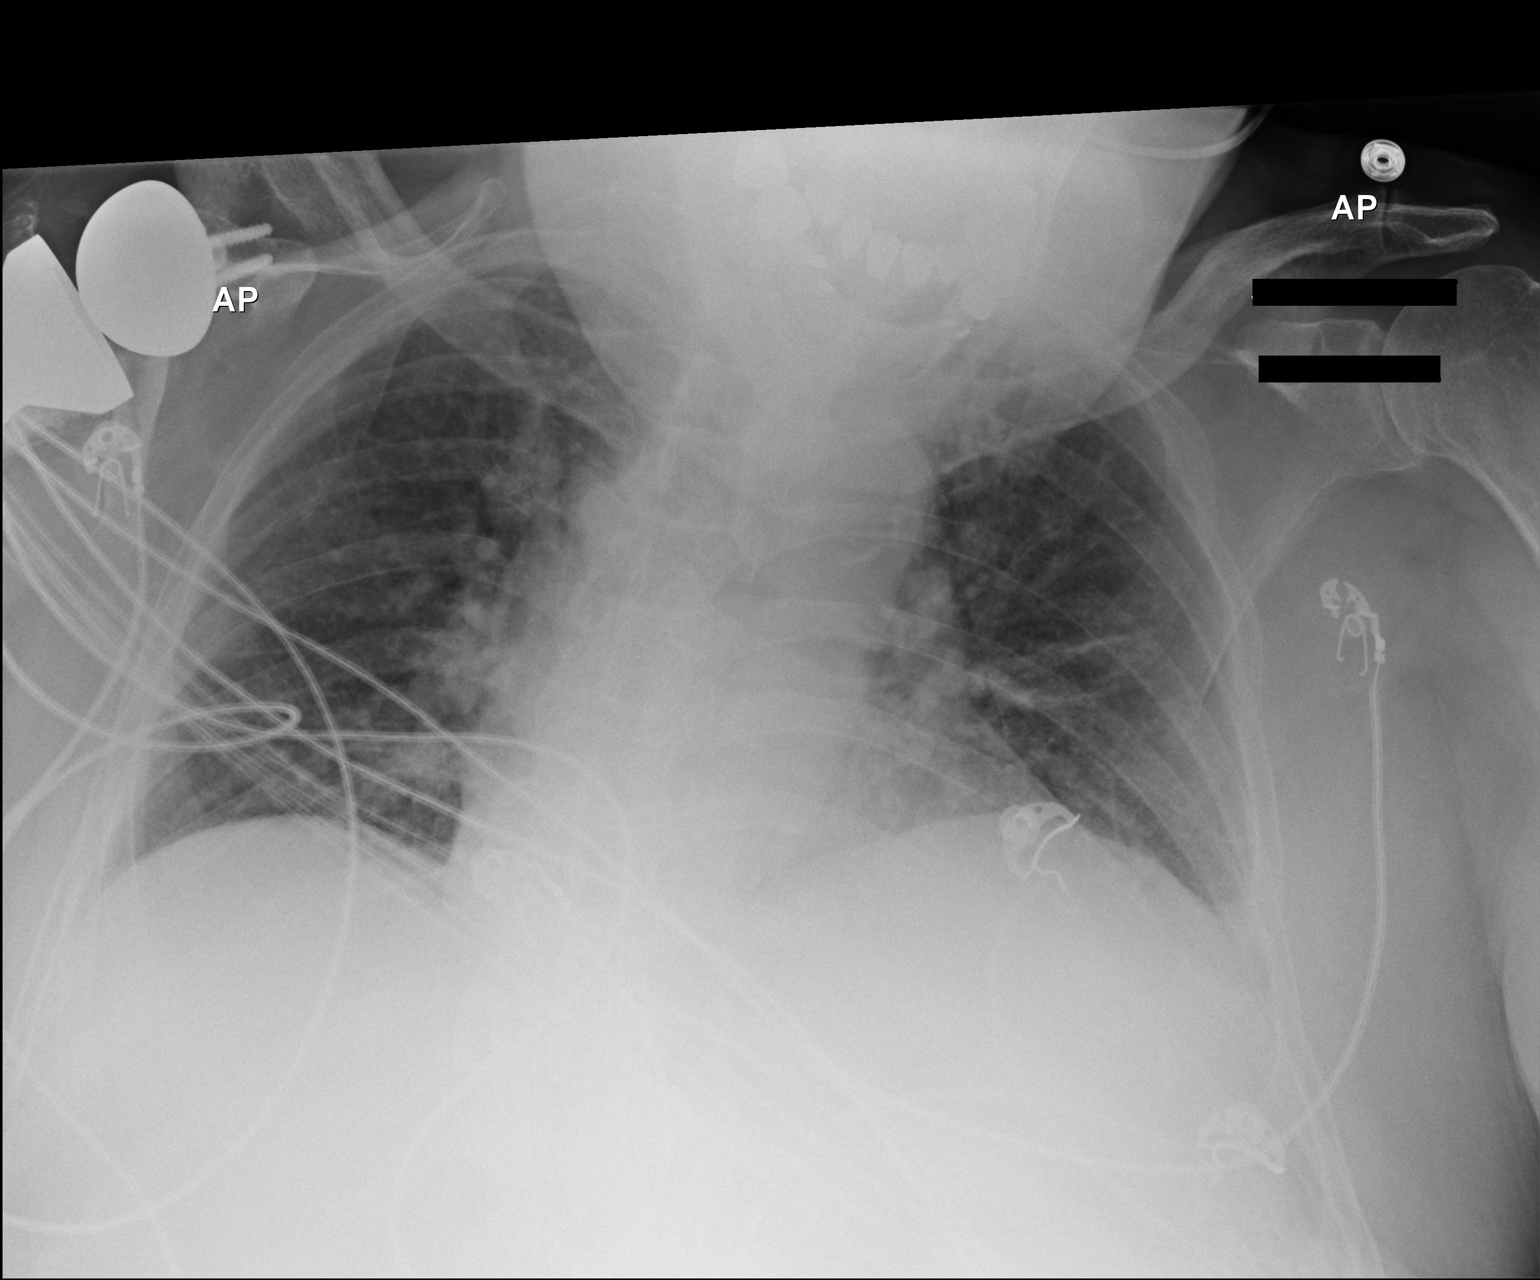

[1 of 1 positions shown; findings below may reference images not displayed]

FINDINGS: Lung volumes are low with mild basilar atelectasis. Small pleural
effusion is noted. Heart size is upper normal with vascular
congestion identified. No pneumothorax.
IMPRESSION: Pulmonary vascular congestion without frank edema. Very small left
effusion is noted.

## 2018-03-29 IMAGING — DX DG LUMBAR SPINE COMPLETE 4+V
5 series · 5 of 5 positions shown · non-contrast
Comparison: CT abdomen and pelvis 05/18/2014. Single-view of the
abdomen 05/03/2014.

CLINICAL DATA: Status post fall from a wheelchair today. Back pain.
History of prior surgery. Initial encounter.

EXAM:
LUMBAR SPINE - COMPLETE 4+ VIEW

[l-spine obl (1 of 2)]
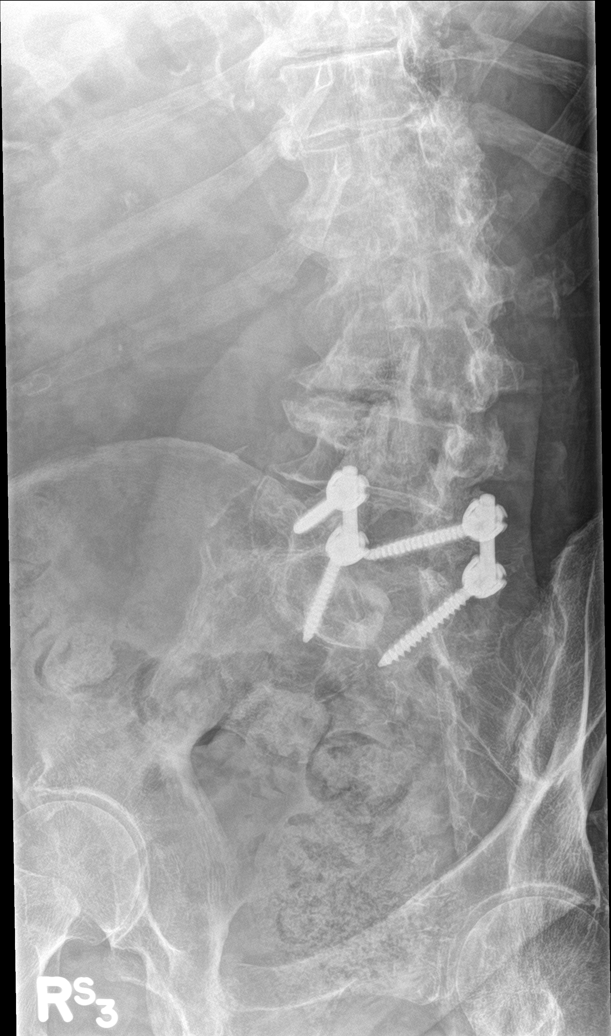

[l-spine obl (2 of 2)]
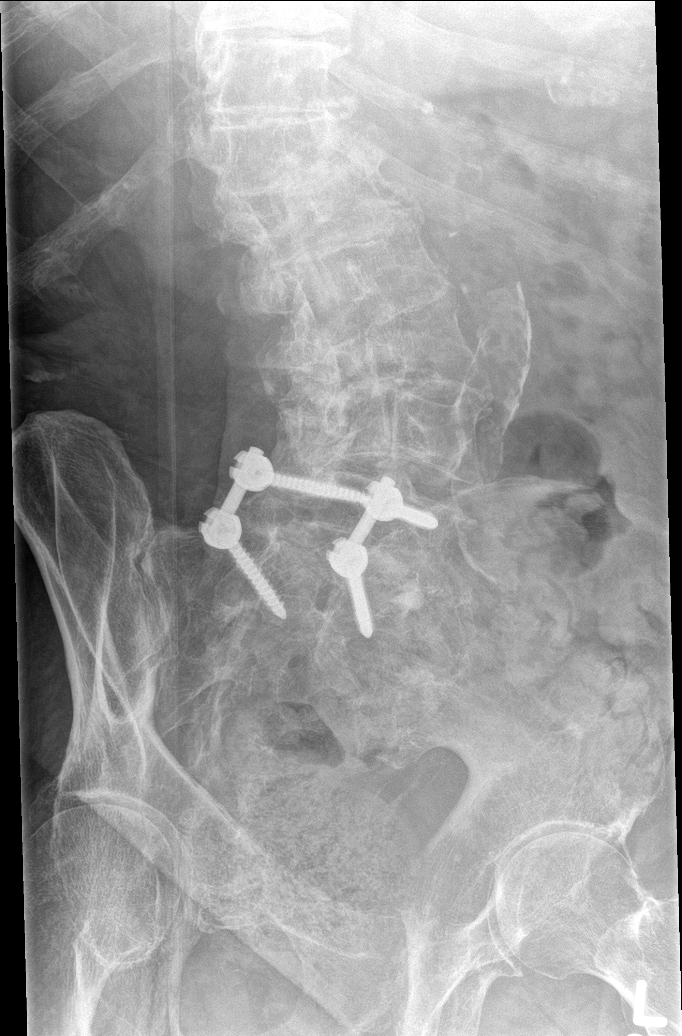

[l-spine lat]
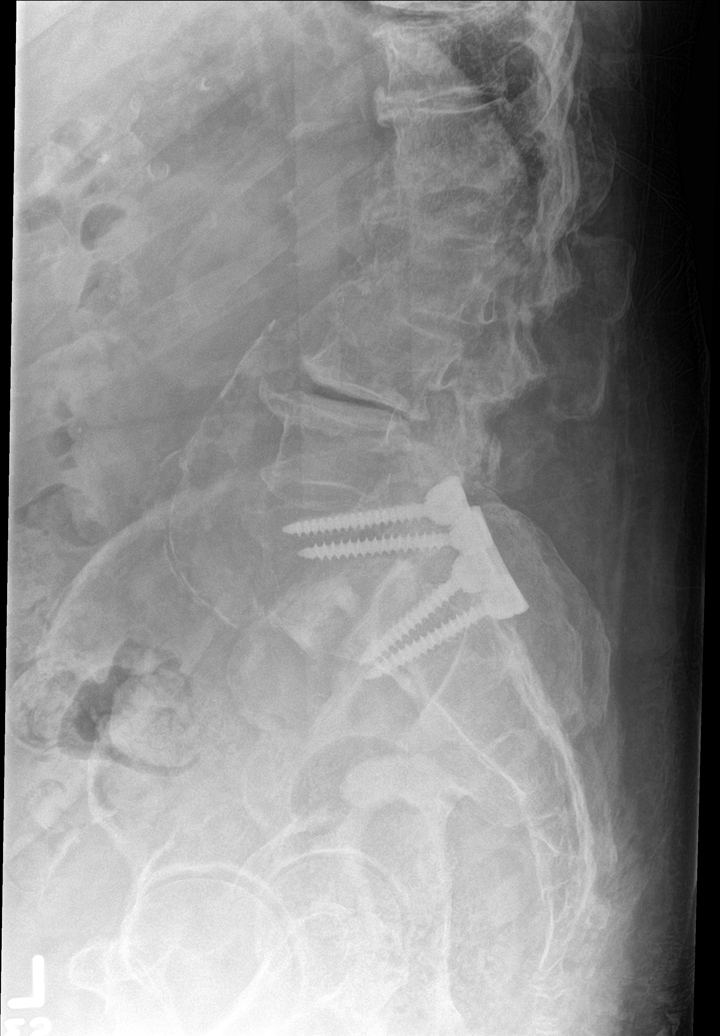

[l-spine spot]
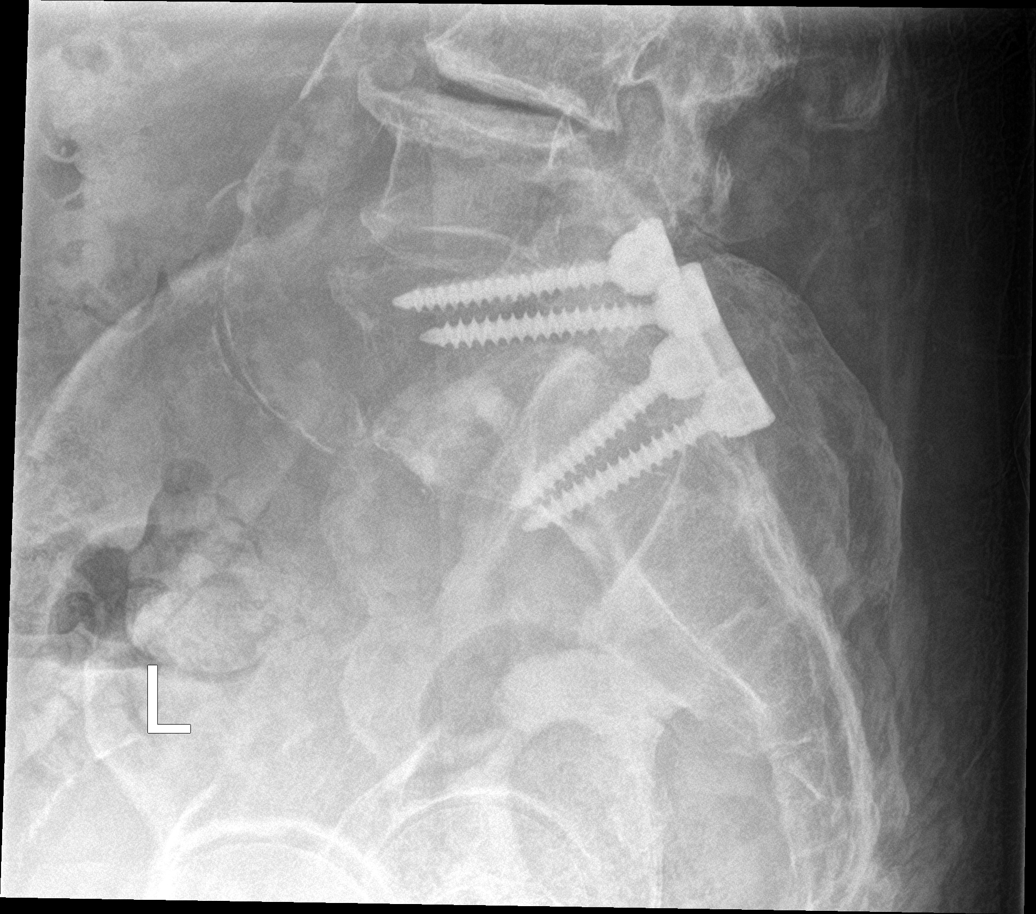

[l-spine ap]
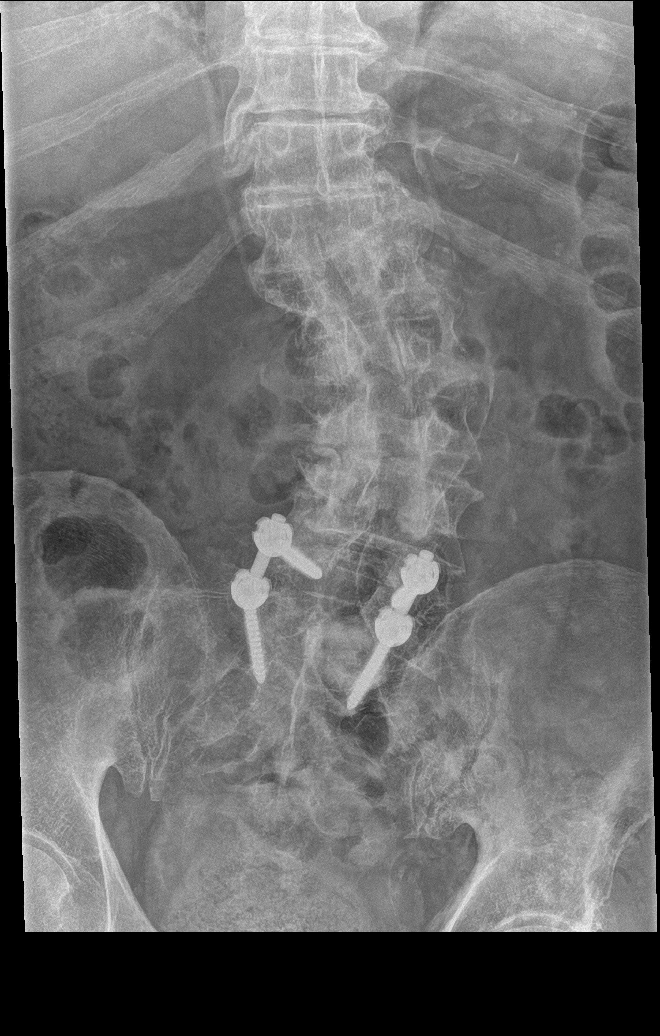

[5 of 5 positions shown; findings below may reference images not displayed]

FINDINGS: No acute abnormality is seen. Severe convex left scoliosis and
multilevel degenerative disease are again seen. The patient is
status post L5-S1 fusion. Anterolisthesis of L5 on S1 is unchanged.
Chronic compression fractures of T12, L1 and L2 are also unchanged.
Large stool ball in the rectum is noted. Aortic atherosclerosis is
seen.
IMPRESSION: No acute abnormality.

Severe multilevel spondylosis and convex left scoliosis.

Remote T12, L1 and L2 fractures.

Status post L5-S1 fusion.
